# Patient Record
Sex: Male | Born: 1935 | Race: White | Hispanic: No | Marital: Married | Smoking: Former smoker
Health system: Southern US, Community
[De-identification: ages and names within clinical notes are randomized; demographics above are authoritative.]

## PROBLEM LIST (undated history)

## (undated) DIAGNOSIS — K5730 Diverticulosis of large intestine without perforation or abscess without bleeding: Secondary | ICD-10-CM

## (undated) DIAGNOSIS — G309 Alzheimer's disease, unspecified: Secondary | ICD-10-CM

## (undated) DIAGNOSIS — I4891 Unspecified atrial fibrillation: Secondary | ICD-10-CM

## (undated) DIAGNOSIS — I639 Cerebral infarction, unspecified: Secondary | ICD-10-CM

## (undated) DIAGNOSIS — N400 Enlarged prostate without lower urinary tract symptoms: Secondary | ICD-10-CM

## (undated) DIAGNOSIS — I4892 Unspecified atrial flutter: Secondary | ICD-10-CM

## (undated) DIAGNOSIS — F0280 Dementia in other diseases classified elsewhere without behavioral disturbance: Secondary | ICD-10-CM

## (undated) HISTORY — DX: Diverticulosis of large intestine without perforation or abscess without bleeding: K57.30

## (undated) HISTORY — DX: Unspecified atrial flutter: I48.92

## (undated) HISTORY — DX: Benign prostatic hyperplasia without lower urinary tract symptoms: N40.0

## (undated) HISTORY — PX: OTHER SURGICAL HISTORY: SHX169

---
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
## 1976-04-19 HISTORY — PX: KIDNEY STONE SURGERY: SHX686

## 2002-06-07 HISTORY — PX: OTHER SURGICAL HISTORY: SHX169

## 2002-06-18 ENCOUNTER — Encounter: Payer: Self-pay | Admitting: Family Medicine

## 2003-06-15 ENCOUNTER — Emergency Department (HOSPITAL_COMMUNITY): Admission: AD | Admit: 2003-06-15 | Discharge: 2003-06-15 | Payer: Self-pay | Admitting: Family Medicine

## 2005-06-07 ENCOUNTER — Ambulatory Visit: Payer: Self-pay | Admitting: Family Medicine

## 2005-06-10 ENCOUNTER — Ambulatory Visit: Payer: Self-pay | Admitting: Family Medicine

## 2005-06-24 ENCOUNTER — Ambulatory Visit: Payer: Self-pay | Admitting: Family Medicine

## 2005-06-24 LAB — FECAL OCCULT BLOOD, GUAIAC: Fecal Occult Blood: NEGATIVE

## 2006-02-02 ENCOUNTER — Ambulatory Visit: Payer: Self-pay | Admitting: Family Medicine

## 2006-02-24 ENCOUNTER — Ambulatory Visit: Payer: Self-pay | Admitting: General Surgery

## 2006-02-24 ENCOUNTER — Other Ambulatory Visit: Payer: Self-pay

## 2006-03-02 ENCOUNTER — Ambulatory Visit: Payer: Self-pay | Admitting: Family Medicine

## 2006-03-04 ENCOUNTER — Ambulatory Visit: Payer: Self-pay | Admitting: Cardiology

## 2006-03-08 ENCOUNTER — Ambulatory Visit: Payer: Self-pay | Admitting: General Surgery

## 2006-03-08 HISTORY — PX: COLONOSCOPY: SHX174

## 2006-03-08 HISTORY — PX: HEMORRHOID SURGERY: SHX153

## 2006-04-19 HISTORY — PX: BACK SURGERY: SHX140

## 2006-12-15 ENCOUNTER — Ambulatory Visit: Payer: Self-pay | Admitting: Family Medicine

## 2006-12-15 DIAGNOSIS — K5730 Diverticulosis of large intestine without perforation or abscess without bleeding: Secondary | ICD-10-CM | POA: Insufficient documentation

## 2006-12-15 DIAGNOSIS — M159 Polyosteoarthritis, unspecified: Secondary | ICD-10-CM

## 2006-12-15 DIAGNOSIS — N400 Enlarged prostate without lower urinary tract symptoms: Secondary | ICD-10-CM

## 2006-12-15 LAB — CONVERTED CEMR LAB
Basophils Absolute: 0 10*3/uL (ref 0.0–0.1)
Basophils Relative: 0.5 % (ref 0.0–1.0)
Eosinophils Absolute: 0.3 10*3/uL (ref 0.0–0.6)
Eosinophils Relative: 3.2 % (ref 0.0–5.0)
HCT: 42.5 % (ref 39.0–52.0)
Hemoglobin: 14.6 g/dL (ref 13.0–17.0)
Lymphocytes Relative: 20.6 % (ref 12.0–46.0)
MCV: 92 fL (ref 78.0–100.0)
Monocytes Absolute: 0.9 10*3/uL — ABNORMAL HIGH (ref 0.2–0.7)
Platelets: 303 10*3/uL (ref 150–400)
RBC: 4.62 M/uL (ref 4.22–5.81)
RDW: 12.3 % (ref 11.5–14.6)
Rhuematoid fact SerPl-aCnc: <20 intl units/mL — ABNORMAL LOW (ref 0.0–20.0)
Sed Rate: 16 mm/hr (ref 0–20)

## 2006-12-20 ENCOUNTER — Telehealth (INDEPENDENT_AMBULATORY_CARE_PROVIDER_SITE_OTHER): Payer: Self-pay | Admitting: *Deleted

## 2006-12-20 LAB — CONVERTED CEMR LAB
ANA Titer 1: 1:160 {titer} — ABNORMAL HIGH
Anti Nuclear Antibody(ANA): POSITIVE — AB

## 2007-01-05 ENCOUNTER — Encounter: Payer: Self-pay | Admitting: Family Medicine

## 2007-06-20 ENCOUNTER — Encounter: Payer: Self-pay | Admitting: Family Medicine

## 2007-06-28 ENCOUNTER — Encounter: Payer: Self-pay | Admitting: Rheumatology

## 2007-06-29 ENCOUNTER — Encounter: Payer: Self-pay | Admitting: Family Medicine

## 2007-07-05 ENCOUNTER — Ambulatory Visit: Payer: Self-pay | Admitting: Rheumatology

## 2007-07-07 ENCOUNTER — Other Ambulatory Visit: Payer: Self-pay

## 2007-07-07 ENCOUNTER — Ambulatory Visit: Payer: Self-pay | Admitting: Unknown Physician Specialty

## 2009-02-03 ENCOUNTER — Ambulatory Visit: Payer: Self-pay | Admitting: Family Medicine

## 2009-02-03 LAB — CONVERTED CEMR LAB
ALT: 26 units/L (ref 0–53)
BUN: 19 mg/dL (ref 6–23)
Basophils Absolute: 0 10*3/uL (ref 0.0–0.1)
CO2: 30 meq/L (ref 19–32)
Chloride: 103 meq/L (ref 96–112)
Cholesterol: 163 mg/dL (ref 0–200)
Creatinine, Ser: 1.6 mg/dL — ABNORMAL HIGH (ref 0.4–1.5)
Creatinine,U: 111.4 mg/dL
Eosinophils Relative: 8.2 % — ABNORMAL HIGH (ref 0.0–5.0)
GFR calc non Af Amer: 44.49 mL/min (ref >60)
Glucose, Bld: 101 mg/dL — ABNORMAL HIGH (ref 70–99)
LDL Cholesterol: 101 mg/dL — ABNORMAL HIGH (ref 0–99)
Lymphocytes Relative: 29.1 % (ref 12.0–46.0)
MCHC: 33.4 g/dL (ref 30.0–36.0)
Microalb Creat Ratio: 2.7 mg/g (ref 0.0–30.0)
Microalb, Ur: 0.3 mg/dL (ref 0.0–1.9)
Neutro Abs: 4 10*3/uL (ref 1.4–7.7)
Neutrophils Relative %: 53.4 % (ref 43.0–77.0)
Platelets: 200 10*3/uL (ref 150.0–400.0)
Potassium: 5 meq/L (ref 3.5–5.1)
RBC: 5.06 M/uL (ref 4.22–5.81)
Total Protein: 7.4 g/dL (ref 6.0–8.3)
VLDL: 13.8 mg/dL (ref 0.0–40.0)

## 2009-02-04 ENCOUNTER — Telehealth: Payer: Self-pay | Admitting: Family Medicine

## 2009-02-04 LAB — CONVERTED CEMR LAB: Vit D, 25-Hydroxy: 34 ng/mL (ref 30–89)

## 2009-02-05 ENCOUNTER — Ambulatory Visit: Payer: Self-pay | Admitting: Internal Medicine

## 2009-02-05 ENCOUNTER — Inpatient Hospital Stay (HOSPITAL_COMMUNITY): Admission: EM | Admit: 2009-02-05 | Discharge: 2009-02-07 | Payer: Self-pay | Admitting: Emergency Medicine

## 2009-02-05 ENCOUNTER — Ambulatory Visit: Payer: Self-pay | Admitting: Family Medicine

## 2009-02-05 DIAGNOSIS — R413 Other amnesia: Secondary | ICD-10-CM

## 2009-02-06 ENCOUNTER — Encounter: Payer: Self-pay | Admitting: Internal Medicine

## 2009-02-06 ENCOUNTER — Encounter: Payer: Self-pay | Admitting: Cardiology

## 2009-02-07 HISTORY — PX: ATRIAL FLUTTER ABLATION: SHX5733

## 2009-02-10 ENCOUNTER — Ambulatory Visit: Payer: Self-pay | Admitting: Internal Medicine

## 2009-02-10 ENCOUNTER — Telehealth: Payer: Self-pay | Admitting: Family Medicine

## 2009-02-10 LAB — CONVERTED CEMR LAB: POC INR: 2.8

## 2009-02-14 ENCOUNTER — Ambulatory Visit: Payer: Self-pay | Admitting: Cardiology

## 2009-02-14 LAB — CONVERTED CEMR LAB: POC INR: 3.1

## 2009-02-17 ENCOUNTER — Ambulatory Visit: Payer: Self-pay | Admitting: Internal Medicine

## 2009-02-20 ENCOUNTER — Ambulatory Visit: Payer: Self-pay | Admitting: Cardiology

## 2009-02-20 LAB — CONVERTED CEMR LAB: POC INR: 3

## 2009-03-06 ENCOUNTER — Encounter: Payer: Self-pay | Admitting: Internal Medicine

## 2009-03-17 ENCOUNTER — Ambulatory Visit: Payer: Self-pay | Admitting: Internal Medicine

## 2009-04-07 ENCOUNTER — Telehealth: Payer: Self-pay | Admitting: Family Medicine

## 2009-04-19 HISTORY — PX: TRANSURETHRAL RESECTION OF PROSTATE: SHX73

## 2009-05-06 ENCOUNTER — Encounter: Payer: Self-pay | Admitting: Family Medicine

## 2009-05-20 ENCOUNTER — Encounter: Payer: Self-pay | Admitting: Family Medicine

## 2009-11-20 ENCOUNTER — Encounter (INDEPENDENT_AMBULATORY_CARE_PROVIDER_SITE_OTHER): Payer: Self-pay | Admitting: *Deleted

## 2010-01-28 ENCOUNTER — Telehealth: Payer: Self-pay | Admitting: Family Medicine

## 2010-02-10 ENCOUNTER — Ambulatory Visit: Payer: Self-pay | Admitting: Family Medicine

## 2010-02-10 DIAGNOSIS — E7800 Pure hypercholesterolemia, unspecified: Secondary | ICD-10-CM

## 2010-02-10 LAB — CONVERTED CEMR LAB
ALT: 22 units/L (ref 0–53)
Albumin: 4.2 g/dL (ref 3.5–5.2)
Alkaline Phosphatase: 93 units/L (ref 39–117)
BUN: 15 mg/dL (ref 6–23)
Basophils Absolute: 0.1 10*3/uL (ref 0.0–0.1)
Basophils Relative: 0.6 % (ref 0.0–3.0)
Bilirubin, Direct: 0.1 mg/dL (ref 0.0–0.3)
Eosinophils Absolute: 0.5 10*3/uL (ref 0.0–0.7)
Eosinophils Relative: 5.7 % — ABNORMAL HIGH (ref 0.0–5.0)
GFR calc non Af Amer: 49.72 mL/min (ref >60)
Hemoglobin: 15.2 g/dL (ref 13.0–17.0)
Lymphs Abs: 2.4 10*3/uL (ref 0.7–4.0)
MCHC: 34.1 g/dL (ref 30.0–36.0)
Microalb, Ur: 0.4 mg/dL (ref 0.0–1.9)
Monocytes Absolute: 0.7 10*3/uL (ref 0.1–1.0)
Monocytes Relative: 8.5 % (ref 3.0–12.0)
PSA: 1.63 ng/mL (ref 0.10–4.00)
Platelets: 222 10*3/uL (ref 150.0–400.0)
RBC: 4.64 M/uL (ref 4.22–5.81)
RDW: 12.9 % (ref 11.5–14.6)
Sodium: 138 meq/L (ref 135–145)
TSH: 1.47 microintl units/mL (ref 0.35–5.50)
Total Protein: 6.9 g/dL (ref 6.0–8.3)
Triglycerides: 100 mg/dL (ref 0.0–149.0)
WBC: 8.4 10*3/uL (ref 4.5–10.5)

## 2010-02-12 ENCOUNTER — Ambulatory Visit: Payer: Self-pay | Admitting: Family Medicine

## 2010-02-12 DIAGNOSIS — D409 Neoplasm of uncertain behavior of male genital organ, unspecified: Secondary | ICD-10-CM

## 2010-02-12 DIAGNOSIS — N529 Male erectile dysfunction, unspecified: Secondary | ICD-10-CM

## 2010-02-16 ENCOUNTER — Telehealth: Payer: Self-pay | Admitting: Family Medicine

## 2010-03-30 ENCOUNTER — Encounter: Payer: Self-pay | Admitting: Family Medicine

## 2010-03-30 LAB — CONVERTED CEMR LAB: Creatinine, Ser: 1.42 mg/dL

## 2010-04-06 ENCOUNTER — Ambulatory Visit: Payer: Self-pay | Admitting: Anesthesiology

## 2010-04-07 ENCOUNTER — Ambulatory Visit: Payer: Self-pay | Admitting: General Surgery

## 2010-04-07 ENCOUNTER — Encounter: Payer: Self-pay | Admitting: Family Medicine

## 2010-04-07 HISTORY — PX: HERNIA REPAIR: SHX51

## 2010-04-10 ENCOUNTER — Encounter: Payer: Self-pay | Admitting: Family Medicine

## 2010-05-19 NOTE — Letter (Signed)
Summary: Dr.Chester Haworth,[facility],Note  Dr.Chester Haworth,[facility],Note   Imported By: Beau Fanny 05/21/2009 13:46:37  _____________________________________________________________________  External Attachment:    Type:   Image     Comment:   External Document

## 2010-05-19 NOTE — Letter (Signed)
Summary: Nadara Eaton letter  [place] [facility]  [address] [place], [place] [address]   Phone: [phone]  Fax: [phone]       11/20/2009 MRN: [id number]  [facility] [address] [place], [place]  [address]  Dear Mr. Kennieth Rad,  [facility] [place], and [facility] announce the retirement of Arta Silence, M.D., from full-time practice at the [facility] office effective October 16, 2009 and his plans of returning part-time.  It is important to Dr. Hetty Ely and to our practice that you understand that [facility] has seven physicians in our office for your health care needs.  We will continue to offer the same exceptional care that you have today.    Dr. Hetty Ely has spoken to many of you about his plans for retirement and returning part-time in the fall.   We will continue to work with you through the transition to schedule appointments for you in the office and meet the high standards that [place] [facility] is committed to.   Again, it is with great pleasure that we share the news that Dr. Hetty Ely will return to [facility] in October of 2011 with a reduced schedule.    If you have any questions, or would like to request an appointment with one of our physicians, please call us at [phone] and press the option for Scheduling an appointment.  We take pleasure in providing you with excellent patient care and look forward to seeing you at your next office visit.  Our [facility] Physicians are:  Tillman Abide, M.D. Laurita Quint, M.D. Roxy Manns, M.D. Kerby Nora, M.D. Hannah Beat, M.D. Ruthe Mannan, M.D. We proudly welcomed Raechel Ache, M.D. and Eustaquio Boyden, M.D. to the practice in July/August 2011.  Sincerely,  [facility]

## 2010-05-19 NOTE — Consult Note (Signed)
Summary: Dr.Chester Haworth,[facility],Note  Dr.Chester Haworth,[facility],Note   Imported By: Beau Fanny 05/07/2009 09:31:46  _____________________________________________________________________  External Attachment:    Type:   Image     Comment:   External Document

## 2010-05-19 NOTE — Progress Notes (Signed)
Summary: wants referral to specialist  Phone Note Call from Patient Call back at Home Phone [phone] Call back at [phone]   Caller: Patient Call For: Shaune Leeks MD Summary of Call: Pt says he has a problem with his rectum that you are aware of and he would like to be referred to a specialist about this.  He prefers to see someone in [place]. Initial call taken by: Lowella Petties CMA, AAMA,  February 16, 2010 10:22 AM  Follow-up for Phone Call        Pls ask pt to return to see Dr Lemar Livings again. If he prefers not to go, pls refer to Dr Mechele Collin, GI in [place]. Follow-up by: Shaune Leeks MD,  February 18, 2010 7:15 AM  Additional Follow-up for Phone Call Additional follow up Details #1::        [facility] for pt to call back.             Lowella Petties CMA, AAMA  February 18, 2010 10:53 AM  Advised pt, he agreed to that plan.   Lowella Petties CMA, AAMA  February 18, 2010 12:28 PM

## 2010-05-19 NOTE — Progress Notes (Signed)
Summary: wants PSA checked  Phone Note Call from Patient Call back at Home Phone [phone]   Caller: Patient Call For: Shaune Leeks MD Summary of Call: Pt requests to have his PSA checked with his cpx labs on 10/25. Initial call taken by: Lowella Petties CMA,  January 28, 2010 4:40 PM  Follow-up for Phone Call        Noted. Follow-up by: Shaune Leeks MD,  January 28, 2010 4:49 PM

## 2010-05-19 NOTE — Assessment & Plan Note (Signed)
Summary: CPX   Vital Signs:  Patient profile:   77 year old male Weight:      183 pounds BMI:     24.91 Temp:     97.3 degrees F oral Pulse rate:   76 / minute Pulse rhythm:   regular BP sitting:   110 / 70  (left arm) Cuff size:   large  Vitals Entered By: Sydell Axon LPN (2010-02-20 2:01 PM) CC: 30 Minute medicare physical  Vision Screening:Left eye w/o correction: 20 / 25 Right Eye w/o correction: 20 / 40 Both eyes w/o correction:  20/ 25       25db HL: Left  500 hz: No Response 1000 hz: No Response 2000 hz: No Response 4000 hz: No Response Right  500 hz: 25db 1000 hz: 25db 2000 hz: No Response    History of Present Illness: Pt here for Medicare PE. He had a colonoscopy supposedly by Dr Lemar Livings  last year. I have not gotten that note. He has not had a regular BM since that time. He had significant reaction to the anesthesia. His stomach has lots of loose stools and his stool diameter is very small. He has also been seen by Neurology a few times for dementia and was found to have significant atrophy by head MRI. Was not felt to have Alzheimer's. Has lesion on penis he would like checked. It causes him to have a split stream on urination. He has sold his [employer] recently.  Preventive Screening-Counseling & Management  Alcohol-Tobacco     Alcohol drinks/day: <1     Alcohol type: wine, beer      Smoking Status: quit     Packs/Day: 2/3      Year Started: 1951     Year Quit: 1975     Pack years: 20  Caffeine-Diet-Exercise     Caffeine use/day: 2     Does Patient Exercise: yes     Type of exercise: gym     Exercise (avg: min/session): >60     Times/week: 3-4  Problems Prior to Update: 1)  Hypercholesterolemia  (ICD-272.0) 2)  Memory Loss  (ICD-780.93) 3)  Trochanteric Bursitis, Left  (ICD-726.5) 4)  Hyperglycemia, 104  (ICD-790.29) 5)  Benign Prostatic Hypertrophy  (ICD-600.00) 6)  Diverticulosis, Colon  (ICD-562.10) 7)  Osteoarthritis,  Generalized, Multiple Joints  (ICD-715.09)  Medications Prior to Update: 1)  None  Allergies: No Known Drug Allergies  Past History:  Past Medical History: Last updated: 02/17/2009 Diverticulosis, colon Benign prostatic hypertrophy Aflutter s/p rfca  Past Surgical History: Last updated: 03/27/2009 Anal Fissure [id number] Kidney Stone 1978 MRI INT auit canals wnl except Mastoiditis , left 05/21/02 Audiol eval  06/07/02 Colonoscopy, 2 polyps, divertics, int. ext hemorrhoids    03/08/2006           10 years Hemorrhoidectomy (Byrnett) 03/08/06 BAck Surgery HOSP [facility]   Aflutter Ablation  10/20-10/22/2010 EP Study w/ Ablation 02/06/2009 TEE EF 55% 02/06/2009  Family History: Last updated: 02-20-10 Father: Died at age 95 with pneumonia and arthritis, with a knee replacement Mother: Died at age 72 in a fire Siblings: Two brothers, two sisters, one sister brain tumor/removed CV - none HBP - none DM - none Breast/Ovarian/Uterine cancer - none Depression - none ETOH/Drug Abuse - none Stroke - none  Family History Thyroid disease  Social History: Last updated: 12/15/2006 Marital Status: Married Children: 3 Occupation: Landscape architect in [place]  Risk Factors: Alcohol Use: <1 (February 20, 2010) Caffeine Use: 2 (Feb 20, 2010) Exercise:  yes (02/12/2010)  Risk Factors: Smoking Status: quit (02/12/2010) Packs/Day: 2/3  (02/12/2010)  Family History: Father: Died at age 110 with pneumonia and arthritis, with a knee replacement Mother: Died at age 74 in a fire Siblings: Two brothers, two sisters, one sister brain tumor/removed CV - none HBP - none DM - none Breast/Ovarian/Uterine cancer - none Depression - none ETOH/Drug Abuse - none Stroke - none  Family History Thyroid disease  Review of Systems General:  Denies chills, fatigue, fever, sweats, weakness, and weight loss. Eyes:  Denies blurring, discharge, and eye pain. ENT:  Complains of decreased hearing;  denies earache and ringing in ears. CV:  Denies chest pain or discomfort, difficulty breathing while lying down, fainting, fatigue, palpitations, shortness of breath with exertion, swelling of feet, and swelling of hands. Resp:  Complains of cough; denies shortness of breath and wheezing; with PND. GI:  Denies abdominal pain, bloody stools, change in bowel habits, constipation, dark tarry stools, diarrhea, indigestion, loss of appetite, nausea, vomiting, vomiting blood, and yellowish skin color; see HPI, not hsard stools, not soft but smaller loose BMs.. GU:  Denies discharge, dysuria, nocturia, and urinary frequency. MS:  Denies joint pain, joint swelling, muscle, and cramps; some muscle discomfort with arthritis...evaled by Dr Gavin Potters.. Derm:  Denies dryness, itching, and rash. Neuro:  Denies numbness, poor balance, tingling, and tremors.  Physical Exam  General:  Well-developed,well-nourished,in no acute distress; alert,appropriate and cooperative throughout examination, speaks in what often sounds like jibberish but gets his point across. Head:  Normocephalic and atraumatic without obvious abnormalities. No apparent alopecia or balding. Eyes:  Conjunctiva clear bilaterally.  Ears:  External ear exam shows no significant lesions or deformities.  Otoscopic examination reveals clear canals, tympanic membranes are intact bilaterally without bulging, retraction, inflammation or discharge. Hearing is grossly normal bilaterally. Nose:  External nasal examination shows no deformity or inflammation. Nasal mucosa are pink and moist without lesions or exudates. Mouth:  Oral mucosa and oropharynx without lesions or exudates.  Teeth in good repair. Neck:  No deformities, masses, or tenderness noted. Chest Wall:  No deformities, masses, tenderness or gynecomastia noted. Breasts:  No masses or gynecomastia noted Lungs:  Normal respiratory effort, chest expands symmetrically. Lungs are clear to  auscultation, no crackles or wheezes. Heart:  Increased, very fast rate when ausculta5ted but nml rate whern checked in. S1 and S2, no detectable murmur to my ear. Abdomen:  Bowel sounds positive,abdomen soft and non-tender without masses, organomegaly or hernias noted. Rectal:  No external abnormalities noted. Normal sphincter tone. No rectal masses or tenderness. G neg. Genitalia:  Testes bilaterally descended without nodularity, tenderness or masses. No scrotal masses or lesions. No penis lesions or urethral discharge. Urethra with "string" or adhesion across the meatus. No retraction seen. Msk:  No deformity or scoliosis noted of thoracic or lumbar spine.  Mild decreased ROM shoulders. Pulses:  R and L carotid,radial,femoral,dorsalis pedis and posterior tibial pulses are full and equal bilaterally Extremities:  No clubbing, cyanosis, edema, or deformity noted with normal full range of motion of all joints.   Neurologic:  No cranial nerve deficits noted. Station and gait are normal. Sensory, motor and coordinative functions appear intact. Memory appears intact but suspect at times. He seems confused with word finding or thoughtful flow at times but generally is understandable. Skin:  Intact without suspicious lesions or rashes Cervical Nodes:  No lymphadenopathy noted Inguinal Nodes:  No significant adenopathy Psych:  Cognition and judgment appear intact. Alert and cooperative with normal  attention span and concentration. No apparent delusions, illusions, hallucinations   Impression & Recommendations:  Problem # 1:  HEALTH SCREENING (ICD-V70.0)  Reviewed preventive care protocols, scheduled due services, and updated immunizations. I have personally reviewed the Medicare Annual Wellness questionnaire and have noted 1.   The patient's medical and social history 2.   Their use of alcohol, tobacco or illicit drugs 3.   Their current medications and supplements 4.   The patient's functional  ability including ADL's, fall risks, home safety risks and hearing or visual             impairment. 5.   Diet and physical activities 6.   Evidence for depression or mood disorders  Orders: Medicare -1st Annual Wellness Visit [id number])  Problem # 2:  PENILE LESION (ICD-236.6) Assessment: New New complaint but old problem  in that he had this upon admission physical to the service but had nothing done about it. Would like to have something done now. 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 Will try Viagra. His updated medication list for this problem includes:    Viagra 100 Mg Tabs (Sildenafil citrate) .[facility]... 1/2-1 tab by mouth 1 hour before desired intercourse  Orders: Prescription Created Electronically [phone])  Problem # 4:  HYPERCHOLESTEROLEMIA (ICD-272.0) Assessment: Unchanged Adequate. Labs Reviewed: SGOT: 32 (02/10/2010)   SGPT: 22 (02/10/2010)   HDL:50.10 (02/10/2010), 48.10 (02/03/2009)  LDL:94 (02/10/2010), 101 (84/13/2440)  Chol:164 (02/10/2010), 163 (02/03/2009)  Trig:100.0 (02/10/2010), 69.0 (02/03/2009)  Problem # 5:  MEMORY LOSS (ICD-780.93) Assessment: Unchanged Seems stable.  Problem # 6:  HYPERGLYCEMIA, 104 (ICD-790.29) Assessment: Improved  Normalized today. Will remove from problem list as glucose very nml today.  Labs Reviewed: Creat: 1.5 (02/10/2010)     Problem # 7:  BENIGN PROSTATIC HYPERTROPHY (ICD-600.00) Assessment: Unchanged Acceptable sxs.   Complete Medication List: 1)  Namenda 10 Mg Tabs (Memantine hcl) .... Take one by mouth two times a day 2)  Donepezil Hcl 10 Mg Tabs (Donepezil hcl) .... Take one by mouth daily 3)  Viagra 100 Mg Tabs (Sildenafil citrate) .... 1/2-1 tab by mouth 1 hour before desired intercourse  Other Orders: Audiometry [phone]) Vision Screening (53664)  Patient Instructions: 1)  Try Citrucel  1 tsp every AM in 8 oz of water.  2)  Refer to Urology. 3)  Suggest getting Audology eval. Prescriptions: VIAGRA 100 MG TABS (SILDENAFIL CITRATE) 1/2-1 tab by mouth 1 hour before desired intercourse  #10 x 12   Entered and Authorized by:   Shaune Leeks MD   Signed by:   Shaune Leeks MD on 02/12/2010   Method used:   Electronically to        [facility]       [address]       [place], [place]  [address]       Ph: [phone]       Fax: [phone]   RxID:   [id number]    Orders Added: 1)  Audiometry [92552] 2)  Vision Screening [35573] 3)  Urology Referral [Urology] 4)  Medicare -1st Annual Wellness Visit [G0438] 5)  Prescription Created Electronically [G8553] 6)  Medicare -1st Annual Wellness Visit [G0438]    Current Allergies (reviewed today): No known allergies   Appended Document: CPX Pt declines hearing problem altho machine documented no hearing on the left, decreased  significantly on the right. He could hear adequately while in the exam room with ears covered at different times.

## 2010-05-21 NOTE — Op Note (Signed)
Summary: Excision of hemorrhoidal complex of the 2:00 and 7:00 positions,  Excision of hemorrhoidal complex of the 2:00 and 7:00 positions,[facility]   Imported By: Beau Fanny 04/08/2010 15:10:43  _____________________________________________________________________  External Attachment:    Type:   Image     Comment:   External Document  Appended Document: Excision of hemorrhoidal complex of the 2:00 and 7:00 positions,    Clinical Lists Changes  Observations: Added new observation of PAST SURG HX: Anal Fissure [id number] Kidney Stone 1978 MRI INT auit canals wnl except Mastoiditis , left 05/21/02 Audiol eval  06/07/02 Colonoscopy, 2 polyps, divertics, int. ext hemorrhoids    03/08/2006           10 years Hemorrhoidectomy (Byrnett) 03/08/06 BAck Surgery HOSP [facility]   Aflutter Ablation  10/20-10/22/2010 EP Study w/ Ablation 02/06/2009 TEE EF 55% 02/06/2009 HEMM EXCISION (DR BYRNETT) 04/07/2010 (04/08/2010 22:03)       Past Surgical History:    Anal Fissure [id number]    Kidney Stone 1978    MRI INT auit canals wnl except Mastoiditis , left 05/21/02    Audiol eval  06/07/02    Colonoscopy, 2 polyps, divertics, int. ext hemorrhoids    03/08/2006           10 years    Hemorrhoidectomy (Byrnett) 03/08/06    BAck Surgery    HOSP [facility]   Aflutter Ablation  10/20-10/22/2010    EP Study w/ Ablation 02/06/2009    TEE EF 55% 02/06/2009    HEMM EXCISION (DR BYRNETT) 04/07/2010

## 2010-07-23 LAB — APTT: aPTT: 30 seconds (ref 24–37)

## 2010-07-23 LAB — COMPREHENSIVE METABOLIC PANEL
AST: 34 U/L (ref 0–37)
BUN: 18 mg/dL (ref 6–23)
Calcium: 9.2 mg/dL (ref 8.4–10.5)
Chloride: 107 mEq/L (ref 96–112)
GFR calc Af Amer: >60 mL/min (ref >60)
GFR calc non Af Amer: 54 mL/min — ABNORMAL LOW (ref >60)
Total Bilirubin: 0.6 mg/dL (ref 0.3–1.2)

## 2010-07-23 LAB — DIFFERENTIAL
Basophils Absolute: 0.1 10*3/uL (ref 0.0–0.1)
Basophils Relative: 1 % (ref 0–1)
Eosinophils Absolute: 0.4 10*3/uL (ref 0.0–0.7)
Eosinophils Relative: 4 % (ref 0–5)
Monocytes Absolute: 0.6 10*3/uL (ref 0.1–1.0)
Neutro Abs: 5.5 10*3/uL (ref 1.7–7.7)

## 2010-07-23 LAB — CBC
HCT: 45.2 % (ref 39.0–52.0)
Hemoglobin: 15.4 g/dL (ref 13.0–17.0)
MCHC: 34.4 g/dL (ref 30.0–36.0)
MCV: 95 fL (ref 78.0–100.0)
RBC: 4.76 MIL/uL (ref 4.22–5.81)
RDW: 13.7 % (ref 11.5–15.5)
WBC: 8.9 10*3/uL (ref 4.0–10.5)
WBC: 9.3 10*3/uL (ref 4.0–10.5)

## 2010-07-23 LAB — CARDIAC PANEL(CRET KIN+CKTOT+MB+TROPI)
CK, MB: 8.9 ng/mL — ABNORMAL HIGH (ref 0.3–4.0)
Relative Index: 1.7 (ref 0.0–2.5)
Total CK: 366 U/L — ABNORMAL HIGH (ref 7–232)
Troponin I: 0.02 ng/mL (ref 0.00–0.06)

## 2010-07-23 LAB — PROTIME-INR
INR: 1.09 (ref 0.00–1.49)
INR: 1.15 (ref 0.00–1.49)
Prothrombin Time: 14 seconds (ref 11.6–15.2)
Prothrombin Time: 14.3 seconds (ref 11.6–15.2)
Prothrombin Time: 14.6 seconds (ref 11.6–15.2)

## 2010-07-23 LAB — MAGNESIUM: Magnesium: 2.3 mg/dL (ref 1.5–2.5)

## 2010-09-04 NOTE — Letter (Signed)
February 02, 2006     Kelly Lloyd  [address], [address]  [place], [place] [address]   RE:  Kelly, Lloyd  MRN:  [id number]  /  DOB:  1928-01-19   Dear [name]:   This is to introduce Kelly Lloyd, a 77 year old white male who is  excruciatingly healthy but has problems with external hemorrhoids which  causes routine discharge and he would like it evaluated and possibly  treated.  It has been bothering him for quite some time.  He is a very avid  motorcycle rider, has been to [place] recently in [place].   PAST MEDICAL HISTORY:  1. BPH.  2. Elevated cholesterol.  3. Mildly elevated glucose.   MEDICATIONS:  He is on no medications regularly.  He is very healthy and  stays active.   ALLERGIES:  NO KNOWN DRUG ALLERGIES.   PHYSICAL EXAMINATION:  He has what appears to be deflated external  hemorrhoidal tissue at the 12 o'clock position.  Otherwise, normal.  He has  had an acute bout of gastroenteritis recently which is now resolved.  He has  no other medical problems of which I am aware.           I appreciate you seeing him and look forward to your evaluation.   Sincerely,     ______________________________  Arta Silence, MD   RNS/MedQ  /  Job #:  [id number]  DD:  02/02/2006 / DT:  02/03/2006

## 2010-09-04 NOTE — Assessment & Plan Note (Signed)
[facility] OFFICE NOTE   NAME:Rabold, CASSEY HURRELL                       MRN:          [id number]  DATE:03/04/2006                            DOB:          12-13-1927    REFERRING PHYSICIAN:  Adela Glimpse   PRIMARY CARE PHYSICIAN:  Arta Silence, M.D.   REASON FOR VISIT:  Preoperative evaluation with abnormal electrocardiogram.   HISTORY OF PRESENT ILLNESS:  Mr. Buckman is a pleasant 77 year old male with  no reported major medical conditions who is being considered for a  colonoscopy with possible hemorrhoidectomy, perhaps requiring some surgical  intervention.  He tells me that general anesthesia is not planned, however.  As part of his preoperative assessment he had an electrocardiogram which  shows sinus rhythm with a right bundle branch block pattern, nonspecific ST  changes, and occasional premature ventricular complexes.  I do not have any  old tracings but Mr. Doyon states that in the past these have been  completely normal.  He denies any history of cardiovascular disease or  myocardial infarction and is asymptomatic at this time.  He is very active,  exercising 3-5 days a week at [employer].  He does 45-minute workouts on the  treadmill, walking at least 3 miles at a time at a brisk pace and also  working out with weights.  He denies any exertional chest pain,  palpitations, or limiting dyspnea.   ALLERGIES:  NO KNOWN DRUG ALLERGIES.   MEDICATIONS:  None.   PAST MEDICAL HISTORY:  Is as outlined in the history of present illness.  He  had remote hemorrhoid surgery back in [facility] at the [facility].   SOCIAL HISTORY:  The patient is married.  He has 3 children.  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  He has a remote tobacco use history,  having quit tobacco use back in 1975.   FAMILY HISTORY:  Noncontributory for premature cardiovascular disease.  The  patient's father died at age 66.   REVIEW OF SYSTEMS:  As described in the history of present illness.  He has  had no problems with claudication, palpitations, syncope, orthopnea, PND,  lower extremity edema.   PHYSICAL EXAMINATION:  VITAL SIGNS:  Blood pressure today 132/70, heart rate  62, weight 191 pounds.  GENERAL:  The patient is comfortable and in no acute distress.  Appearing  younger than stated age.  HEENT:  Conjunctivae and lids normal.  Oropharynx is clear.  NECK:  Supple without elevated jugular venous pressure, loud bruits.  No  thyromegaly is noted.  LUNGS:  Clear without labored breathing at rest.  CARDIAC:  Reveals a regular rate and rhythm without S3, gallop, or loud  systolic murmur.  There is no pericardial rub.  ABDOMEN:  Soft, normoactive bowel sounds.  No hepatomegaly.  No tenderness.  EXTREMITIES:  Exhibit no significant clubbing, cyanosis, or edema.  Distal  pulses are 2+.  SKIN:  No ulcerative changes are noted.  MUSCULOSKELETAL:  No kyphosis is noted.  NEUROPSYCHIATRIC:  The patient is alert and oriented x3.  Affect is normal.   IMPRESSION/RECOMMENDATIONS:  1. Preoperative assessment in a healthy-appearing 77 year old male with no      antecedent history of hypertension, hyperlipidemia, type 2 diabetes      mellitus, or cardiovascular disease.  His resting electrocardiogram is      abnormal with a right bundle branch block pattern, nonspecific ST-T      wave changes, and occasional prematures ventricular complexes.  He is      asymptomatic, however, and achieves regular activity exceeding 4 METS.      He is scheduled for a colonoscopy with possible hemorrhoidectomy      requiring some type of surgical intervention, although not under      general anesthesia for this coming Tuesday.  We talked about basis risk      stratification and at this point he is very eager to proceed with  the      planned surgery due to scheduling constraints.  He should have an      acceptably low perioperative cardiac risk based on available      information and our plan at this point will be to add a low dose      perioperative beta-blocker at least for the next few weeks and more      than likely anticipate a followup exercise echocardiogram down the      road.  He was most comfortable with this and I will plan a followup      visit in mid-December to discuss things further.  I would not      anticipate that he absolutely has to have further cardiac testing now      prior to the planned procedure.  2. Blood pressure elevated today, although no antecedent history of      hypertension.  This can be followed as well.     Jonelle Sidle, MD  Electronically Signed    SGM/MedQ  DD: 03/04/2006  DT: 03/04/2006  Job #: [id number]

## 2010-10-02 ENCOUNTER — Encounter: Payer: Self-pay | Admitting: Cardiovascular Disease

## 2011-01-08 ENCOUNTER — Other Ambulatory Visit: Payer: Self-pay

## 2011-01-08 ENCOUNTER — Other Ambulatory Visit (INDEPENDENT_AMBULATORY_CARE_PROVIDER_SITE_OTHER): Payer: MEDICARE

## 2011-01-08 DIAGNOSIS — N400 Enlarged prostate without lower urinary tract symptoms: Secondary | ICD-10-CM

## 2011-01-08 DIAGNOSIS — K5730 Diverticulosis of large intestine without perforation or abscess without bleeding: Secondary | ICD-10-CM

## 2011-01-08 DIAGNOSIS — E7800 Pure hypercholesterolemia, unspecified: Secondary | ICD-10-CM

## 2011-01-08 LAB — BASIC METABOLIC PANEL
CO2: 29 mEq/L (ref 19–32)
Calcium: 9.4 mg/dL (ref 8.4–10.5)
Chloride: 107 mEq/L (ref 96–112)
Sodium: 143 mEq/L (ref 135–145)

## 2011-01-08 LAB — LIPID PANEL
HDL: 54 mg/dL (ref >39.00)
LDL Cholesterol: 94 mg/dL (ref 0–99)
Total CHOL/HDL Ratio: 3
VLDL: 9.6 mg/dL (ref 0.0–40.0)

## 2011-01-08 LAB — HEPATIC FUNCTION PANEL
Alkaline Phosphatase: 94 U/L (ref 39–117)
Bilirubin, Direct: 0.1 mg/dL (ref 0.0–0.3)
Total Bilirubin: 0.6 mg/dL (ref 0.3–1.2)
Total Protein: 6.6 g/dL (ref 6.0–8.3)

## 2011-01-08 LAB — CBC WITH DIFFERENTIAL/PLATELET
Basophils Absolute: 0 10*3/uL (ref 0.0–0.1)
Eosinophils Absolute: 0.4 10*3/uL (ref 0.0–0.7)
Lymphocytes Relative: 28.9 % (ref 12.0–46.0)
Lymphs Abs: 1.7 10*3/uL (ref 0.7–4.0)
Monocytes Relative: 7.8 % (ref 3.0–12.0)
Platelets: 199 10*3/uL (ref 150.0–400.0)
RDW: 13.3 % (ref 11.5–14.6)

## 2011-01-08 LAB — TSH: TSH: 1.55 u[IU]/mL (ref 0.35–5.50)

## 2011-01-12 ENCOUNTER — Encounter: Payer: Self-pay | Admitting: Family Medicine

## 2011-01-13 ENCOUNTER — Encounter: Payer: Self-pay | Admitting: Family Medicine

## 2011-01-13 ENCOUNTER — Ambulatory Visit (INDEPENDENT_AMBULATORY_CARE_PROVIDER_SITE_OTHER): Payer: MEDICARE | Admitting: Family Medicine

## 2011-01-13 DIAGNOSIS — N400 Enlarged prostate without lower urinary tract symptoms: Secondary | ICD-10-CM

## 2011-01-13 DIAGNOSIS — K5730 Diverticulosis of large intestine without perforation or abscess without bleeding: Secondary | ICD-10-CM

## 2011-01-13 DIAGNOSIS — K6289 Other specified diseases of anus and rectum: Secondary | ICD-10-CM | POA: Insufficient documentation

## 2011-01-13 DIAGNOSIS — E7800 Pure hypercholesterolemia, unspecified: Secondary | ICD-10-CM

## 2011-01-13 DIAGNOSIS — Z0000 Encounter for general adult medical examination without abnormal findings: Secondary | ICD-10-CM

## 2011-01-13 DIAGNOSIS — R413 Other amnesia: Secondary | ICD-10-CM

## 2011-01-13 NOTE — Patient Instructions (Signed)
See Dr Lemar Livings as discussed. RTC one year.  Flu shot mid Oct.

## 2011-01-13 NOTE — Assessment & Plan Note (Signed)
Stable. Discussed coming in for prolonged LLQ discomfort.

## 2011-01-13 NOTE — Progress Notes (Signed)
Subjective:    Patient ID: Kelly Lloyd, male    DOB: [birth year], 77 y.o.   MRN: [id number]  HPI Pt here for Comp Exam. He feels well and has complaints of his back hurting and has difficulty getting BMs out. He has had surgery for hemmorhoids and has had difficulty with stools since. He has not seen Dr Lemar Livings since Jan, when he was having pain then. He has not been seen since.  \ He had a colonoscopy about three years ago. He was told to repeat in 10 years.    Review of Systems  Constitutional: Negative for fever, chills, diaphoresis, appetite change, fatigue and unexpected weight change.  HENT: Negative for hearing loss, ear pain, tinnitus and ear discharge.   Eyes: Negative for pain, discharge and visual disturbance.       Sees Dr Dingledein regularly.   Respiratory: Negative for cough, shortness of breath and wheezing.   Cardiovascular: Negative for palpitations.       No SOB w/ exertion  Gastrointestinal: Negative for nausea, vomiting, abdominal pain, diarrhea, constipation and blood in stool.       No heartburn or swallowing problems.  Genitourinary: Negative for dysuria, frequency and difficulty urinating.       No nocturia  Musculoskeletal: Negative for myalgias, back pain and arthralgias.  Skin: Negative for rash.       No itching or dryness.  Neurological: Negative for tremors and numbness.       No tingling or balance problems.  Hematological: Negative for adenopathy. Does not bruise/bleed easily.  Psychiatric/Behavioral: Negative for dysphoric mood and agitation.       Objective:   Physical Exam  Constitutional: He is oriented to person, place, and time. He appears well-developed and well-nourished. No distress.  HENT:  Head: Normocephalic and atraumatic.  Right Ear: External ear normal.  Left Ear: External ear normal.  Nose: Nose normal.  Mouth/Throat: Oropharynx is clear and moist.  Eyes: Conjunctivae and EOM are normal. Pupils are equal, round, and reactive  to light. Right eye exhibits no discharge. Left eye exhibits no discharge. No scleral icterus.  Neck: Normal range of motion. Neck supple. No thyromegaly present.  Cardiovascular: Normal rate, regular rhythm, normal heart sounds and intact distal pulses.   No murmur heard. Pulmonary/Chest: Effort normal and breath sounds normal. No respiratory distress. He has no wheezes.  Abdominal: Soft. Bowel sounds are normal. He exhibits no distension and no mass. There is no tenderness. There is no rebound and no guarding.  Genitourinary: Penis normal.       Declines rectal as he has rectal stenosis and has had a colonoscopy and sees Dr Artis Flock (Urology) regularly.  Musculoskeletal: Normal range of motion. He exhibits no edema.  Lymphadenopathy:    He has no cervical adenopathy.  Neurological: He is alert and oriented to person, place, and time. Coordination normal.  Skin: Skin is warm and dry. No rash noted. He is not diaphoretic.  Psychiatric: He has a normal mood and affect. His behavior is normal. Judgment and thought content normal.          Assessment & Plan:  HMPE  I have personally reviewed the Medicare Annual Wellness questionnaire and have noted 1. The patient's medical and social history 2. Their use of alcohol, tobacco or illicit drugs 3. Their current medications and supplements 4. The patient's functional ability including ADL's, fall risks, home safety risks and hearing or visual  Impairment. Has mild dementia and is on two medications. Sees Neuro regularly. 5. Diet and physical activities 6. Evidence for depression or mood disorders

## 2011-01-13 NOTE — Assessment & Plan Note (Signed)
Has had prior surgery and now has some rectal stenosis. He gets uncomfortable with this. Discussed seeing Dr Lemar Livings again in follow up eval.

## 2011-01-13 NOTE — Assessment & Plan Note (Signed)
Lipids acceptable. Cont current diet and exercise.

## 2011-01-13 NOTE — Assessment & Plan Note (Signed)
He thinks slowly progressive. He sees Dr Windle Guard, Neuro every 6 months and is on both dementia medications.

## 2011-01-13 NOTE — Assessment & Plan Note (Signed)
Urination seems to be acceptable. He sees Dr Artis Flock regularly.

## 2011-06-24 ENCOUNTER — Encounter: Payer: Self-pay | Admitting: Family Medicine

## 2011-06-24 DIAGNOSIS — K649 Unspecified hemorrhoids: Secondary | ICD-10-CM | POA: Insufficient documentation

## 2011-07-08 ENCOUNTER — Ambulatory Visit: Payer: Self-pay | Admitting: General Surgery

## 2011-07-08 LAB — CBC WITH DIFFERENTIAL/PLATELET
Basophil %: 0.4 %
Eosinophil #: 0.3 10*3/uL (ref 0.0–0.7)
HCT: 41.9 % (ref 40.0–52.0)
HGB: 14 g/dL (ref 13.0–18.0)
MCH: 31.9 pg (ref 26.0–34.0)
MCHC: 33.6 g/dL (ref 32.0–36.0)
MCV: 95 fL (ref 80–100)
Monocyte #: 0.9 10*3/uL — ABNORMAL HIGH (ref 0.0–0.7)
Neutrophil #: 7.1 10*3/uL — ABNORMAL HIGH (ref 1.4–6.5)

## 2011-07-08 LAB — BASIC METABOLIC PANEL
Calcium, Total: 9.6 mg/dL (ref 8.5–10.1)
Co2: 31 mmol/L (ref 21–32)
EGFR (Non-African Amer.): >60
Osmolality: 278 (ref 275–301)
Potassium: 4.9 mmol/L (ref 3.5–5.1)

## 2011-07-13 ENCOUNTER — Ambulatory Visit: Payer: Self-pay | Admitting: General Surgery

## 2011-07-19 LAB — PATHOLOGY REPORT

## 2012-01-09 ENCOUNTER — Other Ambulatory Visit: Payer: Self-pay | Admitting: Family Medicine

## 2012-01-09 DIAGNOSIS — E7800 Pure hypercholesterolemia, unspecified: Secondary | ICD-10-CM

## 2012-01-13 ENCOUNTER — Other Ambulatory Visit (INDEPENDENT_AMBULATORY_CARE_PROVIDER_SITE_OTHER): Payer: MEDICARE

## 2012-01-13 DIAGNOSIS — E7800 Pure hypercholesterolemia, unspecified: Secondary | ICD-10-CM

## 2012-01-13 LAB — COMPREHENSIVE METABOLIC PANEL
ALT: 17 U/L (ref 0–53)
AST: 26 U/L (ref 0–37)
Albumin: 4.3 g/dL (ref 3.5–5.2)
Alkaline Phosphatase: 93 U/L (ref 39–117)
Potassium: 4.7 mEq/L (ref 3.5–5.1)
Sodium: 140 mEq/L (ref 135–145)
Total Bilirubin: 0.8 mg/dL (ref 0.3–1.2)
Total Protein: 7.2 g/dL (ref 6.0–8.3)

## 2012-01-13 LAB — LIPID PANEL
LDL Cholesterol: 86 mg/dL (ref 0–99)
Total CHOL/HDL Ratio: 3
VLDL: 10.2 mg/dL (ref 0.0–40.0)

## 2012-01-18 ENCOUNTER — Encounter: Payer: Self-pay | Admitting: Family Medicine

## 2012-01-18 ENCOUNTER — Ambulatory Visit (INDEPENDENT_AMBULATORY_CARE_PROVIDER_SITE_OTHER): Payer: MEDICARE | Admitting: Family Medicine

## 2012-01-18 VITALS — BP 174/70 | HR 68 | Temp 98.0°F | Ht 70.5 in | Wt 181.0 lb

## 2012-01-18 DIAGNOSIS — R0982 Postnasal drip: Secondary | ICD-10-CM

## 2012-01-18 DIAGNOSIS — Z9119 Patient's noncompliance with other medical treatment and regimen: Secondary | ICD-10-CM

## 2012-01-18 DIAGNOSIS — Z0000 Encounter for general adult medical examination without abnormal findings: Secondary | ICD-10-CM

## 2012-01-18 DIAGNOSIS — R413 Other amnesia: Secondary | ICD-10-CM

## 2012-01-18 DIAGNOSIS — N400 Enlarged prostate without lower urinary tract symptoms: Secondary | ICD-10-CM

## 2012-01-18 NOTE — Patient Instructions (Addendum)
I think the coughing will gradually get better.  If it doesn't, then take claritin 10mg  a day.  If still not better, then call me.  Take care.  Eat a healthy diet and we'll recheck your labs in one year.

## 2012-01-19 ENCOUNTER — Encounter: Payer: Self-pay | Admitting: Family Medicine

## 2012-01-19 DIAGNOSIS — Z91199 Patient's noncompliance with other medical treatment and regimen due to unspecified reason: Secondary | ICD-10-CM | POA: Insufficient documentation

## 2012-01-19 DIAGNOSIS — Z0000 Encounter for general adult medical examination without abnormal findings: Secondary | ICD-10-CM | POA: Insufficient documentation

## 2012-01-19 DIAGNOSIS — Z9119 Patient's noncompliance with other medical treatment and regimen: Secondary | ICD-10-CM | POA: Insufficient documentation

## 2012-01-19 DIAGNOSIS — R0982 Postnasal drip: Secondary | ICD-10-CM | POA: Insufficient documentation

## 2012-01-19 NOTE — Assessment & Plan Note (Signed)
Per u rology 

## 2012-01-19 NOTE — Progress Notes (Signed)
I have personally attempted to reviewed the Medicare Annual Wellness questionnaire and have noted 1. The patient's medical and social history 2. Their use of alcohol, tobacco or illicit drugs 3. Their current medications and supplements 4. The patient's functional ability including ADL's, fall risks, home safety risks and hearing or visual             impairment. 5. Diet and physical activities 6. Evidence for depression or mood disorders  The patients weight, height, BMI have been recorded in the chart and visual acuity is per eye clinic.  I have attempted to make referrals, counseling and provided education to the patient based review of the above and I have provided the pt with a written personalized care plan for preventive services.  See scanned forms.  Routine anticipatory guidance given to patient.  See health maintenance. Tetanus 2007 Flu declined.  Shingles declined PNA declined Colonoscopy 2013 Prostate cancer screening per uro, PSA wnl Advance directive- pt has a living will.    Dementia. On meds for dementia. Would not allow interview to characterize his memory loss.  See plan.   He complains of post nasal drip for the last few days w/o fever or cough.  No ear pain.  BPH.  Followed by urology. See scanned forms.    PMH and SH reviewed  Meds, vitals, and allergies reviewed.   ROS: See HPI.  Otherwise negative.    GEN: nad, alert and oriented to year, month HEENT: mucous membranes moist, tm w/o erythema, nasal exam with mild clear rhinorrhea, op w/o erythema NECK: supple w/o LA CV: rrr. PULM: ctab, no inc wob ABD: soft, +bs EXT: no edema SKIN: no acute rash

## 2012-01-19 NOTE — Assessment & Plan Note (Signed)
Per neuro.  See noncompliance discussion.

## 2012-01-19 NOTE — Assessment & Plan Note (Signed)
Pt was not profane or physically abusive during the interview, but he was persistently disagreeable throughout the interview.  He reportedly objected to staff to being weighed or measured for height.  During the interview, he wouldn't allow full assessment of short term memory. "I have a specialist for that."  I explained that it was reasonable to assess short term memory loss, esp since I was meeting him for the first time.  He would recall the first of three objects, but then would object to naming the second or third.  I can't assess if he has a deficit or not at this point.  I re-explained my concern.  He again objected and told me "it was none of my business, I'm just here for regular care."  That would include a flu shot, to which he objected (along with PNA and shingles vaccination).  "I've never been told I need a flu shot."  I cited his last OV with Dr. Hetty Ely with the written instructions for a flu shot.  He denied this.  I again tried to assess his memory, and he was again defensive.    He does understand the point of preventive care (I used the example of a seat belt) and told him about risk reduction through vaccination.  He understood the morbidity/mortality risk but "I don't want a shot because I just don't want it."  He gave no other explanation for his position.   I am unsure if his disposition during the exam is typical for him or if this is due to another cause.  He does appear to be able to make his own decisions, as described above.  I don't have indication of imminent potential for harm or objective findings of dementia that would allow me to directly contact family about his decisions.   At this point, he appears to be a willfully noncompliant patient who is interested in addressing routine preventive care.  >25 min spent with face to face with patient, >50% counseling and/or coordinating care.

## 2012-01-19 NOTE — Assessment & Plan Note (Addendum)
Routine anticipatory guidance given to patient. See health maintenance.  Tetanus 2007  Flu declined.  Shingles declined  PNA declined  Colonoscopy 2013  Prostate cancer screening per uro, PSA wnl  Advance directive- pt has a living will.  Recheck BP was 160/80.  Prev wnl on outside check- see urology notes.  I would not intervene due to risk of resulting hypotension.

## 2012-01-19 NOTE — Assessment & Plan Note (Signed)
Would use otc claritin and f/u prn.

## 2012-08-29 ENCOUNTER — Encounter: Payer: Self-pay | Admitting: General Surgery

## 2012-08-29 ENCOUNTER — Ambulatory Visit (INDEPENDENT_AMBULATORY_CARE_PROVIDER_SITE_OTHER): Payer: Medicare Other | Admitting: General Surgery

## 2012-08-29 VITALS — BP 120/70 | HR 62 | Resp 12 | Ht 72.0 in | Wt 179.0 lb

## 2012-08-29 DIAGNOSIS — K5900 Constipation, unspecified: Secondary | ICD-10-CM

## 2012-08-29 DIAGNOSIS — K649 Unspecified hemorrhoids: Secondary | ICD-10-CM

## 2012-08-29 NOTE — Progress Notes (Signed)
Patient ID: Kelly Lloyd, male   DOB: [birth year], 79 y.o.   MRN: [id number]  Chief Complaint  Patient presents with  . Other    hemorrhoids    HPI Kelly Lloyd is a 79 y.o. male who presents for problems with starting bowel movements. The patient states he didn't have a problem with bowel movements until after his hemorrhoidectomy in 2013. He states he uses citracel in the morning but nothing else to help soften his stools. He states the stools are soft but are to large to pass. He complains for rectal and back pain due to this issue. No other complaints at this time.  HPI  Past Medical History  Diagnosis Date  . Diverticulosis of colon   . Benign prostatic hypertrophy   . Atrial flutter     aflutter s/p RFCA  . Dementia   . BPH (benign prostatic hyperplasia)     Past Surgical History  Procedure Laterality Date  . Anal fissure repair  1956  . Kidney stone surgery  1978  . Mri int  2/204    auit canals wnl except Mastoiditis  . Audiol eval  06/07/02  . Colonoscopy  03/08/06    2 polyps, divertics, int ext hemorrhoids  . Hemorrhoid surgery  03/08/06    Kelly Lloyd  . Back surgery    . Hosp mch  10/2--22/10    aflutter ablation. TEE EF 55% 02/06/09  . Hemm excision  04/07/10    Dr. Lemar Livings  . Transurethral resection of prostate  2011    Family History  Problem Relation Age of Onset  . Stroke Neg Hx     neg hx of CV  . Diabetes Neg Hx     DM; also neg hx of HBP  . Cancer Neg Hx     breast, ovarian, uterine  . Depression Neg Hx   . Alcohol abuse Neg Hx     no drug abuse   . Thyroid disease      family Hx    Social History History  Substance Use Topics  . Smoking status: Former Smoker -- 1.00 packs/day for 24 years    Quit date: 01/12/1974  . Smokeless tobacco: Never Used     Comment: quit in 1975  . Alcohol Use: No     Comment: Has a rare beer once a month at most.    No Known Allergies  Current Outpatient Prescriptions  Medication Sig Dispense Refill  .  donepezil (ARICEPT) 10 MG tablet Take 10 mg by mouth daily.        . Dutasteride-Tamsulosin HCl (JALYN PO) Take by mouth daily. Dr. Artis Flock       . Memantine HCl ER (NAMENDA XR) 28 MG CP24 Take 1 capsule by mouth daily.       No current facility-administered medications for this visit.    Review of Systems Review of Systems  Constitutional: Negative.   Respiratory: Negative.   Cardiovascular: Negative.   Gastrointestinal: Positive for rectal pain.    Blood pressure 120/70, pulse 62, resp. rate 12, height 6' (1.829 m), weight 179 lb (81.194 kg).  Physical Exam Physical Exam  Constitutional: He appears well-developed and well-nourished.   Abdominal examination shows no distention or tenderness.  No inguinal adenopathy. Digital rectal exam shows mild stenosis which easily except the index finger. No bleeding. No palpable masses. Normal prostate.   Data Reviewed Endoscopy was completed. No abnormality of the visualized lower rectum was noted. A small amount of soft stool  was evident. No gross blood appreciated.  Assessment    Mild anal stenosis, no evidence of obstruction to defecation. Straining at stool.    Plan    The patient and his wife were encouraged for him to use a Dulcolax suppository twice a week. He'll do this for one month and then report by phone his progress.       Earline Mayotte 08/30/2012, 7:58 PM

## 2012-08-29 NOTE — Patient Instructions (Addendum)
Patient advised to use over the counter Dulcolax suppositories twice a week to help move the bowels. Patient to call in 1 month to give [place] an update. This patient has nurse's card to call.

## 2012-08-30 ENCOUNTER — Encounter: Payer: Self-pay | Admitting: General Surgery

## 2012-08-30 DIAGNOSIS — K5900 Constipation, unspecified: Secondary | ICD-10-CM | POA: Insufficient documentation

## 2012-10-02 ENCOUNTER — Telehealth: Payer: Self-pay | Admitting: General Surgery

## 2012-10-02 ENCOUNTER — Telehealth: Payer: Self-pay | Admitting: *Deleted

## 2012-10-02 NOTE — Telephone Encounter (Signed)
Patient called in today to give a progress report as instructed. He states he needs to talk directly to Dr. Lemar Livings and is not sure on how he is doing.   Message to Dr. Lemar Livings to call patient as requested.

## 2012-10-02 NOTE — Telephone Encounter (Signed)
Patient call to report 2 x / week dulcolax had not significantly improved his stool function. Difficult to assess by phone if he is having pain, or only small stools (associated w/ mild anal stenosis noted last visit.  Will arrange for a repeat office assessment.   Cc: PCP

## 2012-10-24 ENCOUNTER — Ambulatory Visit (INDEPENDENT_AMBULATORY_CARE_PROVIDER_SITE_OTHER): Payer: Medicare Other | Admitting: General Surgery

## 2012-10-24 ENCOUNTER — Encounter: Payer: Self-pay | Admitting: General Surgery

## 2012-10-24 VITALS — BP 132/80 | HR 54 | Resp 12 | Ht 72.0 in | Wt 176.0 lb

## 2012-10-24 DIAGNOSIS — K5900 Constipation, unspecified: Secondary | ICD-10-CM

## 2012-10-24 NOTE — Patient Instructions (Addendum)
Continue fiber intake with meals

## 2012-10-24 NOTE — Progress Notes (Signed)
Patient ID: Kelly Lloyd, male   DOB: [birth year], 79 y.o.   MRN: [id number]  Chief Complaint  Patient presents with  . Rectal Pain    reassessment of rectal pain    HPI Kelly Lloyd is a 79 y.o. male who presents for a reassessment of rectal pain. Today has been a good day "no pain this morning". He states he had a BM this morning after about 2 days. If he has not had a BM in several days he does complain of lower back pain. In May he tried dulcolax suppositories which may have helped some at first. Uses Citrucel daily.  The patient's wife accompanies him today. She reports she spends a fair amount of time in the toilet, And initially there was some suggestion that his back pain related to straining. The best I can determine from a wall, careful discussion with the patient that the lower back pain is present at all times, but is worsened by straining. He did not obtain significant relief by the regular use of duplex suppositories. He denies any bleeding. He is making use of stool softeners/fiber supplements daily.    HPI  Past Medical History  Diagnosis Date  . Diverticulosis of colon   . Benign prostatic hypertrophy   . Atrial flutter     aflutter s/p RFCA  . Dementia   . BPH (benign prostatic hyperplasia)     Past Surgical History  Procedure Laterality Date  . Anal fissure repair  1956  . Kidney stone surgery  1978  . Mri int  2/204    auit canals wnl except Mastoiditis  . Audiol eval  06/07/02  . Colonoscopy  03/08/06    2 polyps, divertics, int ext hemorrhoids  . Hemorrhoid surgery  03/08/06    [facility]  . Back surgery  2008  . Hosp [facility]ch  10/2--22/10    aflutter ablation. TEE EF 55% 02/06/09  . Hemm excision  04/07/10    Dr. Lemar Livings  . Transurethral resection of prostate  2011    Family History  Problem Relation Age of Onset  . Stroke Neg Hx     neg hx of CV  . Diabetes Neg Hx     DM; also neg hx of HBP  . Cancer Neg Hx     breast, ovarian, uterine  . Depression  Neg Hx   . Alcohol abuse Neg Hx     no drug abuse   . Thyroid disease      family Hx    Social History History  Substance Use Topics  . Smoking status: Former Smoker -- 1.00 packs/day for 24 years    Quit date: 01/12/1974  . Smokeless tobacco: Never Used     Comment: quit in 1975  . Alcohol Use: No     Comment: Has a rare beer once a month at most.    No Known Allergies  Current Outpatient Prescriptions  Medication Sig Dispense Refill  . donepezil (ARICEPT) 10 MG tablet Take 10 mg by mouth daily.        . Dutasteride-Tamsulosin HCl (JALYN PO) Take by mouth daily. Dr. Artis Flock       . Memantine HCl ER (NAMENDA XR) 28 MG CP24 Take 1 capsule by mouth daily.       No current facility-administered medications for this visit.    Review of Systems Review of Systems  Constitutional: Negative.   Respiratory: Negative.   Cardiovascular: Negative.   Gastrointestinal: Negative.  Blood pressure 132/80, pulse 54, resp. rate 12, height 6' (1.829 m), weight 176 lb (79.833 kg).  Physical Exam Physical Exam  Constitutional: He is oriented to person, place, and time. He appears well-developed and well-nourished.  Abdominal: Soft.  Genitourinary: Rectal exam shows internal hemorrhoid (anterior hemorrhoid and scaring posterior).  Neurological: He is alert and oriented to person, place, and time.  Skin: Skin is warm and dry.    Data Reviewed His last colonoscopy in 2013 was notable for a tumor at no of the transverse colon and hyperplastic polyps in the rectum. Hemorrhoidal tissue excised July 13, 2011 showed no evidence of malignancy.  Assessment    Chronic obstipation, questionable recent worsening. Pelvic/low back pain.     Plan    It is difficult considering his mild dementia whether there is a true pathologic process or simply a stress response and local discomfort. Considering no stricture was identified just over a year ago, I do not think that a repeat colonoscopy is  indicated. A barium enema would confirm this, but with his persistent complaint of low back pain related to the constipation, it would not elucidate the presacral space lesion. The patient has had back surgery in the past, but I cannot elucidate any radicular component to his present pain. A CT scan of the abdomen and pelvis will be obtained, and further decisions made based on review of the study.       Kelly Lloyd 10/25/2012, 8:09 PM

## 2012-10-25 ENCOUNTER — Encounter: Payer: Self-pay | Admitting: General Surgery

## 2012-10-25 ENCOUNTER — Telehealth: Payer: Self-pay | Admitting: *Deleted

## 2012-10-25 DIAGNOSIS — R109 Unspecified abdominal pain: Secondary | ICD-10-CM

## 2012-10-25 NOTE — Telephone Encounter (Signed)
Patient and his wife called back and they are aware of all instructions.

## 2012-10-25 NOTE — Telephone Encounter (Signed)
Message for patient to call the office.  Patient has been scheduled for a CT abdomen/pelvis with contrast at [facility] for 10-30-12 at 9:30 am (arrive 9:15 am). Prep: no solid foods for 4 hours prior but may have clear liquids up until exam time, pick up prep kit, and take medication list.

## 2012-10-30 ENCOUNTER — Ambulatory Visit: Payer: Self-pay | Admitting: General Surgery

## 2012-10-31 ENCOUNTER — Telehealth: Payer: Self-pay | Admitting: General Surgery

## 2012-10-31 NOTE — Telephone Encounter (Signed)
I reviewed the CT results with the Mrs. Ravan. She reports that he gets up once a night to urinate and otherwise has had no complaints regarding urinary function. The thickened wall of the bladder may be just secondary to lack of full distention, no lesions were identified.  His large prostate, not surprising based on his age.  Radiologist reported anterior rectal lesion. I suspect this is stool, based on his most recent colonoscopy of his 2 years ago.  To confirm that there is no intraluminal lesion, we'll make arrangements for a rigid sigmoidoscopy in the near future.

## 2012-11-01 ENCOUNTER — Encounter: Payer: Self-pay | Admitting: General Surgery

## 2012-11-29 ENCOUNTER — Ambulatory Visit (INDEPENDENT_AMBULATORY_CARE_PROVIDER_SITE_OTHER): Payer: Medicare Other | Admitting: General Surgery

## 2012-11-29 ENCOUNTER — Encounter: Payer: Self-pay | Admitting: General Surgery

## 2012-11-29 VITALS — BP 100/66 | HR 66 | Resp 12 | Ht 72.0 in | Wt 174.0 lb

## 2012-11-29 DIAGNOSIS — R109 Unspecified abdominal pain: Secondary | ICD-10-CM

## 2012-11-29 NOTE — Progress Notes (Signed)
Patient ID: Kelly Lloyd, male   DOB: [birth year], 79 y.o.   MRN: [id number]  Chief Complaint  Patient presents with  . Procedure    rigid sigmoidoscopy     HPI Kelly Lloyd is a 79 y.o. male.  Patient here today for rigid sigmoidoscopy. The patient had undergone a pelvic CT to assess the source for his pain, and there was a suggestion of an anterior rectal wall mass. His colonoscopy 2 years ago had been unremarkable, and rigid sigmoidoscopy was recommended to confirm no mass lesion was present. States he did do the enema at home. HPI  Past Medical History  Diagnosis Date  . Diverticulosis of colon   . Benign prostatic hypertrophy   . Atrial flutter     aflutter s/p RFCA  . Dementia   . BPH (benign prostatic hyperplasia)     Past Surgical History  Procedure Laterality Date  . Anal fissure repair  1956  . Kidney stone surgery  1978  . Mri int  2/204    auit canals wnl except Mastoiditis  . Audiol eval  06/07/02  . Colonoscopy  03/08/06    2 polyps, divertics, int ext hemorrhoids  . Hemorrhoid surgery  03/08/06    Kelly Lloyd  . Back surgery  2008  . Hosp mch  10/2--22/10    aflutter ablation. TEE EF 55% 02/06/09  . Hemm excision  04/07/10    Dr. Lemar Livings  . Transurethral resection of prostate  2011    Family History  Problem Relation Age of Onset  . Stroke Neg Hx     neg hx of CV  . Diabetes Neg Hx     DM; also neg hx of HBP  . Cancer Neg Hx     breast, ovarian, uterine  . Depression Neg Hx   . Alcohol abuse Neg Hx     no drug abuse   . Thyroid disease      family Hx    Social History History  Substance Use Topics  . Smoking status: Former Smoker -- 1.00 packs/day for 24 years    Quit date: 01/12/1974  . Smokeless tobacco: Never Used     Comment: quit in 1975  . Alcohol Use: No     Comment: Has a rare beer once a month at most.    No Known Allergies  Current Outpatient Prescriptions  Medication Sig Dispense Refill  . donepezil (ARICEPT) 10 MG tablet  Take 10 mg by mouth daily.        . Dutasteride-Tamsulosin HCl (JALYN PO) Take by mouth daily. Dr. Artis Flock       . Memantine HCl ER (NAMENDA XR) 28 MG CP24 Take 1 capsule by mouth daily.       No current facility-administered medications for this visit.    Review of Systems Review of Systems  Constitutional: Negative.   Respiratory: Negative.   Cardiovascular: Negative.     Blood pressure 100/66, pulse 66, resp. rate 12, height 6' (1.829 m), weight 174 lb (78.926 kg).  Physical Exam Physical Exam  Constitutional: He is oriented to person, place, and time. He appears well-developed and well-nourished.  Cardiovascular: Normal rate and regular rhythm.   Pulmonary/Chest: Effort normal and breath sounds normal.  Abdominal: Soft.  Neurological: He is alert and oriented to person, place, and time.  Skin: Skin is warm.    Data Reviewed The rigid sigmoidoscope was advanced to 25 cm without difficulty. No mucosal lesions were identified. No evidence of submucosal lesions.  There is some scarring at the anorectal junction from his previous hemorrhoidectomy. Somewhat fibrotic tissue is noted posteriorly. The anus does dilate to 2 cm.  Assessment    Pelvic pain, unclear etiology.  Mild anal stricture.      Plan    I don't have any good recommendations for the management of the patient reported discomfort. I suggested that if he would like to proceed with [facility] assessment this will be arranged. He'll discuss this with his wife notify the office of how it would like to proceed. He was reassured that the smaller stools he is experiencing are related to the anorectal process and nothing else.       Earline Mayotte 11/29/2012, 12:48 PM

## 2012-11-29 NOTE — Patient Instructions (Addendum)
The patient is aware to call back for any questions or concerns.  Consider [facility] referral and let us know  his plans.

## 2014-03-20 ENCOUNTER — Encounter: Payer: Self-pay | Admitting: *Deleted

## 2014-03-20 ENCOUNTER — Telehealth: Payer: Self-pay | Admitting: *Deleted

## 2014-03-20 NOTE — Telephone Encounter (Signed)
appt made at pt request. I talked with him about the last office visit about referral to [facility] and he wants to see Dr Bary Castilla first.The patient wants to discuss his options.

## 2014-04-16 ENCOUNTER — Encounter: Payer: Self-pay | Admitting: General Surgery

## 2014-04-16 ENCOUNTER — Ambulatory Visit (INDEPENDENT_AMBULATORY_CARE_PROVIDER_SITE_OTHER): Payer: Medicare Other | Admitting: General Surgery

## 2014-04-16 VITALS — BP 122/78 | HR 74 | Resp 12 | Ht 72.0 in | Wt 166.0 lb

## 2014-04-16 DIAGNOSIS — K624 Stenosis of anus and rectum: Secondary | ICD-10-CM

## 2014-04-16 DIAGNOSIS — K6289 Other specified diseases of anus and rectum: Secondary | ICD-10-CM

## 2014-04-16 LAB — POC HEMOCCULT BLD/STL (OFFICE/1-CARD/DIAGNOSTIC): Card #1 Date: NEGATIVE

## 2014-04-16 NOTE — Patient Instructions (Addendum)
The patient is aware to call back for any questions or concerns. He will call back with where he would like to be referred.

## 2014-04-16 NOTE — Progress Notes (Addendum)
Patient ID: Kelly Lloyd, male   DOB: [birth year], 80 y.o.   MRN: [id number]  Chief Complaint  Patient presents with  . Follow-up    anal stricture    HPI Kelly Lloyd is a 80 y.o. male.  Here today for follow up anal stricture and discuss options for referral. Bowels move every 2 days and some times 3 days., He states normal and no blood.  He does admit to occasionally watery stools. He does complain to his wife about abdominal pain and lower back about every other week. Wife concerned about his weight loss, appetite is still good. She states his weight at home was 178 lb at home and now 158 lb over the past 3 months. This weight loss is not documented in our office records.  He does report the need to strain with bowel movements. At times, he reports the need to defecate, but then experiences no rectal output. No episodes of incontinence. Physical activity does not seem to affect his pain, they go to the gym three days a week. Weight down 8 lb since August. The patient's wife reports that Kelly Lloyd discharged his primary physician for some perceived slight. At this time he does not have a primary care doctor.   HPI  Past Medical History  Diagnosis Date  . Diverticulosis of colon   . Benign prostatic hypertrophy   . Atrial flutter     aflutter s/p RFCA  . Dementia   . BPH (benign prostatic hyperplasia)     Past Surgical History  Procedure Laterality Date  . Anal fissure repair  1956  . Kidney stone surgery  1978  . Mri int  2/204    auit canals wnl except Mastoiditis  . Audiol eval  06/07/02  . Colonoscopy  03/08/06    2 polyps, divertics, int ext hemorrhoids  . Hemorrhoid surgery  03/08/06    [facility]  . Back surgery  2008  . Hosp [facility]  10/2--22/10    aflutter ablation. TEE EF 55% 02/06/09  . Hemm excision  04/07/10    Dr. Bary Castilla  . Transurethral resection of prostate  2011    Family History  Problem Relation Age of Onset  . Stroke Neg Hx     neg hx of CV  .  Diabetes Neg Hx     DM; also neg hx of HBP  . Cancer Neg Hx     breast, ovarian, uterine  . Depression Neg Hx   . Alcohol abuse Neg Hx     no drug abuse   . Thyroid disease      family Hx    Social History History  Substance Use Topics  . Smoking status: Former Smoker -- 1.00 packs/day for 24 years    Quit date: 01/12/1974  . Smokeless tobacco: Never Used     Comment: quit in 1975  . Alcohol Use: No     Comment: Has a rare beer once a month at most.    No Known Allergies  Current Outpatient Prescriptions  Medication Sig Dispense Refill  . donepezil (ARICEPT) 10 MG tablet Take 10 mg by mouth daily.      . Dutasteride-Tamsulosin HCl (JALYN PO) Take by mouth daily. Dr. Rogers Blocker     . Memantine HCl ER (NAMENDA XR) 28 MG CP24 Take 1 capsule by mouth daily.     No current facility-administered medications for this visit.    Review of Systems Review of Systems  Constitutional: Negative.   Respiratory:  Negative.   Cardiovascular: Negative.   Gastrointestinal: Negative for diarrhea and constipation.    Blood pressure 122/78, pulse 74, resp. rate 12, height 6' (1.829 m), weight 166 lb (75.297 kg).  Physical Exam Physical Exam  Constitutional: He appears well-developed and well-nourished.  Neck: Neck supple.  Cardiovascular: Normal rate, regular rhythm and normal heart sounds.   Pulmonary/Chest: Effort normal and breath sounds normal.  Abdominal: Bowel sounds are normal. There is no hepatosplenomegaly. There is no tenderness. There is no CVA tenderness. No hernia.  Genitourinary: Guaiac negative stool.     Anal stricture  Musculoskeletal:       Back:  Tender right back area  Lymphadenopathy:    He has no cervical adenopathy.       Right: No inguinal adenopathy present.       Left: No inguinal adenopathy present.  Neurological: He is alert.  The patient shows a marked deterioration in his speech pattern. He is able to follow commands and follow along her discussion,  but his ability to express himself is markedly declined since his evaluation 4 months ago.  Skin: Skin is warm and dry.    Data Reviewed CT of the abdomen and pelvis dated 11/02/2013 had suggested anterior rectal mass. Rigid sigmoidoscopy completed after that scan was negative.  Assessment    Sensation of difficult elimination, low back pain.    Plan    Opportunity for university or [place] assessment was again discussed. The patient is unclear how he would like to proceed. He R his wife will contact the office if they would desire outside second opinion. At this time I do not see any additional diagnostic testing that would be of benefit.       He will call back with where he would like to be referred.  PCP: none  Robert Bellow 04/23/2014, 8:48 AM

## 2014-04-17 DIAGNOSIS — K624 Stenosis of anus and rectum: Secondary | ICD-10-CM | POA: Insufficient documentation

## 2014-04-17 DIAGNOSIS — K6289 Other specified diseases of anus and rectum: Secondary | ICD-10-CM | POA: Insufficient documentation

## 2014-04-18 ENCOUNTER — Telehealth: Payer: Self-pay | Admitting: *Deleted

## 2014-04-18 NOTE — Telephone Encounter (Signed)
Pts wife called and wanted to let you know they made their decision and doesn't wont to go to [facility] or [facility]. Pts wife wants you to call her she has a couple of questions, one was about a primary doctor and the other question was "what would you do if this was your spouse having these problems".

## 2014-04-22 NOTE — Telephone Encounter (Signed)
I had a long discussion with the patient's wife. I'm happy to make a university referral, but I think it's unlikely additional therapeutic measures will be of benefit. The patient's mental Over the last 4 months is profound, and if an additional answer was not provided, he would be no more satisfied that he is at present.  She was notified that the [place] clinic at [facility] is taking new patients, and this may be of use to her. She should plan to keep his regular appointments with the neurologist who is providing his medications for Alzheimer's presently scheduled for February.

## 2014-08-11 NOTE — Op Note (Signed)
PATIENT NAME:  Kelly Lloyd, Kelly Lloyd MR#:  [id number] DATE OF BIRTH:  1927-11-08  DATE OF PROCEDURE:  07/13/2011  PREOPERATIVE DIAGNOSIS: Internal/external hemorrhoids.   POSTOPERATIVE DIAGNOSIS: Anorectal polyp.   PROCEDURE: Exam under anesthesia, excision of anorectal polyp.   SURGEON: Hervey Ard, MD   ANESTHESIA: Attended local, 20 mL Exparel.   ESTIMATED BLOOD LOSS: Less than 5 mL.   CLINICAL NOTE: This 81 year old male has developed recurrent prolapse of what appeared to be hemorrhoidal tissue. He underwent a colonoscopy earlier today with several polyps identified in the rectum at 15 and 30 cm as well as the proximal transverse colon. No other sources of pathology were identified, except on retroflexed view of the scope the lesion which appeared on clinical exam to be a hemorrhoid appeared to be more likely an anorectal polyp. With the patient prone on the operating table and appropriately padded, the area was prepped with Betadine solution and draped. Exparel was infiltrated for postoperative analgesia. There was mild stenosis of the anus, and this was gently dilated to allow the large dilator to be placed. The pathologic process appeared to be at the 6:00 position; in the office, this had appeared to be the 8:00 position. The area appeared to be a large fleshy polyp. This was grasped just at the dentate line, and cautery was used to dissect it free from the underlying muscle fibers. A single vessel was controlled with a 3-0 Vicryl figure-of-eight suture. The mucosal defect was then closed with a running locking 3-0 Vicryl suture. A Vaseline gauze pack was placed for removal 30 minutes later in the recovery room. The patient tolerated the procedure well and was taken to the recovery room in stable condition.  ____________________________ Robert Bellow, MD jwb:cbb D: 07/13/2011 20:36:56 ET T: 07/14/2011 09:18:15 ET JOB#: [id number] cc: Robert Bellow, MD, <Dictator> JEFFREY Amedeo Kinsman  MD ELECTRONICALLY SIGNED 07/19/2011 12:01

## 2015-08-27 ENCOUNTER — Other Ambulatory Visit: Payer: Self-pay | Admitting: Nurse Practitioner

## 2015-08-27 DIAGNOSIS — R1310 Dysphagia, unspecified: Secondary | ICD-10-CM

## 2015-09-08 ENCOUNTER — Ambulatory Visit
Admission: RE | Admit: 2015-09-08 | Discharge: 2015-09-08 | Disposition: A | Discharge status: Home / Self Care | Payer: Medicare Other | Source: Ambulatory Visit | Attending: Nurse Practitioner | Admitting: Nurse Practitioner

## 2015-09-08 DIAGNOSIS — R1310 Dysphagia, unspecified: Secondary | ICD-10-CM | POA: Diagnosis not present

## 2015-09-08 DIAGNOSIS — R1312 Dysphagia, oropharyngeal phase: Secondary | ICD-10-CM

## 2015-09-08 NOTE — Therapy (Signed)
[place], [place], [address] Phone: [phone]   Fax:     Modified Barium Swallow  Patient Details  Name: Kelly Lloyd MRN: [id number] Date of Birth: [birth year] No Data Recorded  Encounter Date: 09/08/2015      End of Session - 09/08/15 1321    Visit Number 1   Number of Visits 1   Date for SLP Re-Evaluation 09/08/15   SLP Start Time 71   SLP Stop Time  1321   SLP Time Calculation (min) 51 min   Activity Tolerance Patient tolerated treatment well      Past Medical History  Diagnosis Date  . Diverticulosis of colon   . Benign prostatic hypertrophy   . Atrial flutter     aflutter s/p RFCA  . Dementia   . BPH (benign prostatic hyperplasia)     Past Surgical History  Procedure Laterality Date  . Anal fissure repair  1956  . Kidney stone surgery  1978  . Mri int  2/204    auit canals wnl except Mastoiditis  . Audiol eval  06/07/02  . Colonoscopy  03/08/06    2 polyps, divertics, int ext hemorrhoids  . Hemorrhoid surgery  03/08/06    [facility]  . Back surgery  2008  . Hosp [facility]  10/2--22/10    aflutter ablation. TEE EF 55% 02/06/09  . Hemm excision  04/07/10    Dr. Bary Castilla  . Transurethral resection of prostate  2011    There were no vitals filed for this visit.   Subjective: Patient behavior: (alertness, ability to follow instructions, etc.): The patient is able to follow simple directions with cues, he has difficulty expressing himself.  Chief complaint: per wife, patient "looks like he has to throw up" while eating   Objective:  Radiological Procedure: A videoflouroscopic evaluation of oral-preparatory, reflex initiation, and pharyngeal phases of the swallow was performed; as well as a screening of the upper esophageal phase.  I. POSTURE: Upright in MBS chair  II. VIEW: Lateral  III. COMPENSATORY STRATEGIES: N/A  IV. BOLUSES ADMINISTERED:   Thin Liquid: 2 small, 5  rapid consecutive swallows   Nectar-thick Liquid: 1 moderate-large    Puree: 2 teaspoon presentations   Mechanical Soft: 1/4 graham cracker in apple sauce  V. RESULTS OF EVALUATION: A. ORAL PREPARATORY PHASE: (The lips, tongue, and velum are observed for strength and coordination)       **Overall Severity Rating: Within normal limits  B. SWALLOW INITIATION/REFLEX: (The reflex is normal if "triggered" by the time the bolus reached the base of the tongue)  **Overall Severity Rating: Mild; triggers at the valleculae  C. PHARYNGEAL PHASE: (Pharyngeal function is normal if the bolus shows rapid, smooth, and continuous transit through the pharynx and there is no pharyngeal residue after the swallow)  **Overall Severity Rating: Minimal; decreased tongue base retraction with min vallecular residue  D. LARYNGEAL PENETRATION: (Material entering into the laryngeal inlet/vestibule but not aspirated) None  E. ASPIRATION  None F. : G. ESOPHAGEAL PHASE: (Screening of the upper esophagus)  ASSESSMENT: 82 year old man, with difficulty swallowing ("he looks like he needs to throw up"), is presenting with minimal oropharyngeal dysphagia.  Oral control of the bolus including oral hold, rotary mastication, and anterior to posterior transfer are within normal limits. Timing of the pharyngeal swallow is delayed, triggering at the valleculae.  With the exception of tongue base retraction, aspects of the pharyngeal stage of swallowing including  hyolaryngeal excursion, epiglottic inversion, duration/amplitude of UES opening, and laryngeal vestibule closure at the height of the swallow are within normal limits.  There is mild vallecular residue.  There was no observed laryngeal penetration or aspiration.  The patient is at not at significant risk for prandial aspiration.  Screening scan of the esophagus shows esophageal retention/stasis to the level of the clavicle.  The patient and his wife were given written  suggestions for managing esophageal dysmotility, but would benefit from GI consult.  PLAN/RECOMMENDATIONS:  A. Diet: Regular as tolerated   B. Swallowing Precautions:  The patient and his wife were given written suggestions for managing esophageal dysmotility   C. Recommended consultation to: GI   D. Therapy recommendations: N/A   E. Results and recommendations were discussed with the patient and his wife immediately following the study and the final report routed to the referring MD.      Dysphagia, oropharyngeal phase  Dysphagia - Plan: DG OP Swallowing Func-Medicare/Speech Path, DG OP Swallowing Func-Medicare/Speech Path      G-Codes - 19-Sep-2015 1321    Functional Assessment Tool Used MBS, clinical judgment   Functional Limitations Swallowing   Swallow Current Status [id number]) At least 1 percent but less than 20 percent impaired, limited or restricted   Swallow Goal Status [id number]) At least 1 percent but less than 20 percent impaired, limited or restricted   Swallow Discharge Status [phone]) At least 1 percent but less than 20 percent impaired, limited or restricted          Problem List Patient Active Problem List   Diagnosis Date Noted  . Anal stricture 04/17/2014  . Rectal pain 04/17/2014  . Unspecified constipation 08/30/2012  . Medicare annual wellness visit, initial 01/19/2012  . Medically noncompliant 01/19/2012  . Post-nasal drip 01/19/2012  . Hemorrhoid 06/24/2011  . PENILE LESION 02/12/2010  . 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 HYPERCHOLESTEROLEMIA 02/10/2010  . MEMORY LOSS 02/05/2009  . DIVERTICULOSIS, COLON 12/15/2006  . BENIGN PROSTATIC HYPERTROPHY 12/15/2006  . OSTEOARTHRITIS, GENERALIZED, MULTIPLE JOINTS 12/15/2006   Leroy Sea, MS/CCC- SLP  Lou Miner Sep 19, 2015, Edson Snowball PM  [place], [place], [address] Phone: [phone]   Fax:     Name:  Kelly Lloyd MRN: [id number] Date of Birth: [birth year]

## 2016-10-17 ENCOUNTER — Emergency Department: Payer: Medicare Other

## 2016-10-17 ENCOUNTER — Emergency Department
Admission: EM | Admit: 2016-10-17 | Discharge: 2016-10-17 | Disposition: A | Discharge status: Home / Self Care | Payer: Medicare Other | Attending: Emergency Medicine | Admitting: Emergency Medicine

## 2016-10-17 ENCOUNTER — Other Ambulatory Visit (HOSPITAL_COMMUNITY): Payer: Medicare Other

## 2016-10-17 ENCOUNTER — Inpatient Hospital Stay (HOSPITAL_COMMUNITY)
Admission: AD | Admit: 2016-10-17 | Discharge: 2016-10-21 | DRG: 064 | Disposition: A | Discharge status: Rehabilitation Facility | Payer: Medicare Other | Source: Other Acute Inpatient Hospital | Attending: Neurology | Admitting: Neurology

## 2016-10-17 DIAGNOSIS — F0281 Dementia in other diseases classified elsewhere with behavioral disturbance: Secondary | ICD-10-CM | POA: Diagnosis not present

## 2016-10-17 DIAGNOSIS — I10 Essential (primary) hypertension: Secondary | ICD-10-CM | POA: Diagnosis not present

## 2016-10-17 DIAGNOSIS — I69354 Hemiplegia and hemiparesis following cerebral infarction affecting left non-dominant side: Secondary | ICD-10-CM

## 2016-10-17 DIAGNOSIS — I4892 Unspecified atrial flutter: Secondary | ICD-10-CM | POA: Diagnosis present

## 2016-10-17 DIAGNOSIS — F05 Delirium due to known physiological condition: Secondary | ICD-10-CM | POA: Diagnosis present

## 2016-10-17 DIAGNOSIS — I63411 Cerebral infarction due to embolism of right middle cerebral artery: Secondary | ICD-10-CM | POA: Diagnosis not present

## 2016-10-17 DIAGNOSIS — W19XXXA Unspecified fall, initial encounter: Secondary | ICD-10-CM | POA: Diagnosis present

## 2016-10-17 DIAGNOSIS — Z79899 Other long term (current) drug therapy: Secondary | ICD-10-CM

## 2016-10-17 DIAGNOSIS — Z87891 Personal history of nicotine dependence: Secondary | ICD-10-CM

## 2016-10-17 DIAGNOSIS — N400 Enlarged prostate without lower urinary tract symptoms: Secondary | ICD-10-CM | POA: Diagnosis present

## 2016-10-17 DIAGNOSIS — Z7401 Bed confinement status: Secondary | ICD-10-CM | POA: Diagnosis not present

## 2016-10-17 DIAGNOSIS — Z9282 Status post administration of tPA (rtPA) in a different facility within the last 24 hours prior to admission to current facility: Secondary | ICD-10-CM | POA: Diagnosis not present

## 2016-10-17 DIAGNOSIS — G8324 Monoplegia of upper limb affecting left nondominant side: Secondary | ICD-10-CM | POA: Diagnosis not present

## 2016-10-17 DIAGNOSIS — I63511 Cerebral infarction due to unspecified occlusion or stenosis of right middle cerebral artery: Secondary | ICD-10-CM | POA: Diagnosis present

## 2016-10-17 DIAGNOSIS — G8194 Hemiplegia, unspecified affecting left nondominant side: Secondary | ICD-10-CM | POA: Diagnosis not present

## 2016-10-17 DIAGNOSIS — F0280 Dementia in other diseases classified elsewhere without behavioral disturbance: Secondary | ICD-10-CM | POA: Diagnosis present

## 2016-10-17 DIAGNOSIS — I6521 Occlusion and stenosis of right carotid artery: Secondary | ICD-10-CM | POA: Diagnosis not present

## 2016-10-17 DIAGNOSIS — E43 Unspecified severe protein-calorie malnutrition: Secondary | ICD-10-CM | POA: Diagnosis present

## 2016-10-17 DIAGNOSIS — Y92009 Unspecified place in unspecified non-institutional (private) residence as the place of occurrence of the external cause: Secondary | ICD-10-CM

## 2016-10-17 DIAGNOSIS — I480 Paroxysmal atrial fibrillation: Secondary | ICD-10-CM | POA: Diagnosis not present

## 2016-10-17 DIAGNOSIS — R531 Weakness: Secondary | ICD-10-CM | POA: Diagnosis present

## 2016-10-17 DIAGNOSIS — G301 Alzheimer's disease with late onset: Secondary | ICD-10-CM | POA: Diagnosis not present

## 2016-10-17 DIAGNOSIS — I639 Cerebral infarction, unspecified: Secondary | ICD-10-CM | POA: Diagnosis present

## 2016-10-17 DIAGNOSIS — R634 Abnormal weight loss: Secondary | ICD-10-CM

## 2016-10-17 DIAGNOSIS — Z9889 Other specified postprocedural states: Secondary | ICD-10-CM | POA: Diagnosis not present

## 2016-10-17 DIAGNOSIS — R29712 NIHSS score 12: Secondary | ICD-10-CM | POA: Diagnosis present

## 2016-10-17 DIAGNOSIS — F0151 Vascular dementia with behavioral disturbance: Secondary | ICD-10-CM | POA: Diagnosis not present

## 2016-10-17 DIAGNOSIS — G309 Alzheimer's disease, unspecified: Secondary | ICD-10-CM | POA: Diagnosis present

## 2016-10-17 DIAGNOSIS — Z87442 Personal history of urinary calculi: Secondary | ICD-10-CM

## 2016-10-17 DIAGNOSIS — G308 Other Alzheimer's disease: Secondary | ICD-10-CM | POA: Diagnosis not present

## 2016-10-17 DIAGNOSIS — Z6824 Body mass index (BMI) 24.0-24.9, adult: Secondary | ICD-10-CM

## 2016-10-17 HISTORY — DX: Cerebral infarction, unspecified: I63.9

## 2016-10-17 HISTORY — DX: Dementia in other diseases classified elsewhere, unspecified severity, without behavioral disturbance, psychotic disturbance, mood disturbance, and anxiety: F02.80

## 2016-10-17 HISTORY — DX: Alzheimer's disease, unspecified: G30.9

## 2016-10-17 LAB — TROPONIN I: Troponin I: <0.03 ng/mL (ref <0.03)

## 2016-10-17 LAB — COMPREHENSIVE METABOLIC PANEL
ALBUMIN: 3.9 g/dL (ref 3.5–5.0)
ALT: 17 U/L (ref 17–63)
AST: 30 U/L (ref 15–41)
Alkaline Phosphatase: 105 U/L (ref 38–126)
Anion gap: 6 (ref 5–15)
BUN: 17 mg/dL (ref 6–20)
CHLORIDE: 104 mmol/L (ref 101–111)
CO2: 30 mmol/L (ref 22–32)
Calcium: 9.6 mg/dL (ref 8.9–10.3)
Creatinine, Ser: 1.27 mg/dL — ABNORMAL HIGH (ref 0.61–1.24)
GFR calc Af Amer: 57 mL/min — ABNORMAL LOW (ref >60)
GFR calc non Af Amer: 49 mL/min — ABNORMAL LOW (ref >60)
GLUCOSE: 95 mg/dL (ref 65–99)
POTASSIUM: 4.3 mmol/L (ref 3.5–5.1)
SODIUM: 140 mmol/L (ref 135–145)
Total Bilirubin: 0.6 mg/dL (ref 0.3–1.2)
Total Protein: 7 g/dL (ref 6.5–8.1)

## 2016-10-17 LAB — APTT: APTT: 29 s (ref 24–36)

## 2016-10-17 LAB — DIFFERENTIAL
BASOS ABS: 0.1 10*3/uL (ref 0–0.1)
BASOS PCT: 1 %
EOS ABS: 0.4 10*3/uL (ref 0–0.7)
Eosinophils Relative: 4 %
LYMPHS ABS: 1.4 10*3/uL (ref 1.0–3.6)
Lymphocytes Relative: 17 %
Monocytes Absolute: 0.7 10*3/uL (ref 0.2–1.0)
Monocytes Relative: 8 %
NEUTROS PCT: 70 %
Neutro Abs: 5.7 10*3/uL (ref 1.4–6.5)

## 2016-10-17 LAB — PROTIME-INR
INR: 1.03
Prothrombin Time: 13.5 seconds (ref 11.4–15.2)

## 2016-10-17 LAB — CBC
HCT: 40.4 % (ref 40.0–52.0)
HEMOGLOBIN: 13.9 g/dL (ref 13.0–18.0)
MCH: 32.3 pg (ref 26.0–34.0)
MCHC: 34.3 g/dL (ref 32.0–36.0)
MCV: 94.3 fL (ref 80.0–100.0)
PLATELETS: 237 10*3/uL (ref 150–440)
RBC: 4.29 MIL/uL — ABNORMAL LOW (ref 4.40–5.90)
RDW: 13.2 % (ref 11.5–14.5)
WBC: 8.2 10*3/uL (ref 3.8–10.6)

## 2016-10-17 LAB — MRSA PCR SCREENING: MRSA BY PCR: NEGATIVE

## 2016-10-17 LAB — GLUCOSE, CAPILLARY: Glucose-Capillary: 89 mg/dL (ref 65–99)

## 2016-10-17 MED ORDER — SODIUM CHLORIDE 0.9 % IV SOLN
50.0000 mL | Freq: Once | INTRAVENOUS | Status: AC
  Administered 2016-10-17: 50 mL via INTRAVENOUS

## 2016-10-17 MED ORDER — PANTOPRAZOLE SODIUM 40 MG IV SOLR
40.0000 mg | Freq: Every day | INTRAVENOUS | Status: DC
  Administered 2016-10-17 – 2016-10-18 (×2): 40 mg via INTRAVENOUS
  Filled 2016-10-17 (×2): qty 40

## 2016-10-17 MED ORDER — SODIUM CHLORIDE 0.9 % IV SOLN
INTRAVENOUS | Status: DC
  Administered 2016-10-17 – 2016-10-21 (×5): via INTRAVENOUS

## 2016-10-17 MED ORDER — DONEPEZIL HCL 10 MG PO TABS
10.0000 mg | ORAL_TABLET | Freq: Every day | ORAL | Status: DC
Start: 2016-10-18 — End: 2016-10-21
  Administered 2016-10-18 – 2016-10-21 (×4): 10 mg via ORAL
  Filled 2016-10-17 (×4): qty 1

## 2016-10-17 MED ORDER — ACETAMINOPHEN 650 MG RE SUPP: 650.0000 mg | RECTAL | Status: DC | PRN

## 2016-10-17 MED ORDER — NICARDIPINE HCL IN NACL 20-0.86 MG/200ML-% IV SOLN: 0.0000 mg/h | INTRAVENOUS | Status: DC

## 2016-10-17 MED ORDER — LORAZEPAM 2 MG/ML IJ SOLN
INTRAMUSCULAR | Status: AC
  Filled 2016-10-17: qty 1

## 2016-10-17 MED ORDER — SENNOSIDES-DOCUSATE SODIUM 8.6-50 MG PO TABS: 1.0000 | ORAL_TABLET | Freq: Every evening | ORAL | Status: DC | PRN

## 2016-10-17 MED ORDER — ACETAMINOPHEN 325 MG PO TABS
650.0000 mg | ORAL_TABLET | ORAL | Status: DC | PRN
  Administered 2016-10-19 – 2016-10-21 (×2): 650 mg via ORAL
  Filled 2016-10-17 (×2): qty 2

## 2016-10-17 MED ORDER — ALTEPLASE (STROKE) FULL DOSE INFUSION
0.9000 mg/kg | Freq: Once | INTRAVENOUS | Status: AC
  Administered 2016-10-17: 57 mg via INTRAVENOUS

## 2016-10-17 MED ORDER — MEMANTINE HCL ER 28 MG PO CP24
28.0000 mg | ORAL_CAPSULE | Freq: Every day | ORAL | Status: DC
  Filled 2016-10-17: qty 1

## 2016-10-17 MED ORDER — IOPAMIDOL (ISOVUE-370) INJECTION 76%
75.0000 mL | Freq: Once | INTRAVENOUS | Status: AC | PRN
  Administered 2016-10-17: 75 mL via INTRAVENOUS

## 2016-10-17 MED ORDER — MEMANTINE HCL 10 MG PO TABS
10.0000 mg | ORAL_TABLET | Freq: Two times a day (BID) | ORAL | Status: DC
  Administered 2016-10-18 – 2016-10-21 (×7): 10 mg via ORAL
  Filled 2016-10-17 (×10): qty 1

## 2016-10-17 MED ORDER — ALTEPLASE 100 MG IV SOLR
INTRAVENOUS | Status: AC
  Administered 2016-10-17: 57 mg via INTRAVENOUS
  Filled 2016-10-17: qty 100

## 2016-10-17 MED ORDER — DUTASTERIDE 0.5 MG PO CAPS
0.5000 mg | ORAL_CAPSULE | Freq: Every day | ORAL | Status: DC
  Administered 2016-10-18 – 2016-10-20 (×3): 0.5 mg via ORAL
  Filled 2016-10-17 (×5): qty 1

## 2016-10-17 MED ORDER — LORAZEPAM 2 MG/ML IJ SOLN
0.5000 mg | Freq: Once | INTRAMUSCULAR | Status: AC
  Administered 2016-10-17: 0.5 mg via INTRAVENOUS

## 2016-10-17 MED ORDER — ACETAMINOPHEN 160 MG/5ML PO SOLN: 650.0000 mg | ORAL | Status: DC | PRN

## 2016-10-17 MED ORDER — STROKE: EARLY STAGES OF RECOVERY BOOK
Freq: Once | Status: AC
  Administered 2016-10-17: 14:00:00
  Filled 2016-10-17: qty 1

## 2016-10-17 NOTE — ED Notes (Signed)
ED staff present during code stroke  Primary RN [place] RNs [place] and Manuela Schwartz

## 2016-10-17 NOTE — ED Notes (Signed)
Pt wife reports pt last known well was around 830am when the pt was up getting dressed for church this morning.

## 2016-10-17 NOTE — ED Notes (Signed)
[place] set up in pt room.

## 2016-10-17 NOTE — ED Notes (Signed)
CH met with family at bedside; [facility] offered support, but none needed at present; [facility] available as needed.

## 2016-10-17 NOTE — H&P (Signed)
Admission H&P  Kelly Lloyd is an 83 y.o. male.   Chief Complaint: Acute stroke HPI: Patient is an 83 yo male with history of Alzheimer's dementia who was found down at home at 8:30 am.  He was taken to [facility] where he was found to have isolated left arm weakness.   Consultation with tele-neurology resulted in IV tPA administration at 11:30 am or 3 hours after onset.  CT Brain showed no hemorrhage, but an old right parietal infarct.  CTA Brain was essentially negative except for 50% stenosis of distal basilar artery.    Past Medical History:  Diagnosis Date  . Atrial flutter ([place])    aflutter s/p RFCA  . Benign prostatic hypertrophy   . BPH (benign prostatic hyperplasia)   . Dementia   . Diverticulosis of colon     Past Surgical History:  Procedure Laterality Date  . [place]  . audiol eval  06/07/02  . BACK SURGERY  2008  . COLONOSCOPY  03/08/06   2 polyps, divertics, int ext hemorrhoids  . Hemm excision  04/07/10   Dr. Bary Castilla  . HEMORRHOID SURGERY  03/08/06   [facility]  . HOSP [facility]  10/2--22/10   aflutter ablation. TEE EF 55% 02/06/09  . [employer]  . MRI INT  2/204   auit canals wnl except Mastoiditis  . TRANSURETHRAL RESECTION OF PROSTATE  2011    Family History  Problem Relation Age of Onset  . Thyroid disease Unknown        family Hx  . Stroke Neg Hx        neg hx of CV  . Diabetes Neg Hx        DM; also neg hx of HBP  . Cancer Neg Hx        breast, ovarian, uterine  . Depression Neg Hx   . Alcohol abuse Neg Hx        no drug abuse    Social History:  reports that he quit smoking about 42 years ago. He has a 24.00 pack-year smoking history. He has never used smokeless tobacco. He reports that he does not drink alcohol or use drugs.  Allergies: No Known Allergies  Medications Prior to Admission  Medication Sig Dispense Refill  . donepezil (ARICEPT) 10 MG tablet Take 10 mg by mouth daily.      Marland Kitchen dutasteride (AVODART) 0.5 MG  capsule Take 0.5 mg by mouth daily.    . Memantine HCl ER (NAMENDA XR) 28 MG CP24 Take 1 capsule by mouth daily.      Results for orders placed or performed during the hospital encounter of 10/17/16 (from the past 48 hour(s))  Protime-INR     Status: None   Collection Time: 10/17/16 10:11 AM  Result Value Ref Range   Prothrombin Time 13.5 11.4 - 15.2 seconds   INR 1.03   APTT     Status: None   Collection Time: 10/17/16 10:11 AM  Result Value Ref Range   aPTT 29 24 - 36 seconds  CBC     Status: Abnormal   Collection Time: 10/17/16 10:11 AM  Result Value Ref Range   WBC 8.2 3.8 - 10.6 K/uL   RBC 4.29 (L) 4.40 - 5.90 MIL/uL   Hemoglobin 13.9 13.0 - 18.0 g/dL   HCT 40.4 40.0 - 52.0 %   MCV 94.3 80.0 - 100.0 fL   MCH 32.3 26.0 - 34.0 pg   MCHC 34.3  32.0 - 36.0 g/dL   RDW 13.2 11.5 - 14.5 %   Platelets 237 150 - 440 K/uL  Differential     Status: None   Collection Time: 10/17/16 10:11 AM  Result Value Ref Range   Neutrophils Relative % 70 %   Neutro Abs 5.7 1.4 - 6.5 K/uL   Lymphocytes Relative 17 %   Lymphs Abs 1.4 1.0 - 3.6 K/uL   Monocytes Relative 8 %   Monocytes Absolute 0.7 0.2 - 1.0 K/uL   Eosinophils Relative 4 %   Eosinophils Absolute 0.4 0 - 0.7 K/uL   Basophils Relative 1 %   Basophils Absolute 0.1 0 - 0.1 K/uL  Comprehensive metabolic panel     Status: Abnormal   Collection Time: 10/17/16 10:11 AM  Result Value Ref Range   Sodium 140 135 - 145 mmol/L   Potassium 4.3 3.5 - 5.1 mmol/L   Chloride 104 101 - 111 mmol/L   CO2 30 22 - 32 mmol/L   Glucose, Bld 95 65 - 99 mg/dL   BUN 17 6 - 20 mg/dL   Creatinine, Ser 1.27 (H) 0.61 - 1.24 mg/dL   Calcium 9.6 8.9 - 10.3 mg/dL   Total Protein 7.0 6.5 - 8.1 g/dL   Albumin 3.9 3.5 - 5.0 g/dL   AST 30 15 - 41 U/L   ALT 17 17 - 63 U/L   Alkaline Phosphatase 105 38 - 126 U/L   Total Bilirubin 0.6 0.3 - 1.2 mg/dL   GFR calc non Af Amer 49 (L) >60 mL/min   GFR calc Af Amer 57 (L) >60 mL/min    Comment: (NOTE) The eGFR  has been calculated using the CKD EPI equation. This calculation has not been validated in all clinical situations. eGFR's persistently <60 mL/min signify possible Chronic Kidney Disease.    Anion gap 6 5 - 15  Troponin I     Status: None   Collection Time: 10/17/16 10:11 AM  Result Value Ref Range   Troponin I <0.03 <0.03 ng/mL  Glucose, capillary     Status: None   Collection Time: 10/17/16 10:44 AM  Result Value Ref Range   Glucose-Capillary 89 65 - 99 mg/dL   Ct Angio Head W Or Wo Contrast  Result Date: 10/17/2016 CLINICAL DATA:  Acute presentation with left arm weakness. Negative acute CT earlier today. EXAM: CT ANGIOGRAPHY HEAD TECHNIQUE: Multidetector CT imaging of the head was performed using the standard protocol during bolus administration of intravenous contrast. Multiplanar CT image reconstructions and MIPs were obtained to evaluate the vascular anatomy. CONTRAST:  75 cc Isovue 370 COMPARISON:  Head CT earlier same day FINDINGS: CTA HEAD Anterior circulation: Both internal carotid arteries are patent through the siphon regions. There is peripheral siphon calcification with stenosis estimated at 30-50% bilaterally. Anterior and middle cerebral vessels are patent without proximal stenosis or detectable missing vessel. Posterior circulation: Both vertebral arteries are patent at the foramen magnum. There is extensive calcification of the right vertebral artery at the foramen magnum with stenosis estimated at 50-70% but patency beyond that. Small left vertebral terminates in PICA up. There is 50% stenosis of the distal basilar artery. Superior cerebellar and posterior cerebral vessels do show flow. Venous sinuses: Patent and normal Anatomic variants: None significant Delayed phase: No abnormal enhancement IMPRESSION: No large vessel occlusion. Peripheral calcification throughout both carotid siphons with stenosis estimated at 30-50% bilaterally. Atherosclerotic calcification of the right  vertebral artery at the foramen magnum with stenosis estimated at 50-70%  in that segment. 50% stenosis of the distal basilar artery. No branch vessel occlusion. These results were called by telephone at the time of interpretation on 10/17/2016 at 12:24 pm to Dr. Lavonia Drafts , who verbally acknowledged these results. Electronically Signed   By: Nelson Chimes M.D.   On: 10/17/2016 12:26   Ct Head Code Stroke W/o Cm  Result Date: 10/17/2016 CLINICAL DATA:  Code stroke. Fell last night. Left arm weakness, worsening today. EXAM: CT HEAD WITHOUT CONTRAST TECHNIQUE: Contiguous axial images were obtained from the base of the skull through the vertex without intravenous contrast. COMPARISON:  Brain MRI 05/09/2009. FINDINGS: Brain: Chronic small-vessel ischemic changes affect the pons. No focal cerebellar finding. Cerebral hemispheres show generalized atrophy with chronic small-vessel ischemic changes of the thalami and white matter. Old right parietal cortical and subcortical infarction. No sign of acute infarction, mass lesion, hemorrhage, hydrocephalus or extra-axial collection. Vascular: There is atherosclerotic calcification of the major vessels at the base of the brain. Skull: Negative Sinuses/Orbits: Opacification of the right maxillary, ethmoid and frontal sinuses consistent with chronic sinus inflammation. Mucocele not excluded. Orbits negative. Other: None significant ASPECTS ([place] Stroke Program Early CT Score) - Ganglionic level infarction (caudate, lentiform nuclei, internal capsule, insula, M1-M3 cortex): 7 - Supraganglionic infarction (M4-M6 cortex): 3 Total score (0-10 with 10 being normal): 10 IMPRESSION: 1. No acute finding by CT. Atrophy and chronic small vessel ischemic changes. Old right parietal cortical and subcortical infarction. Extensive sinus inflammatory disease on the right. Possible mucoceles. 2. ASPECTS is 10. These results were called by telephone at the time of interpretation on 10/17/2016  at 10:35 am to Dr. Lavonia Drafts , who verbally acknowledged these results. Electronically Signed   By: Nelson Chimes M.D.   On: 10/17/2016 10:37    ROS  Physical Examination: Blood pressure (!) 153/107, pulse (!) 120, temperature 97.8 F (36.6 C), resp. rate (!) 25, SpO2 97 %.   Neurologic Examination:  Awake, alert, agitated and in restraints.   Not oriented. PERL.  EOMI. Face symmetric. Tongue midline.   Strength RUE and BLE 5/5 and O/5 LUE No babinski. No hoffman's.   A/P:  Possible right subcortical coronal radiata infarct resulting in left arm paralysis s/p IV-tPA at 3 hours after onset of symptoms.  He has been admitted to the neuro ICU.  He is quite agitated, likely due to environmental changes in a dementia patient.  I will give him a very low dose of Ativan 0.5 mg IV to calm him down.   Will get MRI with gradient echo in 24 hours to assess for stroke completion and any hemorrhage.  No antiplatelets in first 24 hours.  SCDs only.  BP is relatively well controlled but will use Cardene gtt if needed.    [place], [place] 10/17/2016 1:43 PM

## 2016-10-17 NOTE — ED Provider Notes (Signed)
[facility] - Inpatient Emergency Department Provider Note   ____________________________________________    I have reviewed the triage vital signs and the nursing notes.   HISTORY  Chief Complaint Fall and Weakness   History limited by Alzheimer's dementia   HPI Kelly Lloyd is a 83 y.o. male who presents after a fall. Patient is unable to provide significant history but does report that there is something wrong with his left arm. Wife describes that the patient appeared normal this morning but at approximately 8:30 she found the patient had fallen in the closet and he had difficulty moving his left arm. She reports his mental status has been slightly worse over the last day.   Past Medical History:  Diagnosis Date  . Atrial flutter ([place])    aflutter s/p RFCA  . Benign prostatic hypertrophy   . BPH (benign prostatic hyperplasia)   . Dementia   . Diverticulosis of colon     Patient Active Problem List   Diagnosis Date Noted  . Anal stricture 04/17/2014  . Rectal pain 04/17/2014  . Unspecified constipation 08/30/2012  . Medicare annual wellness visit, initial 01/19/2012  . Medically noncompliant 01/19/2012  . Post-nasal drip 01/19/2012  . Hemorrhoid 06/24/2011  . PENILE LESION 02/12/2010  . 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 HYPERCHOLESTEROLEMIA 02/10/2010  . MEMORY LOSS 02/05/2009  . DIVERTICULOSIS, COLON 12/15/2006  . BENIGN PROSTATIC HYPERTROPHY 12/15/2006  . OSTEOARTHRITIS, GENERALIZED, MULTIPLE JOINTS 12/15/2006    Past Surgical History:  Procedure Laterality Date  . [place]  . audiol eval  06/07/02  . BACK SURGERY  2008  . COLONOSCOPY  03/08/06   2 polyps, divertics, int ext hemorrhoids  . Hemm excision  04/07/10   Dr. Bary Castilla  . HEMORRHOID SURGERY  03/08/06   [place]  . HOSP [facility]  10/2--22/10   aflutter ablation. TEE EF 55% 02/06/09  . [place]  . MRI INT  2/204   auit canals wnl except  Mastoiditis  . TRANSURETHRAL RESECTION OF PROSTATE  2011    Prior to Admission medications   Medication Sig Start Date End Date Taking? Authorizing Provider  donepezil (ARICEPT) 10 MG tablet Take 10 mg by mouth daily.     Yes [provider]  dutasteride (AVODART) 0.5 MG capsule Take 0.5 mg by mouth daily.   Yes [provider]  Memantine HCl ER (NAMENDA XR) 28 MG CP24 Take 1 capsule by mouth daily.   Yes [provider]     Allergies Patient has no known allergies.  Family History  Problem Relation Age of Onset  . Thyroid disease Unknown        family Hx  . Stroke Neg Hx        neg hx of CV  . Diabetes Neg Hx        DM; also neg hx of HBP  . Cancer Neg Hx        breast, ovarian, uterine  . Depression Neg Hx   . Alcohol abuse Neg Hx        no drug abuse     Social History Social History  Substance Use Topics  . Smoking status: Former Smoker    Packs/day: 1.00    Years: 24.00    Quit date: 01/12/1974  . Smokeless tobacco: Never Used     Comment: quit in 1975  . Alcohol use No     Comment: Has a rare beer once a month  at most.    Review of SystemsPer family and patient  Constitutional: No fevers Eyes: Denies visual changes.  ENT: No neck pain Cardiovascular: Denies chest pain. Respiratory: Denies shortness of breath. Gastrointestinal: No abdominal pain.  No nausea, no vomiting.   Genitourinary: Negative for dysuria. Musculoskeletal: Negative for back pain. Skin: Negative for rash. Neurological: Negative for headaches   ____________________________________________   PHYSICAL EXAM:  VITAL SIGNS: ED Triage Vitals  Enc Vitals Group     BP 10/17/16 0954 (!) 123/40     Pulse Rate 10/17/16 0954 (!) 54     Resp 10/17/16 0954 16     Temp 10/17/16 0954 98 F (36.7 C)     Temp Source 10/17/16 0954 Oral     SpO2 10/17/16 0954 98 %     Weight 10/17/16 0955 64.7 kg (142 lb 10.2 oz)     Height 10/17/16 0955 1.829 m (6')     Head  Circumference --      Peak Flow --      Pain Score --      Pain Loc --      Pain Edu? --      Excl. in [place]? --     Constitutional: Alert and oriented. No acute distress. Pleasant and interactive Eyes: Conjunctivae are normal.  Head: Atraumatic. Nose: No congestion/rhinnorhea. Mouth/Throat: Mucous membranes are moist.   Neck:  Painless ROM Cardiovascular: Normal rate, regular rhythm. Grossly normal heart sounds.  Good peripheral circulation. Respiratory: Normal respiratory effort.  No retractions. Lungs CTAB. Gastrointestinal: Soft and nontender. No distention.  No CVA tenderness. Genitourinary: deferred Musculoskeletal: No lower extremity tenderness nor edema.  All extremities Warm and well perfused. Patient has essentially paresis of the left arm, unable to move the arm at all. No tenderness to palpation, no swelling no pain at all with passive range of motion. No bruising or injury Neurologic:  Normal speech and language. left arm weakness as above, otherwise unremarkable neuro exam Skin:  Skin is warm, dry and intact. No rash noted. Psychiatric: Mood and affect are normal. Speech and behavior are normal.  ____________________________________________   LABS (all labs ordered are listed, but only abnormal results are displayed)  Labs Reviewed  CBC - Abnormal; Notable for the following:       Result Value   RBC 4.29 (*)    All other components within normal limits  COMPREHENSIVE METABOLIC PANEL - Abnormal; Notable for the following:    Creatinine, Ser 1.27 (*)    GFR calc non Af Amer 49 (*)    GFR calc Af Amer 57 (*)    All other components within normal limits  PROTIME-INR  APTT  DIFFERENTIAL  TROPONIN I  GLUCOSE, CAPILLARY  CBG MONITORING, ED   ____________________________________________  EKG  None ____________________________________________  RADIOLOGY  CT head unremarkable CT angiogram of the head no large vessel  stroke ____________________________________________   PROCEDURES  Procedure(s) performed: No    Critical Care performed: yes  CRITICAL CARE Performed by: Lavonia Drafts   Total critical care time: 40 minutes  Critical care time was exclusive of separately billable procedures and treating other patients.  Critical care was necessary to treat or prevent imminent or life-threatening deterioration.  Critical care was time spent personally by me on the following activities: development of treatment plan with patient and/or surrogate as well as nursing, discussions with consultants, evaluation of patient's response to treatment, examination of patient, obtaining history from patient or surrogate, ordering and performing treatments and interventions,  ordering and review of laboratory studies, ordering and review of radiographic studies, pulse oximetry and re-evaluation of patient's condition.  ____________________________________________   INITIAL IMPRESSION / ASSESSMENT AND PLAN / ED COURSE  Pertinent labs & imaging results that were available during my care of the patient were reviewed by me and considered in my medical decision making (see chart for details).  Patient presents with onset of left arm weakness. Apparently this occurred at approximately 8:30 AM putting him within the window for code stroke. Code stroke called immediately    ----------------------------------------- 11:20 AM on 10/17/2016 -----------------------------------------  Dr. Alfonse Spruce, Neurologist recommends tPA. Discussed with family and they agree with giving TPA and understand the need for transfer. TPA ordered. Neurology also requested CTA to look for large vessel stroke. This was performed and no evidence of large vessel stroke  Transfer arranged to [facility]. Accepted by Dr. Patrcia Dolly.     ____________________________________________   FINAL CLINICAL IMPRESSION(S) / ED DIAGNOSES  Final diagnoses:   Cerebrovascular accident (CVA), unspecified mechanism ( [place])      NEW MEDICATIONS STARTED DURING THIS VISIT:  New Prescriptions   No medications on file     Note:  This document was prepared using Dragon voice recognition software and may include unintentional dictation errors.    Lavonia Drafts, MD 10/17/16 [phone]

## 2016-10-17 NOTE — ED Triage Notes (Signed)
Pt comes into the ED via EMS from home , lives with wife. Who reports found pt on the floor in the bedroom this morning and unsure of how he fell. Pt has dementia and is unable to give hx. Wife reports pt having increased confusion over the past 24hrs , pt presents with paralysis of the left arm, all other neuro intact with no other deficit noted at present.the patient denies any pain on arrival..

## 2016-10-17 NOTE — Progress Notes (Signed)
Patient very agitated and restless upon arrival. No family at bedside at the time. MD paged. Garo Heidelberg, Rande Brunt, RN

## 2016-10-17 NOTE — ED Notes (Signed)
TPA started with 5.7 bolus and 51.3 over 1 hour. TPA verified with Yong Channel, RN.

## 2016-10-18 ENCOUNTER — Inpatient Hospital Stay (HOSPITAL_COMMUNITY): Payer: Medicare Other

## 2016-10-18 ENCOUNTER — Encounter (HOSPITAL_COMMUNITY): Payer: Self-pay | Admitting: *Deleted

## 2016-10-18 DIAGNOSIS — I480 Paroxysmal atrial fibrillation: Secondary | ICD-10-CM

## 2016-10-18 DIAGNOSIS — I639 Cerebral infarction, unspecified: Secondary | ICD-10-CM

## 2016-10-18 DIAGNOSIS — I63411 Cerebral infarction due to embolism of right middle cerebral artery: Secondary | ICD-10-CM

## 2016-10-18 DIAGNOSIS — F0280 Dementia in other diseases classified elsewhere without behavioral disturbance: Secondary | ICD-10-CM

## 2016-10-18 DIAGNOSIS — Z9889 Other specified postprocedural states: Secondary | ICD-10-CM

## 2016-10-18 DIAGNOSIS — G309 Alzheimer's disease, unspecified: Secondary | ICD-10-CM

## 2016-10-18 DIAGNOSIS — R634 Abnormal weight loss: Secondary | ICD-10-CM

## 2016-10-18 LAB — ECHOCARDIOGRAM COMPLETE
Height: 72 in
WEIGHTICAEL: 2846.58 [oz_av]

## 2016-10-18 LAB — LIPID PANEL
CHOL/HDL RATIO: 2.4 ratio
CHOLESTEROL: 136 mg/dL (ref 0–200)
HDL: 57 mg/dL (ref >40)
LDL Cholesterol: 66 mg/dL (ref 0–99)
Triglycerides: 63 mg/dL (ref <150)
VLDL: 13 mg/dL (ref 0–40)

## 2016-10-18 MED ORDER — ASPIRIN EC 325 MG PO TBEC
325.0000 mg | DELAYED_RELEASE_TABLET | Freq: Every day | ORAL | Status: DC
  Filled 2016-10-18: qty 1

## 2016-10-18 MED ORDER — LORAZEPAM 2 MG/ML IJ SOLN
INTRAMUSCULAR | Status: AC
  Filled 2016-10-18: qty 1

## 2016-10-18 MED ORDER — ASPIRIN 300 MG RE SUPP: 300.0000 mg | Freq: Every day | RECTAL | Status: DC

## 2016-10-18 MED ORDER — ENSURE ENLIVE PO LIQD
237.0000 mL | Freq: Three times a day (TID) | ORAL | Status: DC
  Administered 2016-10-18 – 2016-10-21 (×8): 237 mL via ORAL

## 2016-10-18 MED ORDER — ENOXAPARIN SODIUM 40 MG/0.4ML ~~LOC~~ SOLN
40.0000 mg | SUBCUTANEOUS | Status: DC
  Administered 2016-10-18 – 2016-10-20 (×3): 40 mg via SUBCUTANEOUS
  Filled 2016-10-18 (×3): qty 0.4

## 2016-10-18 MED ORDER — ASPIRIN 325 MG PO TABS
325.0000 mg | ORAL_TABLET | Freq: Every day | ORAL | Status: DC
  Administered 2016-10-18 – 2016-10-21 (×4): 325 mg via ORAL
  Filled 2016-10-18 (×5): qty 1

## 2016-10-18 MED ORDER — LORAZEPAM 2 MG/ML IJ SOLN
0.5000 mg | Freq: Once | INTRAMUSCULAR | Status: AC
  Administered 2016-10-18: 0.5 mg via INTRAVENOUS

## 2016-10-18 NOTE — Evaluation (Signed)
Occupational Therapy Evaluation Patient Details Name: Kelly Lloyd MRN: [id number] DOB: [birth year] Today's Date: 10/18/2016    History of Present Illness Pt is an 83 y.o. male who was found down at home with L sided weakness and was taken to [facility] and was subsequently transferred to [facility]. He was administered tPA at 11:30 on 10/17/16. He has a PMH significant for atrial flutter (s/p ablation in 2013) and Alzheimer's dementia. MRI revealed R MCA territory scattered infarcts.    Clinical Impression   PTA, pt was independent with basic ADL and functional mobility and was very active (regulaly going to the gym with his wife). Pt currently requires max-total assist for dressing, bathing, and toileting tasks. He is able to complete grooming tasks with mod assist and multimodal cues. Pt demonstrates the ability to follow one-step commands with increased time and verbal cues. He presents with decreased L UE strength and AROM, poor attention to L side of his body and visual field, and decreased attention impacting his ability to participate in ADL at [facility]. Pt would benefit from continued OT services while admitted to maximize independence with ADL and functional mobility. Feel pt is an excellent candidate for CIR level therapies post-acute D/C in order to maximize independence with ADL and return to PLOF. OT will continue to follow while admitted.    Follow Up Recommendations  CIR;Supervision/Assistance - 24 hour    Equipment Recommendations  Other (comment) (TBD at next venue of care)    Recommendations for Other Services       Precautions / Restrictions Precautions Precautions: Fall Restrictions Weight Bearing Restrictions: No      Mobility Bed Mobility Overal bed mobility: Needs Assistance Bed Mobility: Supine to Sit;Sit to Supine     Supine to sit: Max assist Sit to supine: Max assist;+2 for physical assistance   General bed mobility comments: Patient required max assist to elevate  trunk to upright and rotate to EOB. ABle to show some initiation of LE movement (right) to EOB but requires multimodal cues and increased time, increased physical assist to bring LLE to EOB and elevate/rotate.  Upon return to bed patient showing initiation of bilateral LEs but unable to clear LLE up into bed, required assist to return to supine and reposition.  Transfers Overall transfer level: Needs assistance Equipment used:  (posey belt and wrap around support) Transfers: Sit to/from Stand Sit to Stand: +2 physical assistance;Max assist         General transfer comment: +2 max assist to power up to standing, patient able to take on weight through bilateral LEs and push to power up. Increased effort to perform. Performed x3 during session. Patient able to maintain upright with one person physical assist and LE blocking.    Balance Overall balance assessment: Needs assistance Sitting-balance support: No upper extremity supported;Feet supported Sitting balance-Leahy Scale: Poor     Standing balance support: Bilateral upper extremity supported;During functional activity Standing balance-Leahy Scale: Poor                             ADL either performed or assessed with clinical judgement   ADL Overall ADL's : Needs assistance/impaired Eating/Feeding: Moderate assistance;Bed level Eating/Feeding Details (indicate cue type and reason): Family reports decreased accuracy to target when feeding himself.  Grooming: Maximal assistance;Sitting   Upper Body Bathing: Maximal assistance;Sitting   Lower Body Bathing: Total assistance;Sit to/from stand   Upper Body Dressing : Maximal assistance;Sitting  Lower Body Dressing: Total assistance;Sit to/from Health and safety inspector Details (indicate cue type and reason): Able to complete sit<>stand in preparation for toilet transfers with max assist +2. Toileting- Clothing Manipulation and Hygiene: Total assistance;Sit to/from  stand       Functional mobility during ADLs: Moderate assistance;+2 for safety/equipment General ADL Comments: Pt able to follow commands during ADL participation with increased time.      Vision   Vision Assessment?: Vision impaired- to be further tested in functional context Additional Comments: Pt with decreased attention to L visual field noted. Able to maintain midline gaze and requires max cues to cross midline in supine. In standing, noted increased difficulty due to increased task demands.      Perception     Praxis Praxis Praxis-Other Comments: Need to further assess    Pertinent Vitals/Pain Pain Assessment: Faces Faces Pain Scale: No hurt     Hand Dominance Right   Extremity/Trunk Assessment Upper Extremity Assessment Upper Extremity Assessment: LUE deficits/detail;Difficult to assess due to impaired cognition LUE Deficits / Details: Trace muscle activation in L grasp and at elbow. PROM WFL.    Lower Extremity Assessment Lower Extremity Assessment: LLE deficits/detail;Difficult to assess due to impaired cognition LLE Deficits / Details: noted active movement LLE 2+/5 but unable to isolate in testing. Patient is able to take on weight in functional standing with some knee flexion noted LLE Coordination: decreased fine motor;decreased gross motor       Communication Communication Communication: Expressive difficulties   Cognition Arousal/Alertness: Awake/alert (lethargic post-activity) Behavior During Therapy: Restless Overall Cognitive Status: Impaired/Different from baseline Area of Impairment: Orientation;Attention;Memory;Following commands;Safety/judgement;Awareness;Problem solving                 Orientation Level: Disoriented to;Place;Time;Situation Current Attention Level: Focused   Following Commands: Follows one step commands inconsistently;Follows one step commands with increased time Safety/Judgement: Decreased awareness of safety;Decreased  awareness of deficits Awareness: Intellectual Problem Solving: Slow processing;Decreased initiation;Difficulty sequencing;Requires verbal cues;Requires tactile cues General Comments: Inattention to left side noted. Able to respond well to verbal and tactile cues to look and attend left, poor awareness of left extremities.    General Comments       Exercises     Shoulder Instructions      Home Living Family/patient expects to be discharged to:: Private residence Living Arrangements: Spouse/significant other Available Help at Discharge: Family Type of Home: House Home Access: Stairs to enter Technical brewer of Steps: 2   Home Layout: Two level;Bed/bath upstairs Alternate Level Stairs-Number of Steps: 12 Alternate Level Stairs-Rails: Can reach both Bathroom Shower/Tub: Walk-in shower (built-in shower seat)   Biochemist, clinical: Standard     Home Equipment: Shower seat - built in          Prior Functioning/Environment Level of Independence: Independent        Comments: Has dementia, at baseline, but he is able to feed himself, wash himself, can go to gym with wife and walk on treadmill. Per wife has been pretty independent at home despite dementia        OT Problem List: Decreased strength;Decreased range of motion;Decreased activity tolerance;Impaired balance (sitting and/or standing);Decreased safety awareness;Decreased knowledge of use of DME or AE;Decreased knowledge of precautions;Decreased coordination;Impaired vision/perception;Decreased cognition;Impaired UE functional use      OT Treatment/Interventions: Self-care/ADL training;Therapeutic exercise;Energy conservation;DME and/or AE instruction;Neuromuscular education;Therapeutic activities;Cognitive remediation/compensation;Splinting;Visual/perceptual remediation/compensation;Patient/family education;Balance training    OT Goals(Current goals can be found in the care plan section) Acute Rehab OT Goals  Patient  Stated Goal: per family to get better OT Goal Formulation: With patient/family Time For Goal Achievement: 11/01/16 Potential to Achieve Goals: Good ADL Goals Pt Will Perform Eating: with min guard assist;sitting Pt Will Perform Grooming: with min assist;sitting Pt/caregiver will Perform Home Exercise Program: Left upper extremity;With written HEP provided;With Supervision Additional ADL Goal #1: Pt will complete bed mobility in preparation for ADL seated at EOB with overall min assist.  Additional ADL Goal #2: Pt will demonstrate sustained attention to oral care task seated at EOB in a non-distracting environment with no more than 1 VC.  OT Frequency: Min 3X/week   Barriers to D/C:            Co-evaluation   Reason for Co-Treatment: For patient/therapist safety;Complexity of the patient's impairments (multi-system involvement) PT goals addressed during session: Mobility/safety with mobility        AM-PAC PT "6 Clicks" Daily Activity     Outcome Measure Help from another person eating meals?: A Lot Help from another person taking care of personal grooming?: A Lot Help from another person toileting, which includes using toliet, bedpan, or urinal?: Total Help from another person bathing (including washing, rinsing, drying)?: A Lot Help from another person to put on and taking off regular upper body clothing?: A Lot Help from another person to put on and taking off regular lower body clothing?: Total 6 Click Score: 10   End of Session Equipment Utilized During Treatment: Gait belt;Rolling walker Nurse Communication: Mobility status  Activity Tolerance: Patient tolerated treatment well Patient left: in bed;with call bell/phone within reach;with family/visitor present  OT Visit Diagnosis: Unsteadiness on feet (R26.81);Hemiplegia and hemiparesis;Cognitive communication deficit (R41.841) Symptoms and signs involving cognitive functions: Cerebral infarction Hemiplegia - Right/Left:  Left Hemiplegia - caused by: Cerebral infarction                Time: 8101-7510 OT Time Calculation (min): 38 min Charges:  OT General Charges $OT Visit: 1 Procedure OT Evaluation $OT Eval Moderate Complexity: 1 Procedure G-Codes:     Norman Herrlich, MS OTR/L  Pager: [place] [facility] Lior Cartelli 10/18/2016, 6:13 PM

## 2016-10-18 NOTE — Progress Notes (Signed)
  Echocardiogram 2D Echocardiogram has been performed.  Kelly Lloyd 10/18/2016, 2:01 PM

## 2016-10-18 NOTE — Discharge Instructions (Signed)
Nutrition Post Hospital Stay °Proper nutrition can help your body recover from illness and injury.   °Foods and beverages high in protein, vitamins, and minerals help rebuild muscle loss, promote healing, & reduce fall risk.  ° °•In addition to eating healthy foods, a nutrition shake is an easy, delicious way to get the nutrition you need during and after your hospital stay ° °It is recommended that you continue to drink 2 bottles per day of:       Ensure Plus or Ensure Enlive for at least 1 month (30 days) after your hospital stay  ° °Tips for adding a nutrition shake into your routine: °As allowed, drink one with vitamins or medications instead of water or juice °Enjoy one as a tasty mid-morning or afternoon snack °Drink cold or make a milkshake out of it °Drink one instead of milk with cereal or snacks °Use as a coffee creamer °  °Available at the following grocery stores and pharmacies:           °* [employer] * [employer] * [employer]  °* [employer]          * [employer] * [employer]  °* [facility]      * Target  * [employer]   °* [facility]  * [employer]   °* [facility] [phone]  °          °For COUPONS visit: www.ensure.com/join or www.[email or web address]/members/sign-[email or web address]  ° °Suggested Substitutions °Ensure Plus = Boost Plus = Carnation Breakfast Essentials = Boost Compact °Ensure Active Clear = Boost Breeze °Glucerna Shake = Boost Glucose Control = Carnation Breakfast Essentials SUGAR FREE ° °  ° °

## 2016-10-18 NOTE — Progress Notes (Signed)
STROKE TEAM PROGRESS NOTE   HISTORY OF PRESENT ILLNESS (per record) Kelly Lloyd. Face is an 83 year-old male with a history of atrial flutter (s/p ablation in 2013), and dementia presenting to [facility] ED 10/17/2016 with left arm weakness after a fall at home.  The patient lives with his wife, who found him on the floor on the morning of 10/17/2016.  His wife noted worsening mental status in the day preceding the fall.  Per his son, his speech and word-finding are very limited at baseline, and appear unchanged on this admission.  CT head with no acute stroke and re-demonstration of old right parietal cortical and subcortical infarction.  CTA head with no large vessel occlusion, 30-50% lateral carotid stenosis, 50-70% atherosclerosis of the right vertebral artery at the foramen magnum, and 50% stenosis of the distal basilar artery.  Patient was administered tPA at 1130 on 10/17/2016.  He was admitted to the neuro ICU for further evaluation and treatment.   SUBJECTIVE (INTERVAL HISTORY) His son is at the bedside during round, but I also met pt wife in the afternoon. The patient opens eyes to voice, but is drowsy and confused.  MRI showed right MCA scattered infarct, embolic.   As per wife, pt had afib s/o ablation more than 5 years ago, followed with cardiologist in [place] for a couple of times post procedure and then discharged from cardiology. He was on very short period time with coumadin and then discontinued as per Doctor's instructions. Patient also has dementia, at baseline, he is able to feed himself, wash himself, can go to gym with wife and walk on treadmill, however, he is sleeping most time of the day at home, difficulty with words and communications, and with sundowning at night. He has lost about 40 pounds for the last 6 months, followed with PCP, concerning for not eating well. However, wife stated that patient eats well at home.   OBJECTIVE Temp:  [97.7 F (36.5 C)-98.5 F (36.9 C)] 98.5 F  (36.9 C) (07/02 0800) Pulse Rate:  [47-120] 57 (07/02 0900) Cardiac Rhythm: Normal sinus rhythm (07/02 0800) Resp:  [11-45] 18 (07/02 0900) BP: (105-154)/(40-107) 112/51 (07/02 0900) SpO2:  [95 %-100 %] 98 % (07/02 0900) Weight:  [63.2 kg (139 lb 5.3 oz)-80.7 kg (177 lb 14.6 oz)] 80.7 kg (177 lb 14.6 oz) (07/01 1330)  CBC:  Recent Labs Lab 10/17/16 1011  WBC 8.2  NEUTROABS 5.7  HGB 13.9  HCT 40.4  MCV 94.3  PLT 353    Basic Metabolic Panel:  Recent Labs Lab 10/17/16 1011  NA 140  K 4.3  CL 104  CO2 30  GLUCOSE 95  BUN 17  CREATININE 1.27*  CALCIUM 9.6    Lipid Panel:    Component Value Date/Time   CHOL 136 10/18/2016 0230   TRIG 63 10/18/2016 0230   HDL 57 10/18/2016 0230   CHOLHDL 2.4 10/18/2016 0230   VLDL 13 10/18/2016 0230   LDLCALC 66 10/18/2016 0230   HgbA1c: No results found for: HGBA1C Urine Drug Screen: No results found for: LABOPIA, COCAINSCRNUR, LABBENZ, AMPHETMU, THCU, LABBARB  Alcohol Level No results found for: [place] I have personally reviewed the radiological images below and agree with the radiology interpretations.  Ct Angio Head W Or Wo Contrast 10/17/2016 IMPRESSION: No large vessel occlusion. Peripheral calcification throughout both carotid siphons with stenosis estimated at 30-50% bilaterally. Atherosclerotic calcification of the right vertebral artery at the foramen magnum with stenosis estimated at 50-70% in that  segment. 50% stenosis of the distal basilar artery. No branch vessel occlusion.   Ct Head Code Stroke W/o Cm 10/17/2016 IMPRESSION: 1. No acute finding by CT. Atrophy and chronic small vessel ischemic changes. Old right parietal cortical and subcortical infarction. Extensive sinus inflammatory disease on the right. Possible mucoceles. 2. ASPECTS is 10.   Mr Brain Wo Contrast 10/18/2016 IMPRESSION: 1. Scattered acute infarctions in the right MCA territory, most confluent about the upper central sulcus where there is  petechial hemorrhage. 2. Background of moderate to advanced chronic small vessel ischemia. Cerebral atrophy that has progressed from 2011. 3. Chronic sinusitis from right middle meatus obstruction   CUS pending  TTE pending   PHYSICAL EXAM  Temp:  [97.7 F (36.5 C)-98.5 F (36.9 C)] 98.5 F (36.9 C) (07/02 0800) Pulse Rate:  [47-92] 58 (07/02 1300) Resp:  [12-45] 12 (07/02 1300) BP: (105-154)/(46-89) 119/63 (07/02 1200) SpO2:  [95 %-100 %] 98 % (07/02 1300)  General - Well nourished, well developed, sleepy and lethargic.  Ophthalmologic - Fundi not visualized due to noncooperation.  Cardiovascular - irregular heartbeat with frequent premature beats, on telemetry showed frequent PACs and PVCs.  Neuro - sleepy and lethargic, dysarthria, paucity of speech, but able to say "I don't know", not following commands. Not cooperative on naming or repeating. PERRL, EOMI, seems to have left neglect, no blink to visual threat on the left. Left facial droop, tongue midline in mouth. Left upper extremity 0/5, right upper extremity spontaneous movement, and localized pain. Right lower extremity at least 4/5, left lower extremity 3-/5. Left Babinski possibly, DTR 1+. Sensation, coordination and gait not tested.   ASSESSMENT/PLAN Mr. Kelly Lloyd is a 83 y.o. male with history of atrial flutter (s/p ablation in 2013) and dementia presenting to [facility] ED 10/17/2016 with left arm weakness after a fall at home. He received tPA at 1030 on 10/17/2016.   Stroke - right MCA scattered infarcts, etiology unclear. DDX include; 1. Right carotid stenosis - CUS pending 2. afib not on [facility] - although patient had ablation in the past, however, due to high CHADS2-VASc score, he may need to be on anticoagulation 3. Hypercoagulable state - patient had prominent weight loss, advanced malignancy is a possibility  4. Paradoxical emboli - patient most of time bedridden, concerning for DVT    Resultant  Left hemiparesis,  cognitive decline  CT head: No acute finding by CT. Old right parietal cortical and subcortical infarction  MRI head: right MCA scattered infarcts  CTA head b/l siphon atherosclerosis, 50% BA stenosis, right VA athero with stenosis, left VA ends up at PICA  Carotid Doppler  pending  2D Echo  pending  Will consider LE venous doppler and pan CT depending on above test and family wishes  LDL 62  HgbA1c pending  SCDs for VTE prophylaxis  Diet regular Room service appropriate? Yes; Fluid consistency: Thin  No antithrombotic prior to admission, now on ASA 24 hours after tPA.  Patient and family counseled to be compliant with his antithrombotic medications  Ongoing aggressive stroke risk factor management  Therapy recommendations:  pending  Disposition:  Pending  Afib s/p RFCA not on AC  CHA2DS2-VASc score = 4      Suppose to be on the [facility] given the CHA2DS2-VASc score  Pt not following with cardiology now  Need a further discussion with wife regarding anticoagulation   Dementia   Follows with [facility]  Had recent further cognitive decline  Baseline poor at home  On Aricept and Namenda  Sundowning at home, will monitor here, consider seroquel if needed.  Weight loss  40 lbs in 6 months as per wife  Wife said pt eats well at home  Consider pan CT to rule out malignancy if family desires  Hypertension  Stable  Permissive hypertension (OK if < 180/105) but gradually normalize in 5-7 days  Long-term BP goal normotensive  Other Stroke Risk Factors  Advanced age  Hx stroke/TIA by imaging  Other Active Problems  Elevated Cre  Hospital day # 1  This patient is critically ill due to right MCA infarct s/p tPA, dementia, afib s/p ablation and at significant risk of neurological worsening, death form recurrent stroke, PE, fall, heart failure, seizure, hemorrhage. This patient's care requires constant monitoring of vital signs, hemodynamics,  respiratory and cardiac monitoring, review of multiple databases, neurological assessment, discussion with family, other specialists and medical decision making of high complexity. I spent 45 minutes of neurocritical care time in the care of this patient. I had long discussion with wife and son at bedside, updated pt current condition, treatment plan and potential prognosis.   [name], MD PhD Stroke Neurology 10/18/2016 2:12 PM    To contact Stroke Continuity provider, please refer to [email or web address] After hours, contact General Neurology

## 2016-10-18 NOTE — Progress Notes (Signed)
SLP Cancellation Note  Patient Details Name: Kelly Lloyd MRN: [id number] DOB: 1927/10/15   Cancelled treatment:       Reason Eval/Treat Not Completed: Fatigue/lethargy limiting ability to participate. RN reports that pt is not fully alert since receiving ativan, but that family reports he is not far from his cognitive-linguistic baseline. MRI pending. SLP will plan to f/u after MRI has been completed, when pt is more alert, and when family can provide more information about baseline level of function.   Germain Osgood 10/18/2016, 10:11 AM  Germain Osgood, M.A. CCC-SLP [phone]

## 2016-10-18 NOTE — Progress Notes (Signed)
OT Cancellation Note  Patient Details Name: Roran Wegner Medley MRN: [id number] DOB: 05/13/1927   Cancelled Treatment:    Reason Eval/Treat Not Completed: Patient not medically ready. Pt with active bedrest orders. Will await increase in activity orders prior to initiating OT evaluation. Thank you for this referral!   Norman Herrlich, MS OTR/L  Pager: [place] [facility] [place] 10/18/2016, 7:16 AM

## 2016-10-18 NOTE — Progress Notes (Signed)
Preliminary results by tech - Carotid Duplex Completed. Right ICA demonstrated a moderate amount of smooth, homogeneous plaque suggestive of 60-79% stenosis based on 2D/gray scale imaging. Left ICA demonstrated 1-39% stenosis. Oda Cogan, BS, RDMS, RVT

## 2016-10-18 NOTE — Evaluation (Signed)
Physical Therapy Evaluation Patient Details Name: Kelly Lloyd MRN: [id number] DOB: Dec 22, 1927 Today's Date: 10/18/2016   History of Present Illness  Pt is an 83 y.o. male who was found down at home with L sided weakness and was taken to [facility] and was subsequently transferred to [facility]. He was administered tPA at 11:30 on 10/17/16. He has a PMH significant for atrial flutter (s/p ablation in 2013) and Alzheimer's dementia. MRI revealed R MCA territory scattered infarcts.   Clinical Impression  Orders received for PT evaluation. Patient demonstrates deficits in functional mobility as indicated below. Will benefit from continued skilled PT to address deficits and maximize function. Will see as indicated and progress as tolerated.  At this time, recommending comprehensive therapies to maximize functional recovery as patient as immense family support and was highly active prior to admission despite hx of dementia. Patient was going to the gym regularly walking on treadmill and very independent around the home. Feel intensive therapies will provide the best chance for meaning recovery and function.    Follow Up Recommendations CIR    Equipment Recommendations   (TBD)    Recommendations for Other Services Rehab consult     Precautions / Restrictions Precautions Precautions: Fall Restrictions Weight Bearing Restrictions: No      Mobility  Bed Mobility Overal bed mobility: Needs Assistance Bed Mobility: Supine to Sit;Sit to Supine     Supine to sit: Max assist Sit to supine: Max assist;+2 for physical assistance   General bed mobility comments: Patient required max assist to elevate trunk to upright and rotate to EOB. ABle to show some initiation of LE movement (right) to EOB but requires multimodal cues and increased time, increased physical assist to bring LLE to EOB and elevate/rotate.  Upon return to bed patient showing initiation of bilateral LEs but unable to clear LLE up into  bed, required assist to return to supine and reposition.  Transfers Overall transfer level: Needs assistance Equipment used:  (posey belt and wrap around support) Transfers: Sit to/from Stand Sit to Stand: +2 physical assistance;Max assist         General transfer comment: +2 max assist to power up to standing, patient able to take on weight through bilateral LEs and push to power up. Increased effort to perform. Performed x3 during session. Patient able to maintain upright with one person physical assist and LE blocking.  Ambulation/Gait             General Gait Details: deferred at this time  Stairs            Wheelchair Mobility    Modified Rankin (Stroke Patients Only) Modified Rankin (Stroke Patients Only) Pre-Morbid Rankin Score: No symptoms Modified Rankin: Severe disability     Balance Overall balance assessment: Needs assistance Sitting-balance support: No upper extremity supported;Feet supported Sitting balance-Leahy Scale: Poor     Standing balance support: Bilateral upper extremity supported;During functional activity Standing balance-Leahy Scale: Poor                               Pertinent Vitals/Pain Pain Assessment: Faces Faces Pain Scale: No hurt    Home Living Family/patient expects to be discharged to:: Private residence Living Arrangements: Spouse/significant other Available Help at Discharge: Family Type of Home: House Home Access: Stairs to enter   Technical brewer of Steps: 2 Home Layout: Two level;Bed/bath upstairs Home Equipment: Shower seat - built in  Prior Function Level of Independence: Independent         Comments: Has dementia, at baseline, but he is able to feed himself, wash himself, can go to gym with wife and walk on treadmill. Per wife has been pretty independent at home despite dementia     Hand Dominance   Dominant Hand: Right    Extremity/Trunk Assessment   Upper Extremity  Assessment Upper Extremity Assessment: LUE deficits/detail;Difficult to assess due to impaired cognition LUE Deficits / Details: Trace muscle activation in L grasp and at elbow. PROM WFL.     Lower Extremity Assessment Lower Extremity Assessment: LLE deficits/detail;Difficult to assess due to impaired cognition LLE Deficits / Details: noted active movement LLE 2+/5 but unable to isolate in testing. Patient is able to take on weight in functional standing with some knee flexion noted LLE Coordination: decreased fine motor;decreased gross motor       Communication   Communication: Expressive difficulties  Cognition Arousal/Alertness: Awake/alert (lethargic post-activity) Behavior During Therapy: Restless Overall Cognitive Status: Impaired/Different from baseline Area of Impairment: Orientation;Attention;Memory;Following commands;Safety/judgement;Awareness;Problem solving                 Orientation Level: Disoriented to;Place;Time;Situation Current Attention Level: Focused   Following Commands: Follows one step commands inconsistently;Follows one step commands with increased time Safety/Judgement: Decreased awareness of safety;Decreased awareness of deficits Awareness: Intellectual Problem Solving: Slow processing;Decreased initiation;Difficulty sequencing;Requires verbal cues;Requires tactile cues General Comments: Inattention to left side noted. Able to respond well to verbal and tactile cues to look and attend left, poor awareness of left extremities.       General Comments      Exercises     Assessment/Plan    PT Assessment Patient needs continued PT services  PT Problem List Decreased strength;Decreased activity tolerance;Decreased balance;Decreased mobility;Decreased coordination;Decreased cognition;Decreased safety awareness;Impaired sensation       PT Treatment Interventions DME instruction;Gait training;Stair training;Functional mobility training;Therapeutic  activities;Therapeutic exercise;Balance training;Neuromuscular re-education;Cognitive remediation;Patient/family education;Modalities    PT Goals (Current goals can be found in the Care Plan section)  Acute Rehab PT Goals Patient Stated Goal: per family to get better PT Goal Formulation: With family Time For Goal Achievement: 11/01/16 Potential to Achieve Goals: Good    Frequency Min 4X/week   Barriers to discharge        Co-evaluation PT/OT/SLP Co-Evaluation/Treatment: Yes Reason for Co-Treatment: For patient/therapist safety;Complexity of the patient's impairments (multi-system involvement) PT goals addressed during session: Mobility/safety with mobility         AM-PAC PT "6 Clicks" Daily Activity  Outcome Measure Difficulty turning over in bed (including adjusting bedclothes, sheets and blankets)?: Total Difficulty moving from lying on back to sitting on the side of the bed? : Total Difficulty sitting down on and standing up from a chair with arms (e.g., wheelchair, bedside commode, etc,.)?: Total Help needed moving to and from a bed to chair (including a wheelchair)?: Total Help needed walking in hospital room?: Total Help needed climbing 3-5 steps with a railing? : Total 6 Click Score: 6    End of Session Equipment Utilized During Treatment: Gait belt (posey belt use as gait belt) Activity Tolerance: Patient tolerated treatment well Patient left: in bed;with call bell/phone within reach;with bed alarm set;with family/visitor present;with restraints reapplied Nurse Communication: Mobility status PT Visit Diagnosis: Difficulty in walking, not elsewhere classified (R26.2);Apraxia (R48.2);Other symptoms and signs involving the nervous system (R29.898);Hemiplegia and hemiparesis Hemiplegia - Right/Left: Left Hemiplegia - dominant/non-dominant: Non-dominant Hemiplegia - caused by: Cerebral infarction  Time: 9324-1991 PT Time Calculation (min) (ACUTE ONLY): 34  min   Charges:   PT Evaluation $PT Eval Moderate Complexity: 1 Procedure     PT G Codes:        Alben Deeds, PT DPT NCS [phone]   Duncan Dull 10/18/2016, 6:13 PM Alben Deeds, [place] DPT NCS [phone]

## 2016-10-18 NOTE — Progress Notes (Signed)
Initial Nutrition Assessment  DOCUMENTATION CODES:   Severe malnutrition in context of chronic illness  INTERVENTION:   Ensure Enlive po TID, each supplement provides 350 kcal and 20 grams of protein   NUTRITION DIAGNOSIS:   Malnutrition (Severe) related to chronic illness (dementia) as evidenced by severe depletion of body fat, severe depletion of muscle mass.  GOAL:   Patient will meet greater than or equal to 90% of their needs  MONITOR:   PO intake, Supplement acceptance, Weight trends  REASON FOR ASSESSMENT:   Malnutrition Screening Tool    ASSESSMENT:   Pt with PMH of Alzheimer's dementia admitted with possible infarct resulting in L ar m paralysis s/p tPA, MRI pending.    Per RN pt ate 100% of his dinner 7/1. He did not eat breakfast as he was too lethargic due to ativan given earlier.  Spoke with wife, daughter and son. They report that his usual weight is 180 lb and he has steadily declined to 132 lb at his last physical which was last week. At the time the physician did not recommend any oral nutrition supplements as weight loss is part of his disease process.  Per wife pt eats really well. Always 3 meals per day. Breakfast is cereal, Lunch is always out and he gets and eats a full meal. Dinner is left-overs or out to eat and he again eats a full plate. He is unable to order and make decisions so wife always orders his food when they are out to eat.  They feel his weight loss has been more pronounced since March/April of this year.  Family feels that he would like ensure as he likes milkshakes.   Nutrition-Focused physical exam completed. Findings are severe fat depletion, severe muscle depletion, and no edema.  Medications reviewed and include: NS @ 100 ml/hr  CBG: 89  Diet Order:  Diet regular Room service appropriate? Yes; Fluid consistency: Thin  Skin:  Reviewed, no issues  Last BM:  unknown  Height:   Ht Readings from Last 1 Encounters:  10/17/16 6'  (1.829 m)    Weight:   Wt Readings from Last 1 Encounters:  10/17/16 177 lb 14.6 oz (80.7 kg)  Bedscale not accurate Last known weight 132 lb (60 kg)  Ideal Body Weight:  80.9 kg  BMI:  Body mass index is 24.13 kg/m.  Estimated Nutritional Needs:   Kcal:  1600-1800  Protein:  90-100 grams  Fluid:  > 1.6 L/day  EDUCATION NEEDS:   No education needs identified at this time  [place], [place], [place] Pager [phone] After Hours Pager

## 2016-10-19 ENCOUNTER — Encounter (HOSPITAL_COMMUNITY): Payer: Self-pay

## 2016-10-19 ENCOUNTER — Inpatient Hospital Stay (HOSPITAL_COMMUNITY): Payer: Medicare Other

## 2016-10-19 DIAGNOSIS — Z9889 Other specified postprocedural states: Secondary | ICD-10-CM

## 2016-10-19 DIAGNOSIS — G309 Alzheimer's disease, unspecified: Secondary | ICD-10-CM

## 2016-10-19 DIAGNOSIS — I4892 Unspecified atrial flutter: Secondary | ICD-10-CM

## 2016-10-19 DIAGNOSIS — I69354 Hemiplegia and hemiparesis following cerebral infarction affecting left non-dominant side: Secondary | ICD-10-CM

## 2016-10-19 DIAGNOSIS — F0280 Dementia in other diseases classified elsewhere without behavioral disturbance: Secondary | ICD-10-CM

## 2016-10-19 LAB — CBC
HEMATOCRIT: 42.6 % (ref 39.0–52.0)
Hemoglobin: 14 g/dL (ref 13.0–17.0)
MCH: 31.7 pg (ref 26.0–34.0)
MCHC: 32.9 g/dL (ref 30.0–36.0)
MCV: 96.6 fL (ref 78.0–100.0)
PLATELETS: 218 10*3/uL (ref 150–400)
RBC: 4.41 MIL/uL (ref 4.22–5.81)
RDW: 12.8 % (ref 11.5–15.5)
WBC: 10.4 10*3/uL (ref 4.0–10.5)

## 2016-10-19 LAB — BASIC METABOLIC PANEL
ANION GAP: 8 (ref 5–15)
BUN: 11 mg/dL (ref 6–20)
CALCIUM: 9.3 mg/dL (ref 8.9–10.3)
CO2: 24 mmol/L (ref 22–32)
CREATININE: 1.03 mg/dL (ref 0.61–1.24)
Chloride: 106 mmol/L (ref 101–111)
GFR calc Af Amer: >60 mL/min (ref >60)
GLUCOSE: 83 mg/dL (ref 65–99)
Potassium: 4.3 mmol/L (ref 3.5–5.1)
Sodium: 138 mmol/L (ref 135–145)

## 2016-10-19 LAB — VAS US CAROTID
LEFT VERTEBRAL DIAS: -10 cm/s
LICADDIAS: -13 cm/s
LICADSYS: -73 cm/s
LICAPDIAS: -11 cm/s
LICAPSYS: -61 cm/s
Left CCA dist dias: 12 cm/s
Left CCA dist sys: 89 cm/s
Left CCA prox dias: 15 cm/s
Left CCA prox sys: 127 cm/s
RCCAPDIAS: 11 cm/s
RIGHT ECA DIAS: -9 cm/s
RIGHT VERTEBRAL DIAS: 8 cm/s
Right CCA prox sys: 88 cm/s
Right cca dist sys: -80 cm/s

## 2016-10-19 LAB — HEMOGLOBIN A1C
HEMOGLOBIN A1C: 5.7 % — AB (ref 4.8–5.6)
MEAN PLASMA GLUCOSE: 117 mg/dL

## 2016-10-19 MED ORDER — IOPAMIDOL (ISOVUE-370) INJECTION 76%
INTRAVENOUS | Status: AC
  Administered 2016-10-19: 50 mL
  Filled 2016-10-19: qty 50

## 2016-10-19 MED ORDER — QUETIAPINE FUMARATE 25 MG PO TABS
25.0000 mg | ORAL_TABLET | Freq: Every day | ORAL | Status: DC
  Administered 2016-10-19: 25 mg via ORAL
  Filled 2016-10-19: qty 1

## 2016-10-19 MED ORDER — PANTOPRAZOLE SODIUM 40 MG PO TBEC
40.0000 mg | DELAYED_RELEASE_TABLET | Freq: Every day | ORAL | Status: DC
  Administered 2016-10-19 – 2016-10-20 (×2): 40 mg via ORAL
  Filled 2016-10-19 (×2): qty 1

## 2016-10-19 NOTE — Progress Notes (Signed)
STROKE TEAM PROGRESS NOTE   SUBJECTIVE (INTERVAL HISTORY)  His son, wife and granddaughter are at bedside.  The patient's mental status and left leg strength are improved today.  He is awake and alert, and feeding himself breakfast.  The need for CTA neck and anticoagulation with Eliquis was discussed, and the family understood and are agreeable to these treatments. However, wife and son are hesitant with testing to rule out cancer at this time.   OBJECTIVE Temp:  [97.5 F (36.4 C)-98.4 F (36.9 C)] 97.5 F (36.4 C) (07/03 0800) Pulse Rate:  [46-86] 68 (07/03 0900) Cardiac Rhythm: Normal sinus rhythm (07/03 0400) Resp:  [12-25] 14 (07/03 0900) BP: (96-163)/(47-113) 125/62 (07/03 0900) SpO2:  [96 %-99 %] 97 % (07/03 0900)  CBC:   Recent Labs Lab 10/17/16 1011 10/19/16 0221  WBC 8.2 10.4  NEUTROABS 5.7  --   HGB 13.9 14.0  HCT 40.4 42.6  MCV 94.3 96.6  PLT 237 734    Basic Metabolic Panel:   Recent Labs Lab 10/17/16 1011 10/19/16 0221  NA 140 138  K 4.3 4.3  CL 104 106  CO2 30 24  GLUCOSE 95 83  BUN 17 11  CREATININE 1.27* 1.03  CALCIUM 9.6 9.3    Lipid Panel:     Component Value Date/Time   CHOL 136 10/18/2016 0230   TRIG 63 10/18/2016 0230   HDL 57 10/18/2016 0230   CHOLHDL 2.4 10/18/2016 0230   VLDL 13 10/18/2016 0230   LDLCALC 66 10/18/2016 0230   HgbA1c:  Lab Results  Component Value Date   HGBA1C 5.7 (H) 10/18/2016   Urine Drug Screen: No results found for: LABOPIA, COCAINSCRNUR, LABBENZ, AMPHETMU, THCU, LABBARB  Alcohol Level No results found for: [place] I have personally reviewed the radiological images below and agree with the radiology interpretations.  Ct Angio Head W Or Wo Contrast 10/17/2016 IMPRESSION: No large vessel occlusion. Peripheral calcification throughout both carotid siphons with stenosis estimated at 30-50% bilaterally. Atherosclerotic calcification of the right vertebral artery at the foramen magnum with stenosis  estimated at 50-70% in that segment. 50% stenosis of the distal basilar artery. No branch vessel occlusion.   Ct Head Code Stroke W/o Cm 10/17/2016 IMPRESSION: 1. No acute finding by CT. Atrophy and chronic small vessel ischemic changes. Old right parietal cortical and subcortical infarction. Extensive sinus inflammatory disease on the right. Possible mucoceles. 2. ASPECTS is 10.   Mr Brain Wo Contrast 10/18/2016 IMPRESSION: 1. Scattered acute infarctions in the right MCA territory, most confluent about the upper central sulcus where there is petechial hemorrhage. 2. Background of moderate to advanced chronic small vessel ischemia. Cerebral atrophy that has progressed from 2011. 3. Chronic sinusitis from right middle meatus obstruction  CUS pending 10/19/2016 1. Findings are suggestive of 60-79% stenosis in the right internal carotid based on 2D/gray scale imaging and 1-39% stenosis in the left internal carotid artery.Both vertebral arteries show antegrade flow pattern.  TTE pending 10/18/2016 Left ventricle: The cavity size was normal. Wall thickness was normal. Systolic function was vigorous. The estimated ejection fraction was in the range of 65% to 70%.  Impressions:Poor acoustic windows. Study mainly done from subcostal view  CT Angio Neck pending    PHYSICAL EXAM  Temp:  [97.5 F (36.4 C)-98.4 F (36.9 C)] 97.5 F (36.4 C) (07/03 0800) Pulse Rate:  [46-86] 68 (07/03 0900) Resp:  [12-25] 14 (07/03 0900) BP: (96-163)/(47-113) 125/62 (07/03 0900) SpO2:  [96 %-99 %] 97 % (07/03 0900)  General - Well nourished, well developed, sleepy and lethargic.  Ophthalmologic - Fundi not visualized due to noncooperation.  Cardiovascular - irregular heartbeat with frequent premature beats, on telemetry showed frequent PACs and PVCs.  Neuro - awake and alert, dysarthria, paucity of speech, but able to say "I don't know",  following most simple commands. Not able to name or repeat. PERRL, EOMI,  left hemianopia vs. Left visual neglect, no blink to visual threat on the left. Left facial droop, tongue midline in mouth. Left upper extremity 0/5, right upper extremity spontaneous movement, and 4/5 at least. Right lower extremity at least 3/5, left lower extremity 3/5. Left Babinski possibly, DTR 1+. Sensation, coordination not cooperative and gait not tested.   ASSESSMENT/PLAN Mr. Jerome Viglione Banning is a 83 y.o. male with history of atrial flutter (s/p ablation in 2013) and dementia presenting to [facility] ED 10/17/2016 with left arm weakness after a fall at home. He received tPA at 1030 on 10/17/2016.   Stroke - right MCA scattered infarcts, etiology likely right ICA stenosis vs. afib not on AC. However, hypercoagulable state due to malignancy or paradoxical emboli can not be completely ruled out at this time.   Resultant  Left arm paresis, baseline dementia  CT head: No acute finding by CT. Old right parietal cortical and subcortical infarction  MRI head: right MCA scattered infarcts  CTA head b/l siphon atherosclerosis, 50% BA stenosis, right VA athero with stenosis, left VA ends up at PICA  Carotid Doppler right ICA 60-79% stenosis  CTA neck pending  2D Echo EF 65-70%  Family so far declined LE venous doppler and pan CT at this time.  LDL 66  HgbA1c 5.7  lovenox for VTE prophylaxis Diet regular Room service appropriate? Yes; Fluid consistency: Thin  No antithrombotic prior to admission, now on ASA 325mg . Due to hx of afib and high CHA2DS2-VASc score, recommend anticoagulation. Will start eliquis 5mg  bid at day 5 due to moderate size stroke, once eliquis started, ASA can be discontinued.   Patient and family counseled to be compliant with his antithrombotic medications  Ongoing aggressive stroke risk factor management  Therapy recommendations:  CIR;Supervision/Assistance - 24 hour   Disposition:  Pending  Afib s/p RFCA not on AC  CHA2DS2-VASc score = 4      Supposed to be on the  [facility] given the CHA2DS2-VASc score  Pt not following with cardiology now  Discussed anticoagulation with family , and will likely start patient on Eliquis 5mg  bid in 5 days. Meantime, ASA 325mg  for bridge.  Right ICA stenosis  CUS - right ICA 60-79% stenosis  CTA neck - pending  Depending on CTA neck result, will consider VVS consult. However, pt likely not a good candidate for CEA.   Dementia   Follows with [facility]  Had recent further cognitive decline  Baseline poor at home  On Aricept and Namenda  Sundowning at home - put on seroquel 25mg  QHs  Weight loss  40 lbs in 6 months as per wife  Wife said pt eats well at home  Family currently declined further work up for malignancy.  Hypertension  Stable  Permissive hypertension (OK if < 180/105) but gradually normalize in 5-7 days  Long-term BP goal normotensive  Other Stroke Risk Factors  Advanced age  Hx stroke/TIA by imaging  Other [facility] day # 2  This patient is critically ill due to right MCA infarct s/p tPA, dementia, afib s/p ablation and at significant  risk of neurological worsening, death form recurrent stroke, PE, fall, heart failure, seizure, hemorrhage. This patient's care requires constant monitoring of vital signs, hemodynamics, respiratory and cardiac monitoring, review of multiple databases, neurological assessment, discussion with family, other specialists and medical decision making of high complexity. I spent 35 minutes of neurocritical care time in the care of this patient. I had long discussion with wife and son at bedside, updated pt current condition, treatment plan and potential prognosis.   [name], MD PhD Stroke Neurology 10/19/2016 11:18 AM    To contact Stroke Continuity provider, please refer to [email or web address] After hours, contact General Neurology

## 2016-10-19 NOTE — Progress Notes (Signed)
Rehab Admissions Coordinator Note:  Patient was screened by Retta Diones for appropriateness for an Inpatient Acute Rehab Consult.  At this time, we are recommending Inpatient Rehab consult.  Jodell Cipro M 10/19/2016, 9:05 AM  I can be reached at [phone].

## 2016-10-19 NOTE — Progress Notes (Signed)
SLP Cancellation Note  Patient Details Name: Kelly Lloyd MRN: [id number] DOB: 02/17/1928   Cancelled treatment:       Reason Eval/Treat Not Completed: Other (comment) Pt transferring floors - will f/u as able.   Germain Osgood 10/19/2016, 3:56 PM  Germain Osgood, M.A. CCC-SLP [phone]

## 2016-10-19 NOTE — Consult Note (Signed)
Physical Medicine and Rehabilitation Consult Reason for Consult: Left side weakness Referring Physician: Dr. Erlinda Hong   HPI: Kelly Lloyd is a 83 y.o. right handed male with history of atrial flutter, dementia maintained on Namenda. Per chart review and family, patient lives with spouse independent prior to admission. At baseline he was able to feed himself and wash . 2 level home bath and bedroom upstairs with 2 steps to entry. Presented 10/17/2016 after being found down with left-sided weakness. Patient presented to [facility]. CT of the brain showed no acute intracranial process.  TPA was administered. Transferred to [facility] for further evaluation. MRI reviewed, showing multiple right infarcts >posterior. Per report, scattered acute infarcts in the right MCA territory most confluent about the upper central sulcus where there was petechial hemorrhage. CT angiogram of the head showed no large vessel occlusion. 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 No branch vessel occlusion. Echocardiogram with ejection fraction of 70%. Carotid Dopplers right ICA demonstrated a moderate amount of smooth homogenous plaque suggestive of 60-79% stenosis left ICA demonstrated 1-39% stenosis. Neurology consulted presently on aspirin for CVA prophylaxis. Subcutaneous Lovenox for DVT prophylaxis. Tolerating a regular diet. Physical occupational therapy evaluations completed with recommendations of physical medicine rehabilitation consult.   Review of Systems  Unable to perform ROS: Acuity of condition   Past Medical History:  Diagnosis Date  . Atrial flutter ([place])    aflutter s/p RFCA  . Benign prostatic hypertrophy   . BPH (benign prostatic hyperplasia)   . Dementia   . Diverticulosis of colon    Past Surgical History:  Procedure Laterality Date  . [place]  . audiol eval  06/07/02  . BACK SURGERY  2008  . COLONOSCOPY  03/08/06   2 polyps, divertics, int ext hemorrhoids  . Hemm excision  04/07/10   Dr. Bary Castilla  . HEMORRHOID SURGERY  03/08/06   [place]  . HOSP [facility]  10/2--22/10   aflutter ablation. TEE EF 55% 02/06/09  . [place]  . MRI INT  2/204   auit canals wnl except Mastoiditis  . TRANSURETHRAL RESECTION OF PROSTATE  2011   Family History  Problem Relation Age of Onset  . Thyroid disease Unknown        family Hx  . Stroke Neg Hx        neg hx of CV  . Diabetes Neg Hx        DM; also neg hx of HBP  . Cancer Neg Hx        breast, ovarian, uterine  . Depression Neg Hx   . Alcohol abuse Neg Hx        no drug abuse    Social History:  reports that he quit smoking about 42 years ago. He has a 24.00 pack-year smoking history. He has never used smokeless tobacco. He reports that he does not drink alcohol or use drugs. Allergies: No Known Allergies Medications Prior to Admission  Medication Sig Dispense Refill  . donepezil (ARICEPT) 10 MG tablet Take 10 mg by mouth daily.      Marland Kitchen dutasteride (AVODART) 0.5 MG capsule Take 0.5 mg by mouth daily.    . memantine (NAMENDA) 10 MG tablet Take 10 mg by mouth 2 (two) times daily.      Home: Home Living Family/patient expects to be discharged to:: Private residence Living Arrangements: Spouse/significant other Available Help at Discharge:  Family Type of Home: House Home Access: Stairs to enter Technical brewer of Steps: 2 Home Layout: Two level, Bed/bath upstairs Alternate Level Stairs-Number of Steps: 12 Alternate Level Stairs-Rails: Can reach both Bathroom Shower/Tub: Gaffer (built-in shower seat) Biochemist, clinical: Standard Home Equipment: Shower seat - built in  Functional History: Prior Function Level of Independence: Independent Comments: Has dementia, at baseline, but he is able to feed himself, wash himself, can go to gym with wife and walk on treadmill. Per wife has been pretty independent at home despite  dementia Functional Status:  Mobility: Bed Mobility Overal bed mobility: Needs Assistance Bed Mobility: Supine to Sit, Sit to Supine Supine to sit: Max assist Sit to supine: Max assist, +2 for physical assistance General bed mobility comments: Patient required max assist to elevate trunk to upright and rotate to EOB. ABle to show some initiation of LE movement (right) to EOB but requires multimodal cues and increased time, increased physical assist to bring LLE to EOB and elevate/rotate.  Upon return to bed patient showing initiation of bilateral LEs but unable to clear LLE up into bed, required assist to return to supine and reposition. Transfers Overall transfer level: Needs assistance Equipment used:  (posey belt and wrap around support) Transfers: Sit to/from Stand Sit to Stand: +2 physical assistance, Max assist General transfer comment: +2 max assist to power up to standing, patient able to take on weight through bilateral LEs and push to power up. Increased effort to perform. Performed x3 during session. Patient able to maintain upright with one person physical assist and LE blocking. Ambulation/Gait General Gait Details: deferred at this time    ADL: ADL Overall ADL's : Needs assistance/impaired Eating/Feeding: Moderate assistance, Bed level Eating/Feeding Details (indicate cue type and reason): Family reports decreased accuracy to target when feeding himself.  Grooming: Maximal assistance, Sitting Upper Body Bathing: Maximal assistance, Sitting Lower Body Bathing: Total assistance, Sit to/from stand Upper Body Dressing : Maximal assistance, Sitting Lower Body Dressing: Total assistance, Sit to/from Lobbyist Details (indicate cue type and reason): Able to complete sit<>stand in preparation for toilet transfers with max assist +2. Toileting- Clothing Manipulation and Hygiene: Total assistance, Sit to/from stand Functional mobility during ADLs: Moderate assistance,  +2 for safety/equipment General ADL Comments: Pt able to follow commands during ADL participation with increased time.   Cognition: Cognition Overall Cognitive Status: Impaired/Different from baseline Orientation Level: Oriented to person, Disoriented to situation, Disoriented to place, Disoriented to time Cognition Arousal/Alertness: Awake/alert (lethargic post-activity) Behavior During Therapy: Restless Overall Cognitive Status: Impaired/Different from baseline Area of Impairment: Orientation, Attention, Memory, Following commands, Safety/judgement, Awareness, Problem solving Orientation Level: Disoriented to, Place, Time, Situation Current Attention Level: Focused Following Commands: Follows one step commands inconsistently, Follows one step commands with increased time Safety/Judgement: Decreased awareness of safety, Decreased awareness of deficits Awareness: Intellectual Problem Solving: Slow processing, Decreased initiation, Difficulty sequencing, Requires verbal cues, Requires tactile cues General Comments: Inattention to left side noted. Able to respond well to verbal and tactile cues to look and attend left, poor awareness of left extremities.   Blood pressure (!) 147/74, pulse 78, temperature 97.9 F (36.6 C), temperature source Axillary, resp. rate 17, height 6' (1.829 m), weight 80.7 kg (177 lb 14.6 oz), SpO2 97 %. Physical Exam  Vitals reviewed. Constitutional: He appears well-developed and well-nourished.  HENT:  Head: Normocephalic and atraumatic.  Eyes: EOM are normal. Right eye exhibits no discharge. Left eye exhibits no discharge.  Neck: Normal range of motion. Neck  supple. No thyromegaly present.  Cardiovascular: Normal rate and regular rhythm.   Respiratory: Effort normal and breath sounds normal. No respiratory distress.  GI: Soft. Bowel sounds are normal. He exhibits no distension.  Musculoskeletal: He exhibits no edema or tenderness.  Neurological: He is alert.   Makes eye contact with examiner.  Unable to follow commands (baseline) He did have some spontaneous speech.  Motor: not moving LUE/LLE  Dtrs symmetric Neg clonus, hoffman's  Skin: Skin is warm and dry.  Psychiatric:  Pleasantly demented    Results for orders placed or performed during the hospital encounter of 10/17/16 (from the past 24 hour(s))  CBC     Status: None   Collection Time: 10/19/16  2:21 AM  Result Value Ref Range   WBC 10.4 4.0 - 10.5 K/uL   RBC 4.41 4.22 - 5.81 MIL/uL   Hemoglobin 14.0 13.0 - 17.0 g/dL   HCT 42.6 39.0 - 52.0 %   MCV 96.6 78.0 - 100.0 fL   MCH 31.7 26.0 - 34.0 pg   MCHC 32.9 30.0 - 36.0 g/dL   RDW 12.8 11.5 - 15.5 %   Platelets 218 150 - 400 K/uL  Basic metabolic panel     Status: None   Collection Time: 10/19/16  2:21 AM  Result Value Ref Range   Sodium 138 135 - 145 mmol/L   Potassium 4.3 3.5 - 5.1 mmol/L   Chloride 106 101 - 111 mmol/L   CO2 24 22 - 32 mmol/L   Glucose, Bld 83 65 - 99 mg/dL   BUN 11 6 - 20 mg/dL   Creatinine, Ser 1.03 0.61 - 1.24 mg/dL   Calcium 9.3 8.9 - 10.3 mg/dL   GFR calc non Af Amer >60 >60 mL/min   GFR calc Af Amer >60 >60 mL/min   Anion gap 8 5 - 15   Ct Angio Head W Or Wo Contrast  Result Date: 10/17/2016 CLINICAL DATA:  Acute presentation with left arm weakness. Negative acute CT earlier today. EXAM: CT ANGIOGRAPHY HEAD TECHNIQUE: Multidetector CT imaging of the head was performed using the standard protocol during bolus administration of intravenous contrast. Multiplanar CT image reconstructions and MIPs were obtained to evaluate the vascular anatomy. CONTRAST:  75 cc Isovue 370 COMPARISON:  Head CT earlier same day FINDINGS: CTA HEAD Anterior circulation: Both internal carotid arteries are patent through the siphon regions. There is peripheral siphon calcification with stenosis estimated at 30-50% bilaterally. Anterior and middle cerebral vessels are patent without proximal stenosis or detectable missing vessel.  Posterior circulation: Both vertebral arteries are patent at the foramen magnum. There is extensive calcification of the right vertebral artery at the foramen magnum with stenosis estimated at 50-70% but patency beyond that. Small left vertebral terminates in PICA up. There is 50% stenosis of the distal basilar artery. Superior cerebellar and posterior cerebral vessels do show flow. Venous sinuses: Patent and normal Anatomic variants: None significant Delayed phase: No abnormal enhancement IMPRESSION: No large vessel occlusion. Peripheral calcification throughout both carotid siphons with stenosis estimated at 30-50% bilaterally. Atherosclerotic calcification of the right vertebral artery at the foramen magnum with stenosis estimated at 50-70% in that segment. 50% stenosis of the distal basilar artery. No branch vessel occlusion. These results were called by telephone at the time of interpretation on 10/17/2016 at 12:24 pm to Dr. Lavonia Drafts , who verbally acknowledged these results. Electronically Signed   By: Nelson Chimes M.D.   On: 10/17/2016 12:26   Mr Brain Wo Contrast  Result Date: 10/18/2016 CLINICAL DATA:  Acute ischemic stroke. Left-sided weakness. Status post tPA. EXAM: MRI HEAD WITHOUT CONTRAST TECHNIQUE: Multiplanar, multiecho pulse sequences of the brain and surrounding structures were obtained without intravenous contrast. COMPARISON:  CTA of the head 10/17/2016.  Brain MRI 05/09/2009 FINDINGS: Brain: Confluent cortically based infarction at the right frontal parietal junction with largest contiguous area of infarction measuring nearly 3 cm in length. There are smaller patchy cortical and white matter infarcts along the right cerebral convexity in the MCA distribution. Tiny acute infarct in the right caudate nucleus. Mild perirolandic petechial hemorrhage. Generalized cerebral volume loss with moderate medial temporal atrophy, progressed from 2011. Patient has history of dementia. Chronic small  vessel ischemia with confluent gliosis in the cerebral white matter. Vascular: Major flow voids are preserved. Skull and upper cervical spine: Negative for marrow lesion Sinuses/Orbits: There is complete opacification of the right frontal, anterior ethmoid, and maxillary sinuses with internal inspissated secretions. IMPRESSION: 1. Scattered acute infarctions in the right MCA territory, most confluent about the upper central sulcus where there is petechial hemorrhage. 2. Background of moderate to advanced chronic small vessel ischemia. Cerebral atrophy that has progressed from 2011. 3. 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   On: 10/18/2016 13:09   Ct Head Code Stroke W/o Cm  Result Date: 10/17/2016 CLINICAL DATA:  Code stroke. Fell last night. Left arm weakness, worsening today. EXAM: CT HEAD WITHOUT CONTRAST TECHNIQUE: Contiguous axial images were obtained from the base of the skull through the vertex without intravenous contrast. COMPARISON:  Brain MRI 05/09/2009. FINDINGS: Brain: Chronic small-vessel ischemic changes affect the pons. No focal cerebellar finding. Cerebral hemispheres show generalized atrophy with chronic small-vessel ischemic changes of the thalami and white matter. Old right parietal cortical and subcortical infarction. No sign of acute infarction, mass lesion, hemorrhage, hydrocephalus or extra-axial collection. Vascular: There is atherosclerotic calcification of the major vessels at the base of the brain. Skull: Negative Sinuses/Orbits: Opacification of the right maxillary, ethmoid and frontal sinuses consistent with chronic sinus inflammation. Mucocele not excluded. Orbits negative. Other: None significant ASPECTS ([place] Stroke Program Early CT Score) - Ganglionic level infarction (caudate, lentiform nuclei, internal capsule, insula, M1-M3 cortex): 7 - Supraganglionic infarction (M4-M6 cortex): 3 Total score (0-10 with 10  being normal): 10 IMPRESSION: 1. No acute finding by CT. Atrophy and chronic small vessel ischemic changes. Old right parietal cortical and subcortical infarction. Extensive sinus inflammatory disease on the right. Possible mucoceles. 2. ASPECTS is 10. These results were called by telephone at the time of interpretation on 10/17/2016 at 10:35 am to Dr. Lavonia Drafts , who verbally acknowledged these results. Electronically Signed   By: Nelson Chimes M.D.   On: 10/17/2016 10:37    Assessment/Plan: Diagnosis: Right MCA CVA Labs and images independently reviewed.  Records reviewed and summated above. Stroke: Continue secondary stroke prophylaxis and Risk Factor Modification listed below:   Antiplatelet therapy:   Blood Pressure Management:  Continue current medication with prn's with permisive HTN per primary team Statin Agent:   Left sided hemiparesis: fit for orthosis to prevent contractures (resting hand splint for day, wrist cock up splint at night, PRAFO, etc) PT/OT for mobility, ADL training  Motor recovery: Fluoxetine  1. Does the need for close, 24 hr/day medical supervision in concert with the patient's rehab needs make it unreasonable for this patient to be served in a less intensive setting? Potentially  2. Co-Morbidities requiring supervision/potential complications: atrial  flutter (monitor HR with increased activity), dementia (cont meds), BPH (monitor for retention) 3. Due to safety, disease management and patient education, does the patient require 24 hr/day rehab nursing? Yes 4. Does the patient require coordinated care of a physician, rehab nurse, PT (1-2 hrs/day, 5 days/week) and OT (1-2 hrs/day, 5 days/week) to address physical and functional deficits in the context of the above medical diagnosis(es)? Yes Addressing deficits in the following areas: balance, endurance, locomotion, strength, transferring, bathing, dressing, toileting and psychosocial support 5. Can the patient actively  participate in an intensive therapy program of at least 3 hrs of therapy per day at least 5 days per week? Yes 6. The potential for patient to make measurable gains while on inpatient rehab is excellent 7. Anticipated functional outcomes upon discharge from inpatient rehab are mod assist  with PT, mod assist with OT, n/a with SLP. 8. Estimated rehab length of stay to reach the above functional goals is: 25-30 days. 9. Anticipated D/C setting: Other 10. Anticipated post D/C treatments: HH therapy and Home excercise program 11. Overall Rehab/Functional Prognosis: good and fair  RECOMMENDATIONS: This patient's condition is appropriate for continued rehabilitative care in the following setting: Will consider CIR admission to decrease burden of care Patient has agreed to participate in recommended program. Potentially Note that insurance prior authorization may be required for reimbursement for recommended care.  Comment: Rehab Admissions Coordinator to follow up.  Delice Lesch, MD, Mellody Drown Cathlyn Parsons., PA-C 10/19/2016

## 2016-10-19 NOTE — Progress Notes (Signed)
PT Cancellation Note  Patient Details Name: Kelly Lloyd MRN: [id number] DOB: 20-Dec-1927   Cancelled Treatment:    Reason Eval/Treat Not Completed: Patient at procedure or test/unavailable Pt currently being transferred to CT and then to another floor. Will follow up as time allows.   Marguarite Arbour A Princella Jaskiewicz 10/19/2016, 2:47 PM Wray Kearns, [place], DPT [phone]

## 2016-10-20 DIAGNOSIS — I6521 Occlusion and stenosis of right carotid artery: Secondary | ICD-10-CM

## 2016-10-20 LAB — CBC
HCT: 42.3 % (ref 39.0–52.0)
Hemoglobin: 13.8 g/dL (ref 13.0–17.0)
MCH: 31.4 pg (ref 26.0–34.0)
MCHC: 32.6 g/dL (ref 30.0–36.0)
MCV: 96.1 fL (ref 78.0–100.0)
PLATELETS: 215 10*3/uL (ref 150–400)
RBC: 4.4 MIL/uL (ref 4.22–5.81)
RDW: 12.9 % (ref 11.5–15.5)
WBC: 9.4 10*3/uL (ref 4.0–10.5)

## 2016-10-20 LAB — BASIC METABOLIC PANEL
Anion gap: 7 (ref 5–15)
BUN: 12 mg/dL (ref 6–20)
CO2: 27 mmol/L (ref 22–32)
Calcium: 9.3 mg/dL (ref 8.9–10.3)
Chloride: 105 mmol/L (ref 101–111)
Creatinine, Ser: 1.01 mg/dL (ref 0.61–1.24)
GFR calc Af Amer: >60 mL/min (ref >60)
GLUCOSE: 92 mg/dL (ref 65–99)
POTASSIUM: 3.7 mmol/L (ref 3.5–5.1)
Sodium: 139 mmol/L (ref 135–145)

## 2016-10-20 MED ORDER — QUETIAPINE FUMARATE 25 MG PO TABS
25.0000 mg | ORAL_TABLET | Freq: Every day | ORAL | Status: DC
  Administered 2016-10-20: 25 mg via ORAL
  Filled 2016-10-20: qty 1

## 2016-10-20 NOTE — Progress Notes (Signed)
Physical Therapy Treatment Patient Details Name: Kelly Lloyd MRN: [id number] DOB: [birth year] Today's Date: 10/20/2016    History of Present Illness Pt is an 83 y.o. male who was found down at home with L sided weakness and was taken to [facility] and was subsequently transferred to [facility]. He was administered tPA at 11:30 on 10/17/16. He has a PMH significant for atrial flutter (s/p ablation in 2013) and Alzheimer's dementia. MRI revealed R MCA territory scattered infarcts.     PT Comments    Pt presented supine in bed with HOB elevated, awake and willing to participate in therapy session. Pt continues to require two person assist with bed mobility. Focus of session was on sitting balance, weight shifting in sitting, cognition and improved attention to L side. Pt's family present throughout session as well. All questions re: CIR were answered at end of session. Pt continues to be an excellent candidate for CIR and can easily tolerate intensive therapy sessions.   Follow Up Recommendations  CIR     Equipment Recommendations  None recommended by PT    Recommendations for Other Services Rehab consult     Precautions / Restrictions Precautions Precautions: Fall Restrictions Weight Bearing Restrictions: No    Mobility  Bed Mobility Overal bed mobility: Needs Assistance Bed Mobility: Supine to Sit;Sit to Supine;Rolling Rolling: Mod assist   Supine to sit: Max assist;+2 for physical assistance Sit to supine: Max assist;+2 for physical assistance   General bed mobility comments: pt required increased time, multimodal cueing, max A to elevate trunk and use of bed pads to position pt's hips at EOB. Pt able to assist some with bilateral LEs. Pt also required max A x2 with bilateral LEs and trunk to return to supine  Transfers                 General transfer comment: focus of session was sitting balance, cognition and improved attention to L  Ambulation/Gait                  Stairs            Wheelchair Mobility    Modified Rankin (Stroke Patients Only) Modified Rankin (Stroke Patients Only) Pre-Morbid Rankin Score: No symptoms Modified Rankin: Severe disability     Balance Overall balance assessment: Needs assistance Sitting-balance support: No upper extremity supported;Feet supported Sitting balance-Leahy Scale: Poor Sitting balance - Comments: pt fluctuating between requiring max A to close min guard with cueing and weight shifting Postural control: Posterior lean                                  Cognition Arousal/Alertness: Awake/alert Behavior During Therapy: WFL for tasks assessed/performed Overall Cognitive Status: History of cognitive impairments - at baseline Area of Impairment: Attention;Memory;Safety/judgement;Following commands;Awareness;Problem solving                   Current Attention Level: Focused Memory: Decreased short-term memory Following Commands: Follows one step commands inconsistently;Follows one step commands with increased time Safety/Judgement: Decreased awareness of safety;Decreased awareness of deficits Awareness: Intellectual Problem Solving: Slow processing;Decreased initiation;Difficulty sequencing;Requires verbal cues;Requires tactile cues General Comments: pt with dementia at baseline. Required multimodal cueing throughout.      Exercises      General Comments        Pertinent Vitals/Pain Pain Assessment: No/denies pain    Home Living   Living Arrangements: Spouse/significant other Available Help  at Discharge: Family;Available 24 hours/day (wife will have to speak with 3 stepchildren to assist)       Home Layout: Two level;Bed/bath upstairs;Able to live on main level with bedroom/bathroom        Prior Function            PT Goals (current goals can now be found in the care plan section) Acute Rehab PT Goals PT Goal Formulation: With family Time For Goal  Achievement: 11/01/16 Potential to Achieve Goals: Good Progress towards PT goals: Progressing toward goals    Frequency    Min 4X/week      PT Plan Current plan remains appropriate    Co-evaluation PT/OT/SLP Co-Evaluation/Treatment: Yes Reason for Co-Treatment: To address functional/ADL transfers;For patient/therapist safety PT goals addressed during session: Strengthening/ROM;Balance;Mobility/safety with mobility        AM-PAC PT "6 Clicks" Daily Activity  Outcome Measure  Difficulty turning over in bed (including adjusting bedclothes, sheets and blankets)?: Total Difficulty moving from lying on back to sitting on the side of the bed? : Total Difficulty sitting down on and standing up from a chair with arms (e.g., wheelchair, bedside commode, etc,.)?: Total Help needed moving to and from a bed to chair (including a wheelchair)?: Total Help needed walking in hospital room?: Total Help needed climbing 3-5 steps with a railing? : Total 6 Click Score: 6    End of Session   Activity Tolerance: Patient tolerated treatment well Patient left: in bed;with call bell/phone within reach;with bed alarm set;with family/visitor present;with restraints reapplied;Other (comment) (Posey belt) Nurse Communication: Mobility status PT Visit Diagnosis: Difficulty in walking, not elsewhere classified (R26.2);Apraxia (R48.2);Other symptoms and signs involving the nervous system (R29.898);Hemiplegia and hemiparesis Hemiplegia - Right/Left: Left Hemiplegia - dominant/non-dominant: Non-dominant Hemiplegia - caused by: Cerebral infarction     Time: 1330-1415 PT Time Calculation (min) (ACUTE ONLY): 45 min  Charges:  $Therapeutic Activity: 23-37 mins                    G Codes:       [place], PT, DPT [place] 10/20/2016, 2:24 PM

## 2016-10-20 NOTE — PMR Pre-admission (Signed)
PMR Admission Coordinator Pre-Admission Assessment  Patient: Kelly Lloyd is an 83 y.o., male MRN: [id number] DOB: 1927/11/29 Height: 6' (182.9 cm) Weight: 80.7 kg (177 lb 14.6 oz)              Insurance Information HMO:     PPO: yes     PCP:      IPA:      80/20:      OTHER: medicare advantage plan PRIMARY: Blue Medicare      Policy#: [id number]      Subscriber: pt CM Name: Pieter Partridge.       Phone#: [phone]     Fax#: [phone] Pre-Cert#: [id number]      Employer: retired Benefits:  Phone #: [phone]     Name: 10/21/16 Eff. Date: 04/19/16     Deduct: none      Out of Pocket Max: [phone]      Life Max: none CIR: $300 co pay per day days 1-6      SNF: no co pay days 1-20; $167.50 co pay per day days 21-60; no co pay days 61-100 Outpatient: $40 co pay per visit     Co-Pay: visits per medical neccesity Home Health: 100%      Co-Pay: visits per medical neccesity DME: 80%     Co-Pay: 20% Providers:  In network  SECONDARY:  none        Medicaid Application Date:       Case Manager:  Disability Application Date:       Case Worker:   Emergency Contact Information Contact Information    Name Relation Home Work Mobile   Kelly Lloyd Spouse [phone]  [phone]     Current Medical History  Patient Admitting Diagnosis: right MCA CVA  History of Present Illness:  Kelly Lloyd Driveris a 83 y.o.right handed malewith history of atrial flutter, dementia maintained on Namenda.  Presented 10/17/2016 after being found down with left-sided weakness. Patient presented to [facility]. CT of the brain showed no acute intracranial process. TPA was administered. Transferred to [facility] for further evaluation. MRI reviewed, showing multiple right infarcts >posterior. Per report, scattered acute infarcts in the right MCA territory most confluent about the upper central sulcus where there was petechial hemorrhage. CT angiogram of the head showed no large vessel occlusion.  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 No branch vessel occlusion. Echocardiogram with ejection fraction of 70%. Carotid Dopplers right ICA demonstrated a moderate amount of smooth homogenous plaque suggestive of 60-79% stenosis left ICA demonstrated 1-39% stenosis. Neurology consulted presently on aspirin for CVA prophylaxis. Subcutaneous Lovenox for DVT prophylaxis. Tolerating a regular diet.   Total: 12 NIHSS  Past Medical History  Past Medical History:  Diagnosis Date  . Atrial flutter ([place])    aflutter s/p RFCA  . Benign prostatic hypertrophy   . BPH (benign prostatic hyperplasia)   . Dementia   . Diverticulosis of colon     Family History  family history is not on file.  Prior Rehab/Hospitalizations:  Has the patient had major surgery during 100 days prior to admission? No  Current Medications   Current Facility-Administered Medications:  .  0.9 %  sodium chloride infusion, , Intravenous, Continuous, [name], MD, Last Rate: 40 mL/hr at 10/21/16 0528 .  acetaminophen (TYLENOL) tablet 650 mg, 650 mg, Oral, Q4H PRN, 650 mg at 10/21/16 0052 **OR** acetaminophen (TYLENOL) solution 650 mg, 650 mg, Per Tube, Q4H PRN **OR** acetaminophen (TYLENOL)  suppository 650 mg, 650 mg, Rectal, Q4H PRN, [name], MD .  aspirin tablet 325 mg, 325 mg, Oral, Daily, [name], MD, 325 mg at 10/21/16 0959 .  donepezil (ARICEPT) tablet 10 mg, 10 mg, Oral, QHS, [name], [name], MD, 10 mg at 10/21/16 0959 .  dutasteride (AVODART) capsule 0.5 mg, 0.5 mg, Oral, Daily, [name], [name], MD, 0.5 mg at 10/20/16 2124 .  enoxaparin (LOVENOX) injection 40 mg, 40 mg, Subcutaneous, Q24H, [name], MD, 40 mg at 10/20/16 2123 .  feeding supplement (ENSURE ENLIVE) (ENSURE ENLIVE) liquid 237 mL, 237 mL, Oral, TID BM, [name], MD, 237 mL at 10/21/16 0959 .  memantine (NAMENDA) tablet 10 mg, 10 mg, Oral, BID, [name], MD, 10 mg at 10/21/16 0959 .  pantoprazole  (PROTONIX) EC tablet 40 mg, 40 mg, Oral, Daily, [name], [name], RPH, 40 mg at 10/20/16 2037 .  QUEtiapine (SEROQUEL) tablet 25 mg, 25 mg, Oral, QHS, [name], MD, 25 mg at 10/20/16 2037 .  senna-docusate (Senokot-S) tablet 1 tablet, 1 tablet, Oral, QHS PRN, [name], MD  Patients Current Diet: Diet regular Room service appropriate? Yes; Fluid consistency: Thin  Precautions / Restrictions Precautions Precautions: Fall Restrictions Weight Bearing Restrictions: No   Has the patient had 2 or more falls or a fall with injury in the past year?No  Prior Activity Level Community (5-7x/wk): pt and wife go to gym 3 times per week; go out to lunch daily  Development worker, international aid / [place] Devices/Equipment: None Home Equipment: Shower seat - built in  Prior Device Use: Indicate devices/aids used by the patient prior to current illness, exacerbation or injury? None of the above  Prior Functional Level Prior Function Level of Independence: Independent Comments: Has dementia, at baseline, but he is able to feed himself, wash himself, can go to gym with wife and walk on treadmill. Per wife has been pretty independent at home despite dementia  Self Care: Did the patient need help bathing, dressing, using the toilet or eating?  Independent. Wife states pt now does not like to shower; he will wash off only. Sometimes will not shave and will go to bed fully dressed at times.  Indoor Mobility: Did the patient need assistance with walking from room to room (with or without device)? Independent  Stairs: Did the patient need assistance with internal or external stairs (with or without device)? Independent  Functional Cognition: Did the patient need help planning regular tasks such as shopping or remembering to take medications? Needed some help wife hs had to take over administering his medications. 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 Wife  states he can not manage money or math any longer.   Current Functional Level Cognition  Arousal/Alertness: Awake/alert Overall Cognitive Status: History of cognitive impairments - at baseline Current Attention Level: Focused Orientation Level: Oriented to person Following Commands: Follows one step commands inconsistently, Follows one step commands with increased time Safety/Judgement: Decreased awareness of safety, Decreased awareness of deficits General Comments: pt with dementia at baseline. Required multimodal cueing throughout. Attention: Sustained Sustained Attention: Impaired Sustained Attention Impairment: Verbal basic Memory: Impaired Memory Impairment: Storage deficit, Retrieval deficit Awareness: Impaired Awareness Impairment: Intellectual impairment Problem Solving: Impaired Problem Solving Impairment: Verbal complex, Functional complex, Verbal basic    Extremity Assessment (includes Sensation/Coordination)  Upper Extremity Assessment: LUE deficits/detail, Difficult to assess due to impaired cognition LUE Deficits / Details: Trace muscle activation in L grasp and at elbow.  PROM WFL.   Lower Extremity Assessment: LLE deficits/detail, Difficult to assess due to impaired cognition LLE Deficits / Details: noted active movement LLE 2+/5 but unable to isolate in testing. Patient is able to take on weight in functional standing with some knee flexion noted LLE Coordination: decreased fine motor, decreased gross motor    ADLs  Overall ADL's : Needs assistance/impaired Eating/Feeding: Moderate assistance, Bed level Eating/Feeding Details (indicate cue type and reason): Family reports decreased accuracy to target when feeding himself.  Grooming: Wash/dry hands, Brushing hair, Maximal assistance, Sitting, Minimal assistance Grooming Details (indicate cue type and reason): Pt able to brush hair sitting EOB with set up and support for trunk to maintain sitting; Pt requires MAX hand  over hand assit for any ADL that require BUE Upper Body Bathing: Maximal assistance, Sitting Lower Body Bathing: Total assistance, Sit to/from stand Upper Body Dressing : Maximal assistance, Sitting Lower Body Dressing: Total assistance, Sit to/from Lobbyist Details (indicate cue type and reason): Able to complete sit<>stand in preparation for toilet transfers with max assist +2. Toileting- Clothing Manipulation and Hygiene: Total assistance, Sit to/from stand Functional mobility during ADLs: Moderate assistance, +2 for safety/equipment General ADL Comments: Pt has a very joking personality, his daughter states that he needs about 3 word sentences to be able to follow commands    Mobility  Overal bed mobility: Needs Assistance Bed Mobility: Supine to Sit, Sit to Supine, Rolling Rolling: Mod assist Supine to sit: Mod assist, +2 for physical assistance Sit to supine: Max assist, +2 for physical assistance General bed mobility comments: pt required increased time, multimodal cueing, assist to elevate trunk and use of bed pads to position pt's hips at EOB. Pt able to assist some with bilateral LEs. Pt also required max A x2 with bilateral LEs and trunk to return to supine and for positioning in bed    Transfers  Overall transfer level: Needs assistance Equipment used: 2 person hand held assist Transfers: Sit to/from Stand Sit to Stand: Mod assist, +2 physical assistance General transfer comment: increased time, multimodal cueing, mod A x2 to rise into standing from elevated bed position.     Ambulation / Gait / Stairs / Wheelchair Mobility  Ambulation/Gait General Gait Details: deferred at this time    Posture / Balance Dynamic Sitting Balance Sitting balance - Comments: pt required constant min guard with occasional posterior leaning, able to correct with cueing this session Balance Overall balance assessment: Needs assistance Sitting-balance support: No upper extremity  supported, Feet supported Sitting balance-Leahy Scale: Fair Sitting balance - Comments: pt required constant min guard with occasional posterior leaning, able to correct with cueing this session Postural control: Posterior lean Standing balance support: Bilateral upper extremity supported, During functional activity Standing balance-Leahy Scale: Poor Standing balance comment: mod A x2 with posterior lean, unable to correct with multimodal cueing    Special needs/care consideration BiPAP/CPAP  N/a CPM  N/a Continuous Drip IV  N/a Dialysis  N/a Life Vest  N/a Oxygen  N/a Special Bed  N/a Trach Size  N/a Wound Vac n/a Skin tear right hand; moisture associated skin areas to penis; ecchymosis bilateral arms Bowel mgmt: incontinent LBM 7/1 pta Bladder mgmt: condom catheter Diabetic mgmt n/a Waist belt for safety and bed and chair alarm Decreased safety awareness   Previous Home Environment Living Arrangements: Spouse/significant other  Lives With: Spouse Available Help at Discharge: Family, Available 24 hours/day (wife will have to speak with 3 stepchildren to assist) Type of Home:  House Home Layout: Two level, Bed/bath upstairs, Able to live on main level with bedroom/bathroom Alternate Level Stairs-Rails: Can reach both Alternate Level Stairs-Number of Steps: 12 Home Access: Stairs to enter [employer] of Steps: 2 Bathroom Shower/Tub: Multimedia programmer: Standard Bathroom Accessibility: Yes How Accessible: Accessible via walker Doffing: No  Discharge Living Setting Plans for Discharge Living Setting: Patient's home, Lives with (comment) (wife of 18 years) Type of Home at Discharge: House Discharge Home Layout: Two level, Bed/bath upstairs, Able to live on main level with bedroom/bathroom Alternate Level Stairs-Rails: Can reach both Alternate Level Stairs-Number of Steps: 12 Discharge Home Access: Stairs to enter Entrance Stairs-Rails:  None Entrance Stairs-Number of Steps: 2 Discharge Bathroom Shower/Tub: Horticulturist, commercial: Standard Discharge Bathroom Accessibility: Yes How Accessible: Accessible via walker Does the patient have any problems obtaining your medications?: No  Social/Family/Support Systems Patient Roles: Spouse, Parent Contact Information: Risk analyst, spouse Anticipated Caregiver: wife and children Anticipated Ambulance person Information: see above Ability/Limitations of Caregiver: wife can provide superivsion to light physical assist Caregiver Availability: 24/7 (wife will have to speak to 3 stepchildren to assist with 24/) Discharge Plan Discussed with Primary Caregiver: Yes Is Caregiver In Agreement with Plan?: Yes Does Caregiver/Family have Issues with Lodging/Transportation while Pt is in Rehab?: No  Goals/Additional Needs Patient/Family Goal for Rehab: min assist PT, OT, and SLP Expected length of stay: ELOS 10- 14 days Pt/Family Agrees to Admission and willing to participate: Yes Program Orientation Provided & Reviewed with Pt/Caregiver Including Roles  & Responsibilities: Yes  Decrease burden of Care through IP rehab admission: to see how pt progresses with a set  consistent schedule  Possible need for SNF placement upon discharge: possible if pt does not reach supervision to min assist level that wife can manage. Wife made aware on 10/20/16 that insurance not guaranteed to pay both CIR as well as SNF level rehabs.  Patient Condition: This patient's medical and functional status has changed since the consult dated: 10/19/2016 in which the Rehabilitation Physician determined and documented that the patient's condition is appropriate for intensive rehabilitative care in an inpatient rehabilitation facility. See "History of Present Illness" (above) for medical update. Functional changes are: mod to max assist. Patient's medical and functional status update has been discussed  with the Rehabilitation physician and patient remains appropriate for inpatient rehabilitation. Will admit to inpatient rehab today.  Preadmission Screen Completed By:  Cleatrice Burke, 10/21/2016 2:45 PM ______________________________________________________________________   Discussed status with Dr. Naaman Plummer on 10/21/2016 at  1445 and received telephone approval for admission today.  Admission Coordinator:  Cleatrice Burke, time 0938 Date 10/21/2016

## 2016-10-20 NOTE — Evaluation (Signed)
Speech Language Pathology Evaluation Patient Details Name: Kelly Lloyd MRN: [id number] DOB: 03-07-1928 Today's Date: 10/20/2016 Time: 0830- 0845    Problem List:  Patient Active Problem List   Diagnosis Date Noted  . Atrial flutter ([place])   . 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 Weight loss 10/18/2016  . Paroxysmal atrial fibrillation ([place]) 10/18/2016  . Status post catheter ablation of atrial fibrillation 10/18/2016  . Alzheimer's disease 10/18/2016  . Acute ischemic stroke ([place]) 10/17/2016  . Anal stricture 04/17/2014  . Rectal pain 04/17/2014  . Unspecified constipation 08/30/2012  . Medicare annual wellness visit, initial 01/19/2012  . Medically noncompliant 01/19/2012  . Post-nasal drip 01/19/2012  . Hemorrhoid 06/24/2011  . PENILE LESION 02/12/2010  . 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 HYPERCHOLESTEROLEMIA 02/10/2010  . MEMORY LOSS 02/05/2009  . DIVERTICULOSIS, COLON 12/15/2006  . BENIGN PROSTATIC HYPERTROPHY 12/15/2006  . OSTEOARTHRITIS, GENERALIZED, MULTIPLE JOINTS 12/15/2006   Past Medical History:  Past Medical History:  Diagnosis Date  . Atrial flutter ([place])    aflutter s/p RFCA  . Benign prostatic hypertrophy   . BPH (benign prostatic hyperplasia)   . Dementia   . Diverticulosis of colon    Past Surgical History:  Past Surgical History:  Procedure Laterality Date  . [place]  . audiol eval  06/07/02  . BACK SURGERY  2008  . COLONOSCOPY  03/08/06   2 polyps, divertics, int ext hemorrhoids  . Hemm excision  04/07/10   Dr. Bary Castilla  . HEMORRHOID SURGERY  03/08/06   [place]  . HOSP [facility]  10/2--22/10   aflutter ablation. TEE EF 55% 02/06/09  . [place]  . MRI INT  2/204   auit canals wnl except Mastoiditis  . TRANSURETHRAL RESECTION OF PROSTATE  2011   HPI:  Mr.Kelly Lloyd a 83 y.o.malewith history of atrial flutter (s/p ablation in 2013) and  dementia presenting to [facility] ED 10/17/2016 with left arm weakness after a fall at home. Hereceived tPA at 1030 on 10/17/2016. Dx with right MCA scattered infarcts.    Assessment / Plan / Recommendation Clinical Impression  Patient presents with cognitive impairments at baseline due to dementia characterized by language of confusion with decreased thought organization, decreased sustained attention, short term recall of information and problem solving. Family present and report that patient is now at baseline. No further acute SLP needs indicated. If patient discharges to SNF or CIR where SLP readily available, may benefit from short SLP f/u to assist family with compensatory techniques to  maximize independence with ADLs prior to d/c home with spouse.     SLP Assessment  SLP Recommendation/Assessment: Patient does not need any further Speech Lanaguage Pathology Services SLP Visit Diagnosis: Cognitive communication deficit (R41.841)    Follow Up Recommendations  None    Frequency and Duration           SLP Evaluation Cognition  Overall Cognitive Status: History of cognitive impairments - at baseline (per family) Arousal/Alertness: Awake/alert Orientation Level: Oriented to person;Disoriented to place;Disoriented to time;Disoriented to situation Attention: Sustained Sustained Attention: Impaired Sustained Attention Impairment: Verbal basic Memory: Impaired Memory Impairment: Storage deficit;Retrieval deficit Awareness: Impaired Awareness Impairment: Intellectual impairment Problem Solving: Impaired Problem Solving Impairment: Verbal complex;Functional complex;Verbal basic       Comprehension  Auditory Comprehension Overall Auditory Comprehension: Other (comment) (can follow basic 1-step directions in the context of a task) Reading Comprehension Reading Status: Not  tested    Expression Expression Primary Mode of Expression: Verbal Verbal Expression Overall Verbal Expression: Impaired  at baseline (language of confusion, decreased thought organization)   Oral / Motor  Oral Motor/Sensory Function Overall Oral Motor/Sensory Function: Within functional limits Motor Speech Overall Motor Speech: Appears within functional limits for tasks assessed   GO                   Gabriel Rainwater MA, CCC-SLP [phone]  Kelly Lloyd 10/20/2016, 11:02 AM

## 2016-10-20 NOTE — H&P (Signed)
Physical Medicine and Rehabilitation Admission H&P    Chief complaint: Weakness  HPI: Kelly Lloyd is a 83 y.o. right handed male with history of atrial flutter with ablation 2013, dementia maintained on Namenda. Per chart review and family, patient lives with spouse independent prior to admission. At baseline he was able to feed himself and wash . 2 level home bath and bedroom upstairs with 2 steps to entry. Presented 10/17/2016 after being found down with left-sided weakness. Patient presented to [facility]. CT of the brain showed no acute intracranial process.  TPA was administered. Transferred to [facility] for further evaluation. MRI reviewed, showing multiple right infarcts >posterior. Per report, scattered acute infarcts in the right MCA territory most confluent about the upper central sulcus where there was petechial hemorrhage. CT angiogram of the head showed no large vessel occlusion. 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 No branch vessel occlusion. Echocardiogram with ejection fraction of 70%. Carotid Dopplers right ICA demonstrated a moderate amount of smooth homogenous plaque suggestive of 60-79% stenosis left ICA demonstrated 1-39% stenosis. Neurology consulted presently on aspirin for CVA prophylaxis. Subcutaneous Lovenox for DVT prophylaxis. Vascular surgery consulted Dr. Deitra Mayo in regards to right carotid stenosis and did not feel patient was a candidate for carotid surgery at this time due to his advanced dementia. Tolerating a regular diet. Physical occupational therapy evaluations completed with recommendations of physical medicine rehabilitation consult. Patient was admitted for a comprehensive rehabilitation program  Review of Systems  Unable to perform ROS: Acuity of condition   Past Medical History:  Diagnosis Date  . Atrial flutter ([place])    aflutter s/p RFCA  . Benign prostatic hypertrophy   . BPH  (benign prostatic hyperplasia)   . Dementia   . Diverticulosis of colon    Past Surgical History:  Procedure Laterality Date  . [place]  . audiol eval  06/07/02  . BACK SURGERY  2008  . COLONOSCOPY  03/08/06   2 polyps, divertics, int ext hemorrhoids  . Hemm excision  04/07/10   Dr. Bary Castilla  . HEMORRHOID SURGERY  03/08/06   [place]  . HOSP [facility]  10/2--22/10   aflutter ablation. TEE EF 55% 02/06/09  . [place]  . MRI INT  2/204   auit canals wnl except Mastoiditis  . TRANSURETHRAL RESECTION OF PROSTATE  2011   Family History  Problem Relation Age of Onset  . Thyroid disease Unknown        family Hx  . Stroke Neg Hx        neg hx of CV  . Diabetes Neg Hx        DM; also neg hx of HBP  . Cancer Neg Hx        breast, ovarian, uterine  . Depression Neg Hx   . Alcohol abuse Neg Hx        no drug abuse    Social History:  reports that he quit smoking about 42 years ago. He has a 24.00 pack-year smoking history. He has never used smokeless tobacco. He reports that he does not drink alcohol or use drugs. Allergies: No Known Allergies Medications Prior to Admission  Medication Sig Dispense Refill  . donepezil (ARICEPT) 10 MG tablet Take 10 mg by mouth daily.      Marland Kitchen dutasteride (AVODART) 0.5 MG capsule Take 0.5 mg by mouth daily.    . memantine (NAMENDA)  10 MG tablet Take 10 mg by mouth 2 (two) times daily.      Home: Home Living Family/patient expects to be discharged to:: Private residence Living Arrangements: Spouse/significant other Available Help at Discharge: Family Type of Home: House Home Access: Stairs to enter Technical brewer of Steps: 2 Home Layout: Two level, Bed/bath upstairs Alternate Level Stairs-Number of Steps: 12 Alternate Level Stairs-Rails: Can reach both Bathroom Shower/Tub: Gaffer (built-in shower seat) Biochemist, clinical: Standard Home Equipment: Shower seat - built in   Functional History: Prior  Function Level of Independence: Independent Comments: Has dementia, at baseline, but he is able to feed himself, wash himself, can go to gym with wife and walk on treadmill. Per wife has been pretty independent at home despite dementia  Functional Status:  Mobility: Bed Mobility Overal bed mobility: Needs Assistance Bed Mobility: Supine to Sit, Sit to Supine Supine to sit: Max assist Sit to supine: Max assist, +2 for physical assistance General bed mobility comments: Patient required max assist to elevate trunk to upright and rotate to EOB. ABle to show some initiation of LE movement (right) to EOB but requires multimodal cues and increased time, increased physical assist to bring LLE to EOB and elevate/rotate.  Upon return to bed patient showing initiation of bilateral LEs but unable to clear LLE up into bed, required assist to return to supine and reposition. Transfers Overall transfer level: Needs assistance Equipment used:  (posey belt and wrap around support) Transfers: Sit to/from Stand Sit to Stand: +2 physical assistance, Max assist General transfer comment: +2 max assist to power up to standing, patient able to take on weight through bilateral LEs and push to power up. Increased effort to perform. Performed x3 during session. Patient able to maintain upright with one person physical assist and LE blocking. Ambulation/Gait General Gait Details: deferred at this time    ADL: ADL Overall ADL's : Needs assistance/impaired Eating/Feeding: Moderate assistance, Bed level Eating/Feeding Details (indicate cue type and reason): Family reports decreased accuracy to target when feeding himself.  Grooming: Maximal assistance, Sitting Upper Body Bathing: Maximal assistance, Sitting Lower Body Bathing: Total assistance, Sit to/from stand Upper Body Dressing : Maximal assistance, Sitting Lower Body Dressing: Total assistance, Sit to/from Lobbyist Details (indicate cue type and  reason): Able to complete sit<>stand in preparation for toilet transfers with max assist +2. Toileting- Clothing Manipulation and Hygiene: Total assistance, Sit to/from stand Functional mobility during ADLs: Moderate assistance, +2 for safety/equipment General ADL Comments: Pt able to follow commands during ADL participation with increased time.   Cognition: Cognition Overall Cognitive Status: History of cognitive impairments - at baseline (per family) Arousal/Alertness: Awake/alert Orientation Level: Oriented to person Attention: Sustained Sustained Attention: Impaired Sustained Attention Impairment: Verbal basic Memory: Impaired Memory Impairment: Storage deficit, Retrieval deficit Awareness: Impaired Awareness Impairment: Intellectual impairment Problem Solving: Impaired Problem Solving Impairment: Verbal complex, Functional complex, Verbal basic Cognition Arousal/Alertness: Awake/alert (lethargic post-activity) Behavior During Therapy: Restless Overall Cognitive Status: History of cognitive impairments - at baseline (per family) Area of Impairment: Orientation, Attention, Memory, Following commands, Safety/judgement, Awareness, Problem solving Orientation Level: Disoriented to, Place, Time, Situation Current Attention Level: Focused Following Commands: Follows one step commands inconsistently, Follows one step commands with increased time Safety/Judgement: Decreased awareness of safety, Decreased awareness of deficits Awareness: Intellectual Problem Solving: Slow processing, Decreased initiation, Difficulty sequencing, Requires verbal cues, Requires tactile cues General Comments: Inattention to left side noted. Able to respond well to verbal and tactile cues to look and attend  left, poor awareness of left extremities.   Physical Exam: Blood pressure (!) 106/36, pulse 77, temperature 98 F (36.7 C), temperature source Oral, resp. rate 19, height 6' (1.829 m), weight 80.7 kg (177  lb 14.6 oz), SpO2 96 %. Physical Exam  Vitals reviewed. HENT:  Head: Normocephalic.  Eyes: EOM are normal.  Neck: Normal range of motion. Neck supple. No thyromegaly present.  Cardiovascular: Normal rate.  Exam reveals no friction rub.   No murmur heard. Cardiac rate control  Respiratory: Effort normal and breath sounds normal. No respiratory distress. He has no wheezes. He has no rales.  GI: Soft. Bowel sounds are normal. He exhibits no distension.  Skin. Warm and dry Musculoskeletal: He exhibits no edema or tenderness.  Neurological: He is alert. Remains pleasantly confused.   Makes eye contact with examiner.  He did have some spontaneous speech which is garbled. Follows commands inconsistently. Moves LLE to pain provocation. LUE 0/5. Left inattention  Motor: not moving LUE/LLE      Results for orders placed or performed during the hospital encounter of 10/17/16 (from the past 48 hour(s))  CBC     Status: None   Collection Time: 10/19/16  2:21 AM  Result Value Ref Range   WBC 10.4 4.0 - 10.5 K/uL   RBC 4.41 4.22 - 5.81 MIL/uL   Hemoglobin 14.0 13.0 - 17.0 g/dL   HCT 42.6 39.0 - 52.0 %   MCV 96.6 78.0 - 100.0 fL   MCH 31.7 26.0 - 34.0 pg   MCHC 32.9 30.0 - 36.0 g/dL   RDW 12.8 11.5 - 15.5 %   Platelets 218 150 - 400 K/uL  Basic metabolic panel     Status: None   Collection Time: 10/19/16  2:21 AM  Result Value Ref Range   Sodium 138 135 - 145 mmol/L   Potassium 4.3 3.5 - 5.1 mmol/L   Chloride 106 101 - 111 mmol/L   CO2 24 22 - 32 mmol/L   Glucose, Bld 83 65 - 99 mg/dL   BUN 11 6 - 20 mg/dL   Creatinine, Ser 1.03 0.61 - 1.24 mg/dL   Calcium 9.3 8.9 - 10.3 mg/dL   GFR calc non Af Amer >60 >60 mL/min   GFR calc Af Amer >60 >60 mL/min    Comment: (NOTE) The eGFR has been calculated using the CKD EPI equation. This calculation has not been validated in all clinical situations. eGFR's persistently <60 mL/min signify possible Chronic Kidney Disease.    Anion gap 8 5 -  15  CBC     Status: None   Collection Time: 10/20/16  4:52 AM  Result Value Ref Range   WBC 9.4 4.0 - 10.5 K/uL   RBC 4.40 4.22 - 5.81 MIL/uL   Hemoglobin 13.8 13.0 - 17.0 g/dL   HCT 42.3 39.0 - 52.0 %   MCV 96.1 78.0 - 100.0 fL   MCH 31.4 26.0 - 34.0 pg   MCHC 32.6 30.0 - 36.0 g/dL   RDW 12.9 11.5 - 15.5 %   Platelets 215 150 - 400 K/uL  Basic metabolic panel     Status: None   Collection Time: 10/20/16  4:52 AM  Result Value Ref Range   Sodium 139 135 - 145 mmol/L   Potassium 3.7 3.5 - 5.1 mmol/L   Chloride 105 101 - 111 mmol/L   CO2 27 22 - 32 mmol/L   Glucose, Bld 92 65 - 99 mg/dL   BUN 12 6 - 20  mg/dL   Creatinine, Ser 1.01 0.61 - 1.24 mg/dL   Calcium 9.3 8.9 - 10.3 mg/dL   GFR calc non Af Amer >60 >60 mL/min   GFR calc Af Amer >60 >60 mL/min    Comment: (NOTE) The eGFR has been calculated using the CKD EPI equation. This calculation has not been validated in all clinical situations. eGFR's persistently <60 mL/min signify possible Chronic Kidney Disease.    Anion gap 7 5 - 15   Ct Angio Neck W Or Wo Contrast  Result Date: 10/19/2016 CLINICAL DATA:  Right MCA stroke EXAM: CT ANGIOGRAPHY NECK TECHNIQUE: Multidetector CT imaging of the neck was performed using the standard protocol during bolus administration of intravenous contrast. Multiplanar CT image reconstructions and MIPs were obtained to evaluate the vascular anatomy. Carotid stenosis measurements (when applicable) are obtained utilizing NASCET criteria, using the distal internal carotid diameter as the denominator. CONTRAST:  50 cc Isovue 370 intravenous COMPARISON:  Head CTA and brain MRI 10/17/2016 and 10/18/2016 respectively FINDINGS: Aortic arch: Atheromatous wall thickening and calcification of the arch. Three vessel branching pattern. Right carotid system: Moderate predominately calcified plaque at the common carotid bifurcation. At the level of the ICA bulb is a noncalcified plaque leading to 60-70% stenosis. No  superimposed ulceration noted. Negative for dissection. Left carotid system: Moderate mixed density plaque at the common carotid bifurcation and ICA bulb without stenosis or ulceration. Vertebral arteries: Proximal subclavian atherosclerosis without flow limiting stenosis. Strongly dominant right vertebral artery. Right V1 segment plaque narrows the lumen to approximately 1 mm, ~80% stenosis. Left vertebral artery is patent to the left pica. Skeleton: Diffuse cervical disc degeneration and ridging. No acute or aggressive finding. Other neck: Chronic complete opacification of visualized right ethmoid and maxillary sinuses. There is right middle meatus obstruction pattern based on previous MRI. Medialization of the left vocal folds which can be a sign of paresis. No associated underlying mass lesion. Upper chest: Subpleural reticulation on the right, likely scarring. Intracranial findings described on CTA from 2 days prior IMPRESSION: 1. 65-70% atheromatous narrowing of the proximal right ICA. 2. ~80% narrowing of the dominant right V1 segment. 3. No visible ulcerated plaque. Electronically Signed   By: Monte Fantasia M.D.   On: 10/19/2016 16:07       Medical Problem List and Plan: 1.  Left side weakness secondary to right MCA infarct  -begin inpatient rehab therapies 2.  DVT Prophylaxis/Anticoagulation: Subcutaneous Lovenox. Monitor platelet counts and any signs of bleeding 3. Pain Management: Tylenol 4. Mood: Namenda 10 mg twice a day, Aricept 10 mg daily at bedtime  -change seroquel to low dose risperdal to help with sundowning which has been severe 5. Neuropsych: This patient is capable of making decisions on his own behalf. 6. Skin/Wound Care: Routine skin checks 7. Fluids/Electrolytes/Nutrition: Routine I&O with follow-up chemistries reviewed  -encourage PO 8. Right carotid stenosis. Follow-up vascular surgery. Not a surgical candidate at this time 9. Atrial flutter. Ablation 2013. Cardiac  rate controlled  -hgb normal, labs reviewed 10. BPH. Avodart 0.5 mg daily. Check PVR 3     Post Admission Physician Evaluation: 1. Functional deficits secondary  to right mca infarct. 2. Patient is admitted to receive collaborative, interdisciplinary care between the physiatrist, rehab nursing staff, and therapy team. 3. Patient's level of medical complexity and substantial therapy needs in context of that medical necessity cannot be provided at a lesser intensity of care such as a SNF. 4. Patient has experienced substantial functional loss from his/her baseline  which was documented above under the "Functional History" and "Functional Status" headings.  Judging by the patient's diagnosis, physical exam, and functional history, the patient has potential for functional progress which will result in measurable gains while on inpatient rehab.  These gains will be of substantial and practical use upon discharge  in facilitating mobility and self-care at the household level. 5. Physiatrist will provide 24 hour management of medical needs as well as oversight of the therapy plan/treatment and provide guidance as appropriate regarding the interaction of the two. 6. The Preadmission Screening has been reviewed and patient status is unchanged unless otherwise stated above. 7. 24 hour rehab nursing will assist with bladder management, bowel management, safety, skin/wound care, disease management, medication administration, pain management and patient education  and help integrate therapy concepts, techniques,education, etc. 8. PT will assess and treat for/with: Lower extremity strength, range of motion, stamina, balance, functional mobility, safety, adaptive techniques and equipment, NMR, cognitive perceptual rx, family ed.   Goals are: min assist. 9. OT will assess and treat for/with: ADL's, functional mobility, safety, upper extremity strength, adaptive techniques and equipment, NMR, family education, ego  support.   Goals are: min assist. Therapy may proceed with showering this patient. 10. SLP will assess and treat for/with: cognition, communication, swallowing.  Goals are: min assist. 11. Case Management and Social Worker will assess and treat for psychological issues and discharge planning. 12. Team conference will be held weekly to assess progress toward goals and to determine barriers to discharge. 13. Patient will receive at least 3 hours of therapy per day at least 5 days per week. 14. ELOS: 10-14 days       15. Prognosis:  excellent     Meredith Staggers, MD, [facility] 10/22/2016  Cathlyn Parsons., PA-C 10/20/2016

## 2016-10-20 NOTE — Progress Notes (Signed)
I met with pt's wife at bedside to discuss a possible inpt rehab admission pending insurance approval and bed availability. She will discuss with pt's children and I will follow up tomorrow. I will begin insurance approval in the morning when insurance open. [phone]

## 2016-10-20 NOTE — Progress Notes (Signed)
Occupational Therapy Treatment Patient Details Name: Kelly Lloyd MRN: [id number] DOB: 02/21/1928 Today's Date: 10/20/2016    History of present illness Pt is an 83 y.o. male who was found down at home with L sided weakness and was taken to [facility] and was subsequently transferred to [facility]. He was administered tPA at 11:30 on 10/17/16. He has a PMH significant for atrial flutter (s/p ablation in 2013) and Alzheimer's dementia. MRI revealed R MCA territory scattered infarcts.    OT comments  Pt making progress towards OT goals this session. Pt continues to demonstrate left side neglect, but improved tolerance for sitting EOB as way to perform ADL. Session done in conjuction with PT to work on weight shift, sitting balance, and cognition in. Pt continues to be an excellent candidate for CIR, eager to participate with therapy and has excellent family support. Pt was independent PTA, and requires CIR level therapy to maximize safety and independence.   Follow Up Recommendations  CIR;Supervision/Assistance - 24 hour    Equipment Recommendations  Other (comment) (defer to next venue)    Recommendations for Other Services      Precautions / Restrictions Precautions Precautions: Fall Restrictions Weight Bearing Restrictions: No       Mobility Bed Mobility Overal bed mobility: Needs Assistance Bed Mobility: Supine to Sit;Sit to Supine;Rolling Rolling: Mod assist   Supine to sit: Max assist;+2 for physical assistance Sit to supine: Max assist;+2 for physical assistance   General bed mobility comments: pt required increased time, multimodal cueing, max A to elevate trunk and use of bed pads to position pt's hips at EOB. Pt able to assist some with bilateral LEs. Pt also required max A x2 with bilateral LEs and trunk to return to supine and for positioning in bed  Transfers                 General transfer comment: focus of session was sitting balance, sustained core activation;  cognition and improved attention to L    Balance Overall balance assessment: Needs assistance Sitting-balance support: No upper extremity supported;Feet supported Sitting balance-Leahy Scale: Poor Sitting balance - Comments: pt fluctuating between requiring max A to close min guard with cueing and weight shifting Postural control: Posterior lean                                 ADL either performed or assessed with clinical judgement   ADL Overall ADL's : Needs assistance/impaired     Grooming: Wash/dry hands;Brushing hair;Maximal assistance;Sitting;Minimal assistance Grooming Details (indicate cue type and reason): Pt able to brush hair sitting EOB with set up and support for trunk to maintain sitting; Pt requires MAX hand over hand assit for any ADL that require BUE                             Functional mobility during ADLs: Moderate assistance;+2 for safety/equipment General ADL Comments: Pt has a very joking personality, his daughter states that he needs about 3 word sentences to be able to follow commands     Vision   Vision Assessment?: Vision impaired- to be further tested in functional context   Perception     Praxis      Cognition Arousal/Alertness: Awake/alert Behavior During Therapy: WFL for tasks assessed/performed Overall Cognitive Status: History of cognitive impairments - at baseline Area of Impairment: Attention;Memory;Safety/judgement;Following commands;Awareness;Problem solving  Current Attention Level: Focused Memory: Decreased short-term memory Following Commands: Follows one step commands inconsistently;Follows one step commands with increased time Safety/Judgement: Decreased awareness of safety;Decreased awareness of deficits Awareness: Intellectual Problem Solving: Slow processing;Decreased initiation;Difficulty sequencing;Requires verbal cues;Requires tactile cues General Comments: pt with dementia  at baseline. Required multimodal cueing throughout.        Exercises     Shoulder Instructions       General Comments Pt's daughter, wife, and step-daughter present during session    Pertinent Vitals/ Pain       Pain Assessment: No/denies pain  Home Living   Living Arrangements: Spouse/significant other Available Help at Discharge: Family;Available 24 hours/day (wife will have to speak with 3 stepchildren to assist)         Home Layout: Two level;Bed/bath upstairs;Able to live on main level with bedroom/bathroom     Bathroom Shower/Tub: Walk-in shower     Bathroom Accessibility: Yes How Accessible: Accessible via walker        Lives With: Spouse    Prior Functioning/Environment              Frequency  Min 3X/week        Progress Toward Goals  OT Goals(current goals can now be found in the care plan section)  Progress towards OT goals: Progressing toward goals  Acute Rehab OT Goals Patient Stated Goal: per family to get better OT Goal Formulation: With patient/family Time For Goal Achievement: 11/01/16 Potential to Achieve Goals: Good  Plan Discharge plan remains appropriate;Frequency remains appropriate    Co-evaluation    PT/OT/SLP Co-Evaluation/Treatment: Yes Reason for Co-Treatment: For patient/therapist safety;To address functional/ADL transfers PT goals addressed during session: Strengthening/ROM;Balance;Mobility/safety with mobility OT goals addressed during session: ADL's and self-care      AM-PAC PT "6 Clicks" Daily Activity     Outcome Measure   Help from another person eating meals?: A Lot Help from another person taking care of personal grooming?: A Lot Help from another person toileting, which includes using toliet, bedpan, or urinal?: Total Help from another person bathing (including washing, rinsing, drying)?: A Lot Help from another person to put on and taking off regular upper body clothing?: A Lot Help from another person  to put on and taking off regular lower body clothing?: Total 6 Click Score: 10    End of Session Equipment Utilized During Treatment:  (none)  OT Visit Diagnosis: Unsteadiness on feet (R26.81);Hemiplegia and hemiparesis;Cognitive communication deficit (R41.841) Symptoms and signs involving cognitive functions: Cerebral infarction Hemiplegia - Right/Left: Left Hemiplegia - dominant/non-dominant: Non-Dominant Hemiplegia - caused by: Cerebral infarction   Activity Tolerance Patient tolerated treatment well   Patient Left in bed;with call bell/phone within reach;with restraints reapplied;with bed alarm set;with family/visitor present   Nurse Communication Mobility status        Time: 5638-9373 OT Time Calculation (min): 45 min  Charges: OT General Charges $OT Visit: 1 Procedure OT Treatments $Self Care/Home Management : 8-22 mins  Hulda Humphrey OTR/L [place] 10/20/2016, 3:46 PM

## 2016-10-20 NOTE — Progress Notes (Signed)
STROKE TEAM PROGRESS NOTE   SUBJECTIVE (INTERVAL HISTORY)  His wife is at the bedside.  The patient is awake and alert.  The results of the CTA were discussed with the family, and they agreed to move forward with evaluating the patient for a right carotid endarterectomy.  Given his advanced dementia and age, Dr. Scot Dock does not feel the patient is an appropriate surgical candidate, and has discussed this with his wife.  OBJECTIVE Temp:  [98 F (36.7 C)-98.4 F (36.9 C)] 98 F (36.7 C) (07/04 0953) Pulse Rate:  [52-77] 77 (07/04 0953) Cardiac Rhythm: Normal sinus rhythm (07/04 0848) Resp:  [13-21] 19 (07/04 0953) BP: (100-129)/(36-60) 106/36 (07/04 0953) SpO2:  [96 %-100 %] 96 % (07/04 0953)  CBC:   Recent Labs Lab 10/17/16 1011 10/19/16 0221 10/20/16 0452  WBC 8.2 10.4 9.4  NEUTROABS 5.7  --   --   HGB 13.9 14.0 13.8  HCT 40.4 42.6 42.3  MCV 94.3 96.6 96.1  PLT 237 218 093    Basic Metabolic Panel:   Recent Labs Lab 10/19/16 0221 10/20/16 0452  NA 138 139  K 4.3 3.7  CL 106 105  CO2 24 27  GLUCOSE 83 92  BUN 11 12  CREATININE 1.03 1.01  CALCIUM 9.3 9.3    Lipid Panel:     Component Value Date/Time   CHOL 136 10/18/2016 0230   TRIG 63 10/18/2016 0230   HDL 57 10/18/2016 0230   CHOLHDL 2.4 10/18/2016 0230   VLDL 13 10/18/2016 0230   LDLCALC 66 10/18/2016 0230   HgbA1c:  Lab Results  Component Value Date   HGBA1C 5.7 (H) 10/18/2016   Urine Drug Screen: No results found for: LABOPIA, COCAINSCRNUR, LABBENZ, AMPHETMU, THCU, LABBARB  Alcohol Level No results found for: [place] I have personally reviewed the radiological images below and agree with the radiology interpretations.  Ct Angio Head W Or Wo Contrast 10/17/2016 IMPRESSION: No large vessel occlusion. Peripheral calcification throughout both carotid siphons with stenosis estimated at 30-50% bilaterally. Atherosclerotic calcification of the right vertebral artery at the foramen magnum with  stenosis estimated at 50-70% in that segment. 50% stenosis of the distal basilar artery. No branch vessel occlusion.   Ct Head Code Stroke W/o Cm 10/17/2016 IMPRESSION: 1. No acute finding by CT. Atrophy and chronic small vessel ischemic changes. Old right parietal cortical and subcortical infarction. Extensive sinus inflammatory disease on the right. Possible mucoceles. 2. ASPECTS is 10.   Mr Brain Wo Contrast 10/18/2016 IMPRESSION: 1. Scattered acute infarctions in the right MCA territory, most confluent about the upper central sulcus where there is petechial hemorrhage. 2. Background of moderate to advanced chronic small vessel ischemia. Cerebral atrophy that has progressed from 2011. 3. Chronic sinusitis from right middle meatus obstruction  CUS 10/19/2016 1. Findings are suggestive of 60-79% stenosis in the right internal carotid based on 2D/gray scale imaging and 1-39% stenosis in the left internal carotid artery.Both vertebral arteries show antegrade flow pattern.  TTE 10/18/2016 Left ventricle: The cavity size was normal. Wall thickness was normal. Systolic function was vigorous. The estimated ejection fraction was in the range of 65% to 70%.  Impressions:Poor acoustic windows. Study mainly done from subcostal view  CT Angio Neck 10/19/2016 1. 65-70% atheromatous narrowing of the proximal right ICA. 2. ~80% narrowing of the dominant right V1 segment. 3. No visible ulcerated plaque.    PHYSICAL EXAM  Temp:  [98 F (36.7 C)-98.4 F (36.9 C)] 98 F (36.7 C) (07/04  2353) Pulse Rate:  [52-77] 77 (07/04 0953) Resp:  [13-21] 19 (07/04 0953) BP: (100-129)/(36-60) 106/36 (07/04 0953) SpO2:  [96 %-100 %] 96 % (07/04 0953)  General - Well nourished, well developed, sleepy and lethargic.  Ophthalmologic - Fundi not visualized due to noncooperation.  Cardiovascular - irregular heartbeat with frequent premature beats, on telemetry showed frequent PACs and PVCs.  Neuro - awake and alert,  dysarthria, paucity of speech, but able to say "I don't know, do you know",  following most simple commands. Not able to name or repeat. PERRL, EOMI, left hemianopia vs. Left visual neglect, no blink to visual threat on the left. Left facial droop, tongue midline in mouth. Left upper extremity 0/5, right upper extremity spontaneous movement, and 4/5 at least. Right lower extremity at least 3/5, left lower extremity 3/5. Left Babinski positive, DTR 1+. Sensation, coordination not cooperative and gait not tested.   ASSESSMENT/PLAN Mr. Kelly Lloyd is a 83 y.o. male with history of atrial flutter (s/p ablation in 2013) and dementia presenting to [facility] ED 10/17/2016 with left arm weakness after a fall at home. He received tPA at 1030 on 10/17/2016.   Stroke - right MCA scattered infarcts, etiology likely right ICA stenosis vs. afib not on AC. However, hypercoagulable state due to malignancy or paradoxical emboli can not be completely ruled out at this time.   Resultant  Left arm paresis, baseline dementia  CT head: No acute finding by CT. Old right parietal cortical and subcortical infarction  MRI head: right MCA scattered infarcts  CTA head b/l siphon atherosclerosis, 50% BA stenosis, right VA athero with stenosis, left VA ends up at PICA  Carotid Doppler right ICA 60-79% stenosis  CTA neck: 1. 65-70% atheromatous narrowing of the proximal right ICA. 2. ~80% narrowing of the dominant right V1 segment.  2D Echo EF 65-70%  Family so far declined LE venous doppler and pan CT for further evaluation at this time.  LDL 66  HgbA1c 5.7  lovenox for VTE prophylaxis Diet regular Room service appropriate? Yes; Fluid consistency: Thin  No antithrombotic prior to admission, now on ASA 325mg . Due to hx of afib and high CHA2DS2-VASc score, recommend anticoagulation. Will start eliquis 5mg  bid at day 5-7 due to moderate size stroke, once eliquis started, ASA can be discontinued.   Patient and family  counseled to be compliant with his antithrombotic medications  Ongoing aggressive stroke risk factor management  Therapy recommendations:  CIR;Supervision/Assistance - 24 hour   Disposition:  Pending  Afib s/p RFCA not on AC  CHA2DS2-VASc score = 4      Supposed to be on the [facility] given the CHA2DS2-VASc score  Pt not following with cardiology now  Discussed anticoagulation with family , and will likely start patient on Eliquis 5mg  bid in 5-7 days. Meantime, ASA 325mg  for bridge.  Right ICA stenosis  CUS - right ICA 60-79% stenosis  CTA neck -  65-70% atheromatous narrowing of the proximal right ICA.  Vascular surgery consulted.  Patient is not a candidate for R CEA  Continue medical management  Dementia   Follows with [facility]  Had recent further cognitive decline  Baseline poor at home  On Aricept and Namenda  Sundowning at home - put on seroquel 25mg  QHs - will give daily at 7pm to help sundowning early evening   Weight loss  40 lbs in 6 months as per wife  Wife said pt eats well at home  Follows up with PCP lately  Family currently declined further work up for malignancy.  Hypertension  Stable  Long-term BP goal 120-150 due to carotid stenosis  Other Stroke Risk Factors  Advanced age  Hx stroke/TIA by imaging  Other Active Problems  None  Hospital day # 3   [name], MD PhD Stroke Neurology 10/20/2016 11:29 AM    To contact Stroke Continuity provider, please refer to [email or web address] After hours, contact General Neurology

## 2016-10-20 NOTE — Consult Note (Signed)
Patient name: Kelly Lloyd MRN: [id number] DOB: Dec 01, 1927 Sex: male   REASON FOR CONSULT:    Acute stroke. The consult is requested by Dr. Erlinda Hong.   HPI:   Kelly Lloyd is a pleasant 83 y.o. male, who was admitted on 10/17/2016 with an acute stroke. He has a history of Alzheimer's dementia and was found down at home. He was taken to [facility] were he was found to have isolated left arm weakness. After consultation with tele-neurology he underwent IV TPA administration.  The history is obtained from the wife as the patient has severe dementia. He is right handed. He has no previous history of stroke, TIAs, or amaurosis fugax. He developed dementia about 5 years ago which has been associated with some expressive aphasia. He developed the sudden onset of left arm paralysis on Sunday night and also left leg weakness. The left leg weakness has essentially resolved but he still has significant left arm weakness.  He has a history of atrial fibrillation in the past but apparently converted back to normal sinus rhythm and is no longer on anticoagulation.  His only real risk factor for peripheral vascular disease is a remote history of tobacco use. He has no history of diabetes, hypertension, hypercholesterolemia, or family history of premature cardiovascular disease.  According to his wife, he was not on aspirin prior to this admission.  Past Medical History:  Diagnosis Date  . Atrial flutter ([place])    aflutter s/p RFCA  . Benign prostatic hypertrophy   . BPH (benign prostatic hyperplasia)   . Dementia   . Diverticulosis of colon     Family History  Problem Relation Age of Onset  . Thyroid disease Unknown        family Hx  . Stroke Neg Hx        neg hx of CV  . Diabetes Neg Hx        DM; also neg hx of HBP  . Cancer Neg Hx        breast, ovarian, uterine  . Depression Neg Hx   . Alcohol abuse Neg Hx        no drug abuse     SOCIAL HISTORY: Social  History   Social History  . Marital status: Married    Spouse name: N/A  . Number of children: 3  . Years of education: N/A   Occupational History  . [employer] in [place] History Main Topics  . Smoking status: Former Smoker    Packs/day: 1.00    Years: 24.00    Quit date: 01/12/1974  . Smokeless tobacco: Never Used     Comment: quit in 1975  . Alcohol use No     Comment: Has a rare beer once a month at most.  . Drug use: No  . Sexual activity: Yes   Other Topics Concern  . Not on file   Social History Narrative   Married. 3 children. [employer]   retired    No Known Allergies  Current Facility-Administered Medications  Medication Dose Route Frequency Provider Last Rate Last Dose  . 0.9 %  sodium chloride infusion   Intravenous Continuous [name], MD 40 mL/hr at 10/19/16 1500    . acetaminophen (TYLENOL) tablet 650 mg  650 mg Oral Q4H PRN [name], MD   650 mg at 10/19/16 2107   Or  . acetaminophen (TYLENOL) solution 650 mg  650 mg Per Tube  Q4H PRN [name], MD       Or  . acetaminophen (TYLENOL) suppository 650 mg  650 mg Rectal Q4H PRN [name], MD      . aspirin tablet 325 mg  325 mg Oral Daily [name], MD   325 mg at 10/20/16 0800  . donepezil (ARICEPT) tablet 10 mg  10 mg Oral QHS [name], MD   10 mg at 10/20/16 0800  . dutasteride (AVODART) capsule 0.5 mg  0.5 mg Oral Daily [name], MD   0.5 mg at 10/19/16 2107  . enoxaparin (LOVENOX) injection 40 mg  40 mg Subcutaneous Q24H [name], MD   40 mg at 10/19/16 2108  . feeding supplement (ENSURE ENLIVE) (ENSURE ENLIVE) liquid 237 mL  237 mL Oral TID BM [name], MD   237 mL at 10/20/16 0805  . memantine (NAMENDA) tablet 10 mg  10 mg Oral BID [name], MD   10 mg at 10/20/16 0800  . pantoprazole (PROTONIX) EC tablet 40 mg  40 mg Oral Daily Harvel Quale, [place]   40 mg at 10/19/16 2107  . QUEtiapine  (SEROQUEL) tablet 25 mg  25 mg Oral QHS [name], MD   25 mg at 10/19/16 2107  . senna-docusate (Senokot-S) tablet 1 tablet  1 tablet Oral QHS PRN [name], MD        REVIEW OF SYSTEMS:  [X]  denotes positive finding, [ ]  denotes negative finding Cardiac  Comments:  Chest pain or chest pressure:    Shortness of breath upon exertion:    Short of breath when lying flat:    Irregular heart rhythm:        Vascular    Pain in calf, thigh, or hip brought on by ambulation:    Pain in feet at night that wakes you up from your sleep:     Blood clot in your veins:    Leg swelling:         Pulmonary    Oxygen at home:    Productive cough:     Wheezing:         Neurologic    Sudden weakness in arms or legs:  X Left arm and left leg.   Sudden numbness in arms or legs:     Sudden onset of difficulty speaking or slurred speech:    Temporary loss of vision in one eye:     Problems with dizziness:         Gastrointestinal    Blood in stool:     Vomited blood:         Genitourinary    Burning when urinating:     Blood in urine:        Psychiatric    Major depression:         Hematologic    Bleeding problems:    Problems with blood clotting too easily:        Skin    Rashes or ulcers:        Constitutional    Fever or chills:     PHYSICAL EXAM:   Vitals:   10/19/16 1400 10/19/16 1500 10/19/16 1556 10/20/16 0953  BP: (!) 128/56 (!) 118/56 (!) 125/51 (!) 106/36  Pulse: (!) 53 67 70 77  Resp: (!) 21 17 20 19   Temp:   98.4 F (36.9 C) 98 F (36.7 C)  TempSrc:   Oral Oral  SpO2: 98% 97% 100% 96%  Weight:  Height:        GENERAL: The patient is a well-nourished male, in no acute distress. The vital signs are documented above. CARDIAC: There is a regular rate and rhythm.  VASCULAR: I do not detect carotid bruits. He has palpable femoral, popliteal, and dorsalis pedis pulses bilaterally. PULMONARY: There is good air exchange bilaterally without wheezing or  rales. ABDOMEN: Soft and non-tender with normal pitched bowel sounds. I do not palpate an abdominal aortic aneurysm. MUSCULOSKELETAL: There are no major deformities or cyanosis. NEUROLOGIC: He has 1 out of 5 strength in the left upper extremity. SKIN: There are no ulcers or rashes noted. PSYCHIATRIC: The patient has a normal affect.  DATA:    CAROTID DUPLEX: I have reviewed his carotid duplex scanning was done on 10/18/2016. This showed evidence of a 60-79% proximal right internal carotid artery stenosis with no significant stenosis on the left.  CT ANGIOGRAM NECK: CT angiogram of the neck done on 10/19/2016 shows a 65-70% proximal right internal carotid artery stenosis. There is an 80% narrowing in a dominant right vertebral artery (V1 segment).  CT ANGIO HEAD: CT of the head done on 10/17/2016 shows calcification throughout both carotid siphons with moderate stenoses bilaterally of 30-50%. There is a 50% stenosis of the distal basilar artery and also stenosis in the right vertebral artery estimated at 50-70%.  2-D ECHO: 2-D echo shows an ejection fraction of 65-70%.  MEDICAL ISSUES:   SYMPTOMATIC RIGHT CAROTID STENOSIS: This patient has a 60-79% symptomatic right carotid stenosis. The stenosis is fairly smooth on the right and he does have some carotid siphon disease bilaterally. He has profound left upper extremity weakness. His left lower extremity weakness has improved significantly. Normally we would consider right carotid endarterectomy, however, I think this patient has significant dementia and given his age I do not think he is a good candidate for carotid endarterectomy at this point. I could not even get him to take deep breaths for me on exam so I think his dementia is fairly advanced. He was not on aspirin prior to this event, and this has been added. I had a long discussion with the wife about this plan and she seems agreeable with this conservative approach.  Deitra Mayo [facility] [place] [facility] [phone]

## 2016-10-21 ENCOUNTER — Encounter (HOSPITAL_COMMUNITY): Payer: Self-pay | Admitting: General Practice

## 2016-10-21 ENCOUNTER — Inpatient Hospital Stay (HOSPITAL_COMMUNITY)
Admission: RE | Admit: 2016-10-21 | Discharge: 2016-11-08 | DRG: 057 | Disposition: A | Discharge status: Skilled Nursing Facility | Payer: Medicare Other | Source: Intra-hospital | Attending: Physical Medicine & Rehabilitation | Admitting: Physical Medicine & Rehabilitation

## 2016-10-21 DIAGNOSIS — I6521 Occlusion and stenosis of right carotid artery: Secondary | ICD-10-CM | POA: Diagnosis present

## 2016-10-21 DIAGNOSIS — N400 Enlarged prostate without lower urinary tract symptoms: Secondary | ICD-10-CM | POA: Diagnosis present

## 2016-10-21 DIAGNOSIS — F0151 Vascular dementia with behavioral disturbance: Secondary | ICD-10-CM | POA: Diagnosis not present

## 2016-10-21 DIAGNOSIS — E7800 Pure hypercholesterolemia, unspecified: Secondary | ICD-10-CM | POA: Diagnosis present

## 2016-10-21 DIAGNOSIS — G8324 Monoplegia of upper limb affecting left nondominant side: Secondary | ICD-10-CM | POA: Diagnosis not present

## 2016-10-21 DIAGNOSIS — Z79899 Other long term (current) drug therapy: Secondary | ICD-10-CM

## 2016-10-21 DIAGNOSIS — G309 Alzheimer's disease, unspecified: Secondary | ICD-10-CM | POA: Diagnosis present

## 2016-10-21 DIAGNOSIS — F0280 Dementia in other diseases classified elsewhere without behavioral disturbance: Secondary | ICD-10-CM | POA: Diagnosis present

## 2016-10-21 DIAGNOSIS — Z9889 Other specified postprocedural states: Secondary | ICD-10-CM

## 2016-10-21 DIAGNOSIS — I63511 Cerebral infarction due to unspecified occlusion or stenosis of right middle cerebral artery: Secondary | ICD-10-CM | POA: Diagnosis present

## 2016-10-21 DIAGNOSIS — F05 Delirium due to known physiological condition: Secondary | ICD-10-CM | POA: Diagnosis present

## 2016-10-21 DIAGNOSIS — G8194 Hemiplegia, unspecified affecting left nondominant side: Secondary | ICD-10-CM | POA: Diagnosis not present

## 2016-10-21 DIAGNOSIS — G308 Other Alzheimer's disease: Secondary | ICD-10-CM | POA: Diagnosis not present

## 2016-10-21 DIAGNOSIS — I4892 Unspecified atrial flutter: Secondary | ICD-10-CM | POA: Diagnosis present

## 2016-10-21 DIAGNOSIS — Z87891 Personal history of nicotine dependence: Secondary | ICD-10-CM

## 2016-10-21 DIAGNOSIS — I69354 Hemiplegia and hemiparesis following cerebral infarction affecting left non-dominant side: Secondary | ICD-10-CM | POA: Diagnosis present

## 2016-10-21 DIAGNOSIS — F0281 Dementia in other diseases classified elsewhere with behavioral disturbance: Secondary | ICD-10-CM | POA: Diagnosis not present

## 2016-10-21 DIAGNOSIS — G301 Alzheimer's disease with late onset: Secondary | ICD-10-CM | POA: Diagnosis not present

## 2016-10-21 LAB — CBC
HCT: 40.3 % (ref 39.0–52.0)
HEMOGLOBIN: 12.9 g/dL — AB (ref 13.0–17.0)
MCH: 31 pg (ref 26.0–34.0)
MCHC: 32 g/dL (ref 30.0–36.0)
MCV: 96.9 fL (ref 78.0–100.0)
PLATELETS: 233 10*3/uL (ref 150–400)
RBC: 4.16 MIL/uL — AB (ref 4.22–5.81)
RDW: 12.8 % (ref 11.5–15.5)
WBC: 8.3 10*3/uL (ref 4.0–10.5)

## 2016-10-21 LAB — BASIC METABOLIC PANEL
Anion gap: 5 (ref 5–15)
BUN: 16 mg/dL (ref 6–20)
CALCIUM: 9.1 mg/dL (ref 8.9–10.3)
CHLORIDE: 105 mmol/L (ref 101–111)
CO2: 28 mmol/L (ref 22–32)
CREATININE: 1.21 mg/dL (ref 0.61–1.24)
GFR, EST NON AFRICAN AMERICAN: 52 mL/min — AB (ref >60)
Glucose, Bld: 92 mg/dL (ref 65–99)
Potassium: 3.8 mmol/L (ref 3.5–5.1)
SODIUM: 138 mmol/L (ref 135–145)

## 2016-10-21 MED ORDER — DONEPEZIL HCL 10 MG PO TABS: 10.0000 mg | ORAL_TABLET | Freq: Every day | ORAL | Status: DC

## 2016-10-21 MED ORDER — ONDANSETRON HCL 4 MG/2ML IJ SOLN: 4.0000 mg | Freq: Four times a day (QID) | INTRAMUSCULAR | Status: DC | PRN

## 2016-10-21 MED ORDER — ENSURE ENLIVE PO LIQD
237.0000 mL | Freq: Three times a day (TID) | ORAL | Status: DC
  Administered 2016-10-21 – 2016-11-08 (×47): 237 mL via ORAL

## 2016-10-21 MED ORDER — DONEPEZIL HCL 10 MG PO TABS
10.0000 mg | ORAL_TABLET | Freq: Every day | ORAL | Status: DC
  Administered 2016-10-22 – 2016-11-08 (×18): 10 mg via ORAL
  Filled 2016-10-21 (×19): qty 1

## 2016-10-21 MED ORDER — ENOXAPARIN SODIUM 40 MG/0.4ML ~~LOC~~ SOLN
40.0000 mg | SUBCUTANEOUS | Status: DC
  Administered 2016-10-21 – 2016-10-26 (×5): 40 mg via SUBCUTANEOUS
  Filled 2016-10-21 (×5): qty 0.4

## 2016-10-21 MED ORDER — SORBITOL 70 % SOLN: 30.0000 mL | Freq: Every day | Status: DC | PRN

## 2016-10-21 MED ORDER — ASPIRIN 325 MG PO TABS
325.0000 mg | ORAL_TABLET | Freq: Every day | ORAL | Status: DC
  Administered 2016-10-22 – 2016-10-27 (×6): 325 mg via ORAL
  Filled 2016-10-21 (×6): qty 1

## 2016-10-21 MED ORDER — ONDANSETRON HCL 4 MG PO TABS: 4.0000 mg | ORAL_TABLET | Freq: Four times a day (QID) | ORAL | Status: DC | PRN

## 2016-10-21 MED ORDER — ACETAMINOPHEN 160 MG/5ML PO SOLN: 650.0000 mg | ORAL | Status: DC | PRN

## 2016-10-21 MED ORDER — ACETAMINOPHEN 650 MG RE SUPP: 650.0000 mg | RECTAL | Status: DC | PRN

## 2016-10-21 MED ORDER — ENOXAPARIN SODIUM 40 MG/0.4ML ~~LOC~~ SOLN: 40.0000 mg | SUBCUTANEOUS | Status: DC

## 2016-10-21 MED ORDER — DUTASTERIDE 0.5 MG PO CAPS
0.5000 mg | ORAL_CAPSULE | Freq: Every day | ORAL | Status: DC
  Administered 2016-10-21 – 2016-11-07 (×16): 0.5 mg via ORAL
  Filled 2016-10-21 (×19): qty 1

## 2016-10-21 MED ORDER — QUETIAPINE FUMARATE 25 MG PO TABS
25.0000 mg | ORAL_TABLET | Freq: Every day | ORAL | Status: DC
  Filled 2016-10-21 (×2): qty 1

## 2016-10-21 MED ORDER — ACETAMINOPHEN 325 MG PO TABS
650.0000 mg | ORAL_TABLET | ORAL | Status: DC | PRN
  Administered 2016-11-01: 650 mg via ORAL
  Filled 2016-10-21: qty 2

## 2016-10-21 MED ORDER — MEMANTINE HCL 10 MG PO TABS
10.0000 mg | ORAL_TABLET | Freq: Two times a day (BID) | ORAL | Status: DC
  Administered 2016-10-21 – 2016-11-08 (×36): 10 mg via ORAL
  Filled 2016-10-21 (×38): qty 1

## 2016-10-21 NOTE — Care Management Note (Signed)
Case Management Note  Patient Details  Name: Kelly Lloyd MRN: [id number] Date of Birth: [birth year]  Subjective/Objective:                    Action/Plan: Patient d/cing to CIR today. No further needs per CM.  Expected Discharge Date:                  Expected Discharge Plan:  [place]  In-House Referral:     Discharge planning Services     Post Acute Care Choice:    Choice offered to:     DME Arranged:    DME Agency:     HH Arranged:    [place] Agency:     Status of Service:  Completed, signed off  If discussed at [employer] of Stay Meetings, dates discussed:    Additional Comments:  Pollie Friar, RN 10/21/2016, 2:47 PM

## 2016-10-21 NOTE — Progress Notes (Signed)
Kelly Arn, MD Physician Signed Physical Medicine and Rehabilitation  Consult Note Date of Service: 10/19/2016 9:07 AM  Related encounter: Admission (Current) from 10/17/2016 in [place] Collapse All   [] Hide copied text [] Hover for attribution information      Physical Medicine and Rehabilitation Consult Reason for Consult: Left side weakness Referring Physician: Dr. Erlinda Hong   HPI: Kelly Lloyd is a 83 y.o. right handed male with history of atrial flutter, dementia maintained on Namenda. Per chart review and family, patient lives with spouse independent prior to admission. At baseline he was able to feed himself and wash . 2 level home bath and bedroom upstairs with 2 steps to entry. Presented 10/17/2016 after being found down with left-sided weakness. Patient presented to [facility]. CT of the brain showed no acute intracranial process.  TPA was administered. Transferred to [facility] for further evaluation. MRI reviewed, showing multiple right infarcts >posterior. Per report, scattered acute infarcts in the right MCA territory most confluent about the upper central sulcus where there was petechial hemorrhage. CT angiogram of the head showed no large vessel occlusion. 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 No branch vessel occlusion. Echocardiogram with ejection fraction of 70%. Carotid Dopplers right ICA demonstrated a moderate amount of smooth homogenous plaque suggestive of 60-79% stenosis left ICA demonstrated 1-39% stenosis. Neurology consulted presently on aspirin for CVA prophylaxis. Subcutaneous Lovenox for DVT prophylaxis. Tolerating a regular diet. Physical occupational therapy evaluations completed with recommendations of physical medicine rehabilitation consult.   Review of Systems  Unable to perform ROS: Acuity of condition       Past Medical History:    Diagnosis Date  . Atrial flutter ([place])    aflutter s/p RFCA  . Benign prostatic hypertrophy   . BPH (benign prostatic hyperplasia)   . Dementia   . Diverticulosis of colon         Past Surgical History:  Procedure Laterality Date  . [place]  . audiol eval  06/07/02  . BACK SURGERY  2008  . COLONOSCOPY  03/08/06   2 polyps, divertics, int ext hemorrhoids  . Hemm excision  04/07/10   Dr. Bary Castilla  . HEMORRHOID SURGERY  03/08/06   [place]  . H[facility]P [facility]  10/2--22/10   aflutter ablation. TEE EF 55% 02/06/09  . [place]  . MRI INT  2/204   auit canals wnl except Mastoiditis  . TRANSURETHRAL RESECTION OF PROSTATE  2011        Family History  Problem Relation Age of Onset  . Thyroid disease Unknown        family Hx  . Stroke Neg Hx        neg hx of CV  . Diabetes Neg Hx        DM; also neg hx of HBP  . Cancer Neg Hx        breast, ovarian, uterine  . Depression Neg Hx   . Alcohol abuse Neg Hx        no drug abuse    Social History:  reports that he quit smoking about 42 years ago. He has a 24.00 pack-year smoking history. He has never used smokeless tobacco. He reports that he does not drink alcohol or use drugs. Allergies: No Known Allergies       Medications Prior to Admission  Medication Sig  Dispense Refill  . donepezil (ARICEPT) 10 MG tablet Take 10 mg by mouth daily.      Marland Kitchen dutasteride (AVODART) 0.5 MG capsule Take 0.5 mg by mouth daily.    . memantine (NAMENDA) 10 MG tablet Take 10 mg by mouth 2 (two) times daily.      Home: Home Living Family/patient expects to be discharged to:: Private residence Living Arrangements: Spouse/significant other Available Help at Discharge: Family Type of Home: House Home Access: Stairs to enter Technical brewer of Steps: 2 Home Layout: Two level, Bed/bath upstairs Alternate Level Stairs-Number of Steps: 12 Alternate Level Stairs-Rails: Can  reach both Bathroom Shower/Tub: Gaffer (built-in shower seat) Biochemist, clinical: Standard Home Equipment: Shower seat - built in  Functional History: Prior Function Level of Independence: Independent Comments: Has dementia, at baseline, but he is able to feed himself, wash himself, can go to gym with wife and walk on treadmill. Per wife has been pretty independent at home despite dementia Functional Status:  Mobility: Bed Mobility Overal bed mobility: Needs Assistance Bed Mobility: Supine to Sit, Sit to Supine Supine to sit: Max assist Sit to supine: Max assist, +2 for physical assistance General bed mobility comments: Patient required max assist to elevate trunk to upright and rotate to EOB. ABle to show some initiation of LE movement (right) to EOB but requires multimodal cues and increased time, increased physical assist to bring LLE to EOB and elevate/rotate.  Upon return to bed patient showing initiation of bilateral LEs but unable to clear LLE up into bed, required assist to return to supine and reposition. Transfers Overall transfer level: Needs assistance Equipment used:  (posey belt and wrap around support) Transfers: Sit to/from Stand Sit to Stand: +2 physical assistance, Max assist General transfer comment: +2 max assist to power up to standing, patient able to take on weight through bilateral LEs and push to power up. Increased effort to perform. Performed x3 during session. Patient able to maintain upright with one person physical assist and LE blocking. Ambulation/Gait General Gait Details: deferred at this time  ADL: ADL Overall ADL's : Needs assistance/impaired Eating/Feeding: Moderate assistance, Bed level Eating/Feeding Details (indicate cue type and reason): Family reports decreased accuracy to target when feeding himself.  Grooming: Maximal assistance, Sitting Upper Body Bathing: Maximal assistance, Sitting Lower Body Bathing: Total assistance, Sit to/from  stand Upper Body Dressing : Maximal assistance, Sitting Lower Body Dressing: Total assistance, Sit to/from Lobbyist Details (indicate cue type and reason): Able to complete sit<>stand in preparation for toilet transfers with max assist +2. Toileting- Clothing Manipulation and Hygiene: Total assistance, Sit to/from stand Functional mobility during ADLs: Moderate assistance, +2 for safety/equipment General ADL Comments: Pt able to follow commands during ADL participation with increased time.   Cognition: Cognition Overall Cognitive Status: Impaired/Different from baseline Orientation Level: Oriented to person, Disoriented to situation, Disoriented to place, Disoriented to time Cognition Arousal/Alertness: Awake/alert (lethargic post-activity) Behavior During Therapy: Restless Overall Cognitive Status: Impaired/Different from baseline Area of Impairment: Orientation, Attention, Memory, Following commands, Safety/judgement, Awareness, Problem solving Orientation Level: Disoriented to, Place, Time, Situation Current Attention Level: Focused Following Commands: Follows one step commands inconsistently, Follows one step commands with increased time Safety/Judgement: Decreased awareness of safety, Decreased awareness of deficits Awareness: Intellectual Problem Solving: Slow processing, Decreased initiation, Difficulty sequencing, Requires verbal cues, Requires tactile cues General Comments: Inattention to left side noted. Able to respond well to verbal and tactile cues to look and attend left, poor awareness of left extremities.  Blood pressure (!) 147/74, pulse 78, temperature 97.9 F (36.6 C), temperature source Axillary, resp. rate 17, height 6' (1.829 m), weight 80.7 kg (177 lb 14.6 oz), SpO2 97 %. Physical Exam  Vitals reviewed. Constitutional: He appears well-developed and well-nourished.  HENT:  Head: Normocephalic and atraumatic.  Eyes: EOM are normal. Right eye  exhibits no discharge. Left eye exhibits no discharge.  Neck: Normal range of motion. Neck supple. No thyromegaly present.  Cardiovascular: Normal rate and regular rhythm.   Respiratory: Effort normal and breath sounds normal. No respiratory distress.  GI: Soft. Bowel sounds are normal. He exhibits no distension.  Musculoskeletal: He exhibits no edema or tenderness.  Neurological: He is alert.  Makes eye contact with examiner.  Unable to follow commands (baseline) He did have some spontaneous speech.  Motor: not moving LUE/LLE  Dtrs symmetric Neg clonus, hoffman's  Skin: Skin is warm and dry.  Psychiatric:  Pleasantly demented    Lab Results Last 24 Hours       Results for orders placed or performed during the hospital encounter of 10/17/16 (from the past 24 hour(s))  CBC     Status: None   Collection Time: 10/19/16  2:21 AM  Result Value Ref Range   WBC 10.4 4.0 - 10.5 K/uL   RBC 4.41 4.22 - 5.81 MIL/uL   Hemoglobin 14.0 13.0 - 17.0 g/dL   HCT 42.6 39.0 - 52.0 %   MCV 96.6 78.0 - 100.0 fL   MCH 31.7 26.0 - 34.0 pg   MCHC 32.9 30.0 - 36.0 g/dL   RDW 12.8 11.5 - 15.5 %   Platelets 218 150 - 400 K/uL  Basic metabolic panel     Status: None   Collection Time: 10/19/16  2:21 AM  Result Value Ref Range   Sodium 138 135 - 145 mmol/L   Potassium 4.3 3.5 - 5.1 mmol/L   Chloride 106 101 - 111 mmol/L   CO2 24 22 - 32 mmol/L   Glucose, Bld 83 65 - 99 mg/dL   BUN 11 6 - 20 mg/dL   Creatinine, Ser 1.03 0.61 - 1.24 mg/dL   Calcium 9.3 8.9 - 10.3 mg/dL   GFR calc non Af Amer >60 >60 mL/min   GFR calc Af Amer >60 >60 mL/min   Anion gap 8 5 - 15      Imaging Results (Last 48 hours)  Ct Angio Head W Or Wo Contrast  Result Date: 10/17/2016 CLINICAL DATA:  Acute presentation with left arm weakness. Negative acute CT earlier today. EXAM: CT ANGIOGRAPHY HEAD TECHNIQUE: Multidetector CT imaging of the head was performed using the standard protocol during bolus  administration of intravenous contrast. Multiplanar CT image reconstructions and MIPs were obtained to evaluate the vascular anatomy. CONTRAST:  75 cc Isovue 370 COMPARISON:  Head CT earlier same day FINDINGS: CTA HEAD Anterior circulation: Both internal carotid arteries are patent through the siphon regions. There is peripheral siphon calcification with stenosis estimated at 30-50% bilaterally. Anterior and middle cerebral vessels are patent without proximal stenosis or detectable missing vessel. Posterior circulation: Both vertebral arteries are patent at the foramen magnum. There is extensive calcification of the right vertebral artery at the foramen magnum with stenosis estimated at 50-70% but patency beyond that. Small left vertebral terminates in PICA up. There is 50% stenosis of the distal basilar artery. Superior cerebellar and posterior cerebral vessels do show flow. Venous sinuses: Patent and normal Anatomic variants: None significant Delayed phase: No abnormal enhancement IMPRESSION: No  large vessel occlusion. Peripheral calcification throughout both carotid siphons with stenosis estimated at 30-50% bilaterally. Atherosclerotic calcification of the right vertebral artery at the foramen magnum with stenosis estimated at 50-70% in that segment. 50% stenosis of the distal basilar artery. No branch vessel occlusion. These results were called by telephone at the time of interpretation on 10/17/2016 at 12:24 pm to Dr. Lavonia Drafts , who verbally acknowledged these results. Electronically Signed   By: Nelson Chimes M.D.   On: 10/17/2016 12:26   Mr Brain Wo Contrast  Result Date: 10/18/2016 CLINICAL DATA:  Acute ischemic stroke. Left-sided weakness. Status post tPA. EXAM: MRI HEAD WITHOUT CONTRAST TECHNIQUE: Multiplanar, multiecho pulse sequences of the brain and surrounding structures were obtained without intravenous contrast. COMPARISON:  CTA of the head 10/17/2016.  Brain MRI 05/09/2009 FINDINGS: Brain:  Confluent cortically based infarction at the right frontal parietal junction with largest contiguous area of infarction measuring nearly 3 cm in length. There are smaller patchy cortical and white matter infarcts along the right cerebral convexity in the MCA distribution. Tiny acute infarct in the right caudate nucleus. Mild perirolandic petechial hemorrhage. Generalized cerebral volume loss with moderate medial temporal atrophy, progressed from 2011. Patient has history of dementia. Chronic small vessel ischemia with confluent gliosis in the cerebral white matter. Vascular: Major flow voids are preserved. Skull and upper cervical spine: Negative for marrow lesion Sinuses/Orbits: There is complete opacification of the right frontal, anterior ethmoid, and maxillary sinuses with internal inspissated secretions. IMPRESSION: 1. Scattered acute infarctions in the right MCA territory, most confluent about the upper central sulcus where there is petechial hemorrhage. 2. Background of moderate to advanced chronic small vessel ischemia. Cerebral atrophy that has progressed from 2011. 3. 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   On: 10/18/2016 13:09   Ct Head Code Stroke W/o Cm  Result Date: 10/17/2016 CLINICAL DATA:  Code stroke. Fell last night. Left arm weakness, worsening today. EXAM: CT HEAD WITHOUT CONTRAST TECHNIQUE: Contiguous axial images were obtained from the base of the skull through the vertex without intravenous contrast. COMPARISON:  Brain MRI 05/09/2009. FINDINGS: Brain: Chronic small-vessel ischemic changes affect the pons. No focal cerebellar finding. Cerebral hemispheres show generalized atrophy with chronic small-vessel ischemic changes of the thalami and white matter. Old right parietal cortical and subcortical infarction. No sign of acute infarction, mass lesion, hemorrhage, hydrocephalus or extra-axial collection. Vascular: There is  atherosclerotic calcification of the major vessels at the base of the brain. Skull: Negative Sinuses/Orbits: Opacification of the right maxillary, ethmoid and frontal sinuses consistent with chronic sinus inflammation. Mucocele not excluded. Orbits negative. Other: None significant ASPECTS ([place] Stroke Program Early CT Score) - Ganglionic level infarction (caudate, lentiform nuclei, internal capsule, insula, M1-M3 cortex): 7 - Supraganglionic infarction (M4-M6 cortex): 3 Total score (0-10 with 10 being normal): 10 IMPRESSION: 1. No acute finding by CT. Atrophy and chronic small vessel ischemic changes. Old right parietal cortical and subcortical infarction. Extensive sinus inflammatory disease on the right. Possible mucoceles. 2. ASPECTS is 10. These results were called by telephone at the time of interpretation on 10/17/2016 at 10:35 am to Dr. Lavonia Drafts , who verbally acknowledged these results. Electronically Signed   By: Nelson Chimes M.D.   On: 10/17/2016 10:37     Assessment/Plan: Diagnosis: Right MCA CVA Labs and images independently reviewed.  Records reviewed and summated above. Stroke: Continue secondary stroke prophylaxis and Risk Factor Modification listed below:   Antiplatelet  therapy:   Blood Pressure Management:  Continue current medication with prn's with permisive HTN per primary team Statin Agent:   Left sided hemiparesis: fit for orthosis to prevent contractures (resting hand splint for day, wrist cock up splint at night, PRAFO, etc) PT/OT for mobility, ADL training  Motor recovery: Fluoxetine  1. Does the need for close, 24 hr/day medical supervision in concert with the patient's rehab needs make it unreasonable for this patient to be served in a less intensive setting? Potentially  2. Co-Morbidities requiring supervision/potential complications: atrial flutter (monitor HR with increased activity), dementia (cont meds), BPH (monitor for retention) 3. Due to safety, disease  management and patient education, does the patient require 24 hr/day rehab nursing? Yes 4. Does the patient require coordinated care of a physician, rehab nurse, PT (1-2 hrs/day, 5 days/week) and OT (1-2 hrs/day, 5 days/week) to address physical and functional deficits in the context of the above medical diagnosis(es)? Yes Addressing deficits in the following areas: balance, endurance, locomotion, strength, transferring, bathing, dressing, toileting and psychosocial support 5. Can the patient actively participate in an intensive therapy program of at least 3 hrs of therapy per day at least 5 days per week? Yes 6. The potential for patient to make measurable gains while on inpatient rehab is excellent 7. Anticipated functional outcomes upon discharge from inpatient rehab are mod assist  with PT, mod assist with OT, n/a with SLP. 8. Estimated rehab length of stay to reach the above functional goals is: 25-30 days. 9. Anticipated D/C setting: Other 10. Anticipated post D/C treatments: HH therapy and Home excercise program 11. Overall Rehab/Functional Prognosis: good and fair  RECOMMENDATIONS: This patient's condition is appropriate for continued rehabilitative care in the following setting: Will consider CIR admission to decrease burden of care Patient has agreed to participate in recommended program. Potentially Note that insurance prior authorization may be required for reimbursement for recommended care.  Comment: Rehab Admissions Coordinator to follow up.  Delice Lesch, MD, Mellody Drown Cathlyn Parsons., PA-C 10/19/2016    Revision History                        Routing History

## 2016-10-21 NOTE — Progress Notes (Signed)
Physical Therapy Treatment Patient Details Name: Kelly Lloyd MRN: [id number] DOB: 05/03/1927 Today's Date: 10/21/2016    History of Present Illness Pt is an 83 y.o. male who was found down at home with L sided weakness and was taken to [facility] and was subsequently transferred to [facility]. He was administered tPA at 11:30 on 10/17/16. He has a PMH significant for atrial flutter (s/p ablation in 2013) and Alzheimer's dementia. MRI revealed R MCA territory scattered infarcts.     PT Comments    Pt seen for mobility progression and tolerated sit<>stand transfers with mod A x2. Pt demonstrates the ability to perform physical tasks but cognitively unable to comprehend and motor plan/coordinate. Therapist used multimodal cueing throughout session with inconsistent success. Pt continues to be an excellent candidate for Inpatient Rehab for further intensive therapies.   Follow Up Recommendations  CIR     Equipment Recommendations  None recommended by PT    Recommendations for Other Services       Precautions / Restrictions Precautions Precautions: Fall Restrictions Weight Bearing Restrictions: No    Mobility  Bed Mobility Overal bed mobility: Needs Assistance Bed Mobility: Supine to Sit;Sit to Supine;Rolling Rolling: Mod assist   Supine to sit: Mod assist;+2 for physical assistance Sit to supine: Max assist;+2 for physical assistance   General bed mobility comments: pt required increased time, multimodal cueing, assist to elevate trunk and use of bed pads to position pt's hips at EOB. Pt able to assist some with bilateral LEs. Pt also required max A x2 with bilateral LEs and trunk to return to supine and for positioning in bed  Transfers Overall transfer level: Needs assistance Equipment used: 2 person hand held assist Transfers: Sit to/from Stand Sit to Stand: Mod assist;+2 physical assistance         General transfer comment: increased time, multimodal cueing, mod A x2 to  rise into standing from elevated bed position.   Ambulation/Gait                 Stairs            Wheelchair Mobility    Modified Rankin (Stroke Patients Only) Modified Rankin (Stroke Patients Only) Pre-Morbid Rankin Score: No symptoms Modified Rankin: Severe disability     Balance Overall balance assessment: Needs assistance Sitting-balance support: No upper extremity supported;Feet supported Sitting balance-Leahy Scale: Fair Sitting balance - Comments: pt required constant min guard with occasional posterior leaning, able to correct with cueing this session Postural control: Posterior lean Standing balance support: Bilateral upper extremity supported;During functional activity Standing balance-Leahy Scale: Poor Standing balance comment: mod A x2 with posterior lean, unable to correct with multimodal cueing                            Cognition Arousal/Alertness: Awake/alert Behavior During Therapy: WFL for tasks assessed/performed Overall Cognitive Status: History of cognitive impairments - at baseline Area of Impairment: Attention;Memory;Safety/judgement;Following commands;Awareness;Problem solving                   Current Attention Level: Focused Memory: Decreased short-term memory Following Commands: Follows one step commands inconsistently;Follows one step commands with increased time Safety/Judgement: Decreased awareness of safety;Decreased awareness of deficits Awareness: Intellectual Problem Solving: Slow processing;Decreased initiation;Difficulty sequencing;Requires verbal cues;Requires tactile cues General Comments: pt with dementia at baseline. Required multimodal cueing throughout.      Exercises      General Comments  Pertinent Vitals/Pain Pain Assessment: Faces Faces Pain Scale: No hurt    Home Living                      Prior Function            PT Goals (current goals can now be found in the  care plan section) Acute Rehab PT Goals PT Goal Formulation: With family Time For Goal Achievement: 11/01/16 Potential to Achieve Goals: Good Progress towards PT goals: Progressing toward goals    Frequency    Min 4X/week      PT Plan Current plan remains appropriate    Co-evaluation              AM-PAC PT "6 Clicks" Daily Activity  Outcome Measure  Difficulty turning over in bed (including adjusting bedclothes, sheets and blankets)?: Total Difficulty moving from lying on back to sitting on the side of the bed? : Total Difficulty sitting down on and standing up from a chair with arms (e.g., wheelchair, bedside commode, etc,.)?: Total Help needed moving to and from a bed to chair (including a wheelchair)?: Total Help needed walking in hospital room?: Total Help needed climbing 3-5 steps with a railing? : Total 6 Click Score: 6    End of Session Equipment Utilized During Treatment: Gait belt Activity Tolerance: Patient tolerated treatment well Patient left: in bed;with call bell/phone within reach;with bed alarm set;with family/visitor present;with restraints reapplied Nurse Communication: Mobility status PT Visit Diagnosis: Difficulty in walking, not elsewhere classified (R26.2);Apraxia (R48.2);Other symptoms and signs involving the nervous system (R29.898);Hemiplegia and hemiparesis Hemiplegia - Right/Left: Left Hemiplegia - dominant/non-dominant: Non-dominant Hemiplegia - caused by: Cerebral infarction     Time: 5945-8592 PT Time Calculation (min) (ACUTE ONLY): 16 min  Charges:  $Therapeutic Activity: 8-22 mins                    G Codes:       [place], [place], DPT [place] 10/21/2016, 10:07 AM

## 2016-10-21 NOTE — Care Management Important Message (Signed)
Important Message  Patient Details  Name: Kelly Lloyd MRN: [id number] Date of Birth: 04/23/1927   Medicare Important Message Given:  Yes    Indie Nickerson Montine Circle 10/21/2016, 12:46 PM

## 2016-10-21 NOTE — Progress Notes (Signed)
Cristina Gong, RN Rehab Admission Coordinator Signed Physical Medicine and Rehabilitation  PMR Pre-admission Date of Service: 10/20/2016 2:12 PM  Related encounter: Admission (Current) from 10/17/2016 in [place]       [] Hide copied text PMR Admission Coordinator Pre-Admission Assessment  Patient: Kelly Lloyd is an 83 y.o., male MRN: [id number] DOB: [birth year] Height: 6' (182.9 cm) Weight: 80.7 kg (177 lb 14.6 oz)                                                                                                                                                  Insurance Information HMO:     PPO: yes     PCP:      IPA:      80/20:      OTHER: medicare advantage plan PRIMARY: Blue Medicare      Policy#: [id number]      Subscriber: pt CM Name: Pieter Partridge.       Phone#: [phone]     Fax#: [phone] Pre-Cert#: [id number]      Employer: retired Benefits:  Phone #: [phone]     Name: 10/21/16 Eff. Date: 04/19/16     Deduct: none      Out of Pocket Max: [phone]      Life Max: none CIR: $300 co pay per day days 1-6      SNF: no co pay days 1-20; $167.50 co pay per day days 21-60; no co pay days 61-100 Outpatient: $40 co pay per visit     Co-Pay: visits per medical neccesity Home Health: 100%      Co-Pay: visits per medical neccesity DME: 80%     Co-Pay: 20% Providers:  In network  SECONDARY:  none        Medicaid Application Date:       Case Manager:  Disability Application Date:       Case Worker:   Emergency Contact Information        Contact Information    Name Relation Home Work Mobile   Bogacz,[name] Spouse [phone]  [phone]     Current Medical History  Patient Admitting Diagnosis: right MCA CVA  History of Present Illness:  Kelly Lloyd a 83 y.o.right handed malewith history of atrial flutter, dementia maintained on Namenda.  Presented 10/17/2016 after being found down with left-sided weakness. Patient presented to  [facility]. CT of the brain showed no acute intracranial process. TPA was administered. Transferred to [facility] for further evaluation. MRI reviewed, showing multiple right infarcts >posterior. Per report, scattered acute infarcts in the right MCA territory most confluent about the upper central sulcus where there was petechial hemorrhage. CT angiogram of the head showed no large vessel occlusion. 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 No branch vessel occlusion. Echocardiogram  with ejection fraction of 70%. Carotid Dopplers right ICA demonstrated a moderate amount of smooth homogenous plaque suggestive of 60-79% stenosis left ICA demonstrated 1-39% stenosis. Neurology consulted presently on aspirin for CVA prophylaxis. Subcutaneous Lovenox for DVT prophylaxis. Tolerating a regular diet.   Total: 12 NIHSS  Past Medical History      Past Medical History:  Diagnosis Date  . Atrial flutter ([place])    aflutter s/p RFCA  . Benign prostatic hypertrophy   . BPH (benign prostatic hyperplasia)   . Dementia   . Diverticulosis of colon     Family History  family history is not on file.  Prior Rehab/Hospitalizations:  Has the patient had major surgery during 100 days prior to admission? No  Current Medications   Current Facility-Administered Medications:  .  0.9 %  sodium chloride infusion, , Intravenous, Continuous, [name], MD, Last Rate: 40 mL/hr at 10/21/16 0528 .  acetaminophen (TYLENOL) tablet 650 mg, 650 mg, Oral, Q4H PRN, 650 mg at 10/21/16 0052 **OR** acetaminophen (TYLENOL) solution 650 mg, 650 mg, Per Tube, Q4H PRN **OR** acetaminophen (TYLENOL) suppository 650 mg, 650 mg, Rectal, Q4H PRN, [name], [name], MD .  aspirin tablet 325 mg, 325 mg, Oral, Daily, [name], MD, 325 mg at 10/21/16 0959 .  donepezil (ARICEPT) tablet 10 mg, 10 mg, Oral, QHS, [name], [name], MD, 10 mg at 10/21/16 0959 .  dutasteride  (AVODART) capsule 0.5 mg, 0.5 mg, Oral, Daily, [name], [name], MD, 0.5 mg at 10/20/16 2124 .  enoxaparin (LOVENOX) injection 40 mg, 40 mg, Subcutaneous, Q24H, [name], MD, 40 mg at 10/20/16 2123 .  feeding supplement (ENSURE ENLIVE) (ENSURE ENLIVE) liquid 237 mL, 237 mL, Oral, TID BM, [name], MD, 237 mL at 10/21/16 0959 .  memantine (NAMENDA) tablet 10 mg, 10 mg, Oral, BID, [name], MD, 10 mg at 10/21/16 0959 .  pantoprazole (PROTONIX) EC tablet 40 mg, 40 mg, Oral, Daily, [name], [name], RPH, 40 mg at 10/20/16 2037 .  QUEtiapine (SEROQUEL) tablet 25 mg, 25 mg, Oral, QHS, [name], MD, 25 mg at 10/20/16 2037 .  senna-docusate (Senokot-S) tablet 1 tablet, 1 tablet, Oral, QHS PRN, [name], MD  Patients Current Diet: Diet regular Room service appropriate? Yes; Fluid consistency: Thin  Precautions / Restrictions Precautions Precautions: Fall Restrictions Weight Bearing Restrictions: No   Has the patient had 2 or more falls or a fall with injury in the past year?No  Prior Activity Level Community (5-7x/wk): pt and wife go to gym 3 times per week; go out to lunch daily  Development worker, international aid / [place] Devices/Equipment: None Home Equipment: Shower seat - built in  Prior Device Use: Indicate devices/aids used by the patient prior to current illness, exacerbation or injury? None of the above  Prior Functional Level Prior Function Level of Independence: Independent Comments: Has dementia, at baseline, but he is able to feed himself, wash himself, can go to gym with wife and walk on treadmill. Per wife has been pretty independent at home despite dementia  Self Care: Did the patient need help bathing, dressing, using the toilet or eating?  Independent. Wife states pt now does not like to shower; he will wash off only. Sometimes will not shave and will go to bed fully dressed at times.  Indoor Mobility: Did the patient need  assistance with walking from room to room (with or without device)? Independent  Stairs: Did the patient need assistance with internal or external stairs (with or without device)?  Independent  Functional Cognition: Did the patient need help planning regular tasks such as shopping or remembering to take medications? Needed some help wife hs had to take over administering his medications. 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 Wife states he can not manage money or math any longer.   Current Functional Level Cognition  Arousal/Alertness: Awake/alert Overall Cognitive Status: History of cognitive impairments - at baseline Current Attention Level: Focused Orientation Level: Oriented to person Following Commands: Follows one step commands inconsistently, Follows one step commands with increased time Safety/Judgement: Decreased awareness of safety, Decreased awareness of deficits General Comments: pt with dementia at baseline. Required multimodal cueing throughout. Attention: Sustained Sustained Attention: Impaired Sustained Attention Impairment: Verbal basic Memory: Impaired Memory Impairment: Storage deficit, Retrieval deficit Awareness: Impaired Awareness Impairment: Intellectual impairment Problem Solving: Impaired Problem Solving Impairment: Verbal complex, Functional complex, Verbal basic    Extremity Assessment (includes Sensation/Coordination)  Upper Extremity Assessment: LUE deficits/detail, Difficult to assess due to impaired cognition LUE Deficits / Details: Trace muscle activation in L grasp and at elbow. PROM WFL.   Lower Extremity Assessment: LLE deficits/detail, Difficult to assess due to impaired cognition LLE Deficits / Details: noted active movement LLE 2+/5 but unable to isolate in testing. Patient is able to take on weight in functional standing with some knee flexion noted LLE Coordination: decreased fine motor, decreased gross motor      ADLs  Overall ADL's : Needs assistance/impaired Eating/Feeding: Moderate assistance, Bed level Eating/Feeding Details (indicate cue type and reason): Family reports decreased accuracy to target when feeding himself.  Grooming: Wash/dry hands, Brushing hair, Maximal assistance, Sitting, Minimal assistance Grooming Details (indicate cue type and reason): Pt able to brush hair sitting EOB with set up and support for trunk to maintain sitting; Pt requires MAX hand over hand assit for any ADL that require BUE Upper Body Bathing: Maximal assistance, Sitting Lower Body Bathing: Total assistance, Sit to/from stand Upper Body Dressing : Maximal assistance, Sitting Lower Body Dressing: Total assistance, Sit to/from Lobbyist Details (indicate cue type and reason): Able to complete sit<>stand in preparation for toilet transfers with max assist +2. Toileting- Clothing Manipulation and Hygiene: Total assistance, Sit to/from stand Functional mobility during ADLs: Moderate assistance, +2 for safety/equipment General ADL Comments: Pt has a very joking personality, his daughter states that he needs about 3 word sentences to be able to follow commands    Mobility  Overal bed mobility: Needs Assistance Bed Mobility: Supine to Sit, Sit to Supine, Rolling Rolling: Mod assist Supine to sit: Mod assist, +2 for physical assistance Sit to supine: Max assist, +2 for physical assistance General bed mobility comments: pt required increased time, multimodal cueing, assist to elevate trunk and use of bed pads to position pt's hips at EOB. Pt able to assist some with bilateral LEs. Pt also required max A x2 with bilateral LEs and trunk to return to supine and for positioning in bed    Transfers  Overall transfer level: Needs assistance Equipment used: 2 person hand held assist Transfers: Sit to/from Stand Sit to Stand: Mod assist, +2 physical assistance General transfer comment: increased time,  multimodal cueing, mod A x2 to rise into standing from elevated bed position.     Ambulation / Gait / Stairs / Wheelchair Mobility  Ambulation/Gait General Gait Details: deferred at this time    Posture / Balance Dynamic Sitting Balance Sitting balance - Comments: pt required constant min guard with occasional posterior  leaning, able to correct with cueing this session Balance Overall balance assessment: Needs assistance Sitting-balance support: No upper extremity supported, Feet supported Sitting balance-Leahy Scale: Fair Sitting balance - Comments: pt required constant min guard with occasional posterior leaning, able to correct with cueing this session Postural control: Posterior lean Standing balance support: Bilateral upper extremity supported, During functional activity Standing balance-Leahy Scale: Poor Standing balance comment: mod A x2 with posterior lean, unable to correct with multimodal cueing    Special needs/care consideration BiPAP/CPAP  N/a CPM  N/a Continuous Drip IV  N/a Dialysis  N/a Life Vest  N/a Oxygen  N/a Special Bed  N/a Trach Size  N/a Wound Vac n/a Skin tear right hand; moisture associated skin areas to penis; ecchymosis bilateral arms Bowel mgmt: incontinent LBM 7/1 pta Bladder mgmt: condom catheter Diabetic mgmt n/a Waist belt for safety and bed and chair alarm Decreased safety awareness   Previous Home Environment Living Arrangements: Spouse/significant other  Lives With: Spouse Available Help at Discharge: Family, Available 24 hours/day (wife will have to speak with 3 stepchildren to assist) Type of Home: House Home Layout: Two level, Bed/bath upstairs, Able to live on main level with bedroom/bathroom Alternate Level Stairs-Rails: Can reach both Alternate Level Stairs-Number of Steps: 12 Home Access: Stairs to enter [employer] of Steps: 2 Bathroom Shower/Tub: Multimedia programmer: Standard Bathroom Accessibility:  Yes How Accessible: Accessible via walker Home Care Services: No  Discharge Living Setting Plans for Discharge Living Setting: Patient's home, Lives with (comment) (wife of 18 years) Type of Home at Discharge: House Discharge Home Layout: Two level, Bed/bath upstairs, Able to live on main level with bedroom/bathroom Alternate Level Stairs-Rails: Can reach both Alternate Level Stairs-Number of Steps: 12 Discharge Home Access: Stairs to enter Entrance Stairs-Rails: None Entrance Stairs-Number of Steps: 2 Discharge Bathroom Shower/Tub: Horticulturist, commercial: Standard Discharge Bathroom Accessibility: Yes How Accessible: Accessible via walker Does the patient have any problems obtaining your medications?: No  Social/Family/Support Systems Patient Roles: Spouse, Parent Contact Information: Risk analyst, spouse Anticipated Caregiver: wife and children Anticipated Ambulance person Information: see above Ability/Limitations of Caregiver: wife can provide superivsion to light physical assist Caregiver Availability: 24/7 (wife will have to speak to 3 stepchildren to assist with 24/) Discharge Plan Discussed with Primary Caregiver: Yes Is Caregiver In Agreement with Plan?: Yes Does Caregiver/Family have Issues with Lodging/Transportation while Pt is in Rehab?: No  Goals/Additional Needs Patient/Family Goal for Rehab: min assist PT, OT, and SLP Expected length of stay: ELOS 10- 14 days Pt/Family Agrees to Admission and willing to participate: Yes Program Orientation Provided & Reviewed with Pt/Caregiver Including Roles  & Responsibilities: Yes  Decrease burden of Care through IP rehab admission: to see how pt progresses with a set  consistent schedule  Possible need for SNF placement upon discharge: possible if pt does not reach supervision to min assist level that wife can manage. Wife made aware on 10/20/16 that insurance not guaranteed to pay both CIR as well as  SNF level rehabs.  Patient Condition: This patient's medical and functional status has changed since the consult dated: 10/19/2016 in which the Rehabilitation Physician determined and documented that the patient's condition is appropriate for intensive rehabilitative care in an inpatient rehabilitation facility. See "History of Present Illness" (above) for medical update. Functional changes are: mod to max assist. Patient's medical and functional status update has been discussed with the Rehabilitation physician and patient remains appropriate for inpatient rehabilitation. Will admit to inpatient rehab  today.  Preadmission Screen Completed By:  Cleatrice Burke, 10/21/2016 2:45 PM ______________________________________________________________________   Discussed status with Dr. Naaman Plummer on 10/21/2016 at  1445 and received telephone approval for admission today.  Admission Coordinator:  Cleatrice Burke, time 8628 Date 10/21/2016       Cosigned by: Meredith Staggers, MD at 10/21/2016 3:51 PM  Revision History

## 2016-10-21 NOTE — Progress Notes (Signed)
I have insurance approval and bed available to admit pt to inpt rehab today. Wife in agreement and I will make the arrangements to admit today. RN CM , SW and Dr. Erlinda Hong are aware. [phone]

## 2016-10-21 NOTE — Discharge Summary (Signed)
Stroke Discharge Summary  Patient ID: Kelly Lloyd       MRN: [id number]      DOB: 20-Jan-1928  Date of Admission: 10/17/2016 Date of Discharge: 10/21/2016  Attending Physician:  [name], MD, Stroke MD Consultant(s):  Treatment Team:  Angelia Mould, MD, Delice Lesch, MD (Physical Medicine & Rehabtilitation) Patient's PCP:  Dion Body, MD  Discharge Diagnoses: Principal Problem:   Acute ischemic stroke ([place]) - R MCA scattered infarcts likely due to right ICA high grade stenosis Active Problems:   Weight loss   Paroxysmal atrial fibrillation ([place])   Status post catheter ablation of atrial fibrillation   Alzheimer's disease   Atrial flutter ([place])   Flaccid hemiplegia of left 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 Atrial flutter ([place])    aflutter s/p RFCA  . Benign prostatic hypertrophy   . BPH (benign prostatic hyperplasia)   . Dementia   . Diverticulosis of colon    Past Surgical History:  Procedure Laterality Date  . [place]  . audiol eval  06/07/02  . BACK SURGERY  2008  . COLONOSCOPY  03/08/06   2 polyps, divertics, int ext hemorrhoids  . Hemm excision  04/07/10   Dr. Bary Castilla  . HEMORRHOID SURGERY  03/08/06   [place]  . HOSP [facility]  10/2--22/10   aflutter ablation. TEE EF 55% 02/06/09  . [place]  . MRI INT  2/204   auit canals wnl except Mastoiditis  . TRANSURETHRAL RESECTION OF PROSTATE  2011    Medications to be continued on Rehab . aspirin  325 mg Oral Daily  . donepezil  10 mg Oral QHS  . dutasteride  0.5 mg Oral Daily  . enoxaparin (LOVENOX) injection  40 mg Subcutaneous Q24H  . feeding supplement (ENSURE ENLIVE)  237 mL Oral TID BM  . memantine  10 mg Oral BID  . QUEtiapine  25 mg Oral QHS    LABORATORY STUDIES CBC    Component Value Date/Time   WBC 8.3 10/21/2016 0541   RBC 4.16 (L) 10/21/2016 0541   HGB 12.9 (L)  10/21/2016 0541   HGB 14.0 07/08/2011 1114   HCT 40.3 10/21/2016 0541   HCT 41.9 07/08/2011 1114   PLT 233 10/21/2016 0541   PLT 247 07/08/2011 1114   MCV 96.9 10/21/2016 0541   MCV 95 07/08/2011 1114   MCH 31.0 10/21/2016 0541   MCHC 32.0 10/21/2016 0541   RDW 12.8 10/21/2016 0541   RDW 13.2 07/08/2011 1114   LYMPHSABS 1.4 10/17/2016 1011   LYMPHSABS 1.3 07/08/2011 1114   MONOABS 0.7 10/17/2016 1011   MONOABS 0.9 (H) 07/08/2011 1114   EOSABS 0.4 10/17/2016 1011   EOSABS 0.3 07/08/2011 1114   BASOSABS 0.1 10/17/2016 1011   BASOSABS 0.0 07/08/2011 1114   CMP    Component Value Date/Time   NA 138 10/21/2016 0541   NA 139 07/08/2011 1114   K 3.8 10/21/2016 0541   K 4.9 07/08/2011 1114   CL 105 10/21/2016 0541   CL 102 07/08/2011 1114   CO2 28 10/21/2016 0541   CO2 31 07/08/2011 1114   GLUCOSE 92 10/21/2016 0541   GLUCOSE 80 07/08/2011 1114   BUN 16 10/21/2016 0541   BUN 16 07/08/2011 1114   CREATININE 1.21 10/21/2016 0541   CREATININE 1.20 07/08/2011 1114   CALCIUM 9.1 10/21/2016 0541   CALCIUM  9.6 07/08/2011 1114   PROT 7.0 10/17/2016 1011   ALBUMIN 3.9 10/17/2016 1011   AST 30 10/17/2016 1011   ALT 17 10/17/2016 1011   ALKPHOS 105 10/17/2016 1011   BILITOT 0.6 10/17/2016 1011   GFRNONAA 52 (L) 10/21/2016 0541   GFRNONAA >60 07/08/2011 1114   GFRAA >60 10/21/2016 0541   GFRAA >60 07/08/2011 1114   COAGS Lab Results  Component Value Date   INR 1.03 10/17/2016   INR 3.0 02/20/2009   INR 3.5 02/17/2009   Lipid Panel    Component Value Date/Time   CHOL 136 10/18/2016 0230   TRIG 63 10/18/2016 0230   HDL 57 10/18/2016 0230   CHOLHDL 2.4 10/18/2016 0230   VLDL 13 10/18/2016 0230   LDLCALC 66 10/18/2016 0230   HgbA1C  Lab Results  Component Value Date   HGBA1C 5.7 (H) 10/18/2016    SIGNIFICANT DIAGNOSTIC STUDIES I have personally reviewed the radiological images below and agree with the radiology interpretations.  Ct Angio Head W Or Wo  Contrast 10/17/2016 IMPRESSION: No large vessel occlusion. Peripheral calcification throughout both carotid siphons with stenosis estimated at 30-50% bilaterally. Atherosclerotic calcification of the right vertebral artery at the foramen magnum with stenosis estimated at 50-70% in that segment. 50% stenosis of the distal basilar artery. No branch vessel occlusion.   Ct Head Code Stroke W/o Cm 10/17/2016 IMPRESSION: 1. No acute finding by CT. Atrophy and chronic small vessel ischemic changes. Old right parietal cortical and subcortical infarction. Extensive sinus inflammatory disease on the right. Possible mucoceles. 2. ASPECTS is 10.   Mr Brain Wo Contrast 10/18/2016 IMPRESSION: 1. Scattered acute infarctions in the right MCA territory, most confluent about the upper central sulcus where there is petechial hemorrhage. 2. Background of moderate to advanced chronic small vessel ischemia. Cerebral atrophy that has progressed from 2011. 3. Chronic sinusitis from right middle meatus obstruction  CUS 10/19/2016 1. Findings are suggestive of 60-79% stenosis in the right internal carotid based on 2D/gray scale imaging and 1-39% stenosis in the left internal carotid artery.  Both vertebral arteries show antegrade flow pattern.  TTE 10/18/2016 Left ventricle: The cavity size was normal. Wall thickness was normal. Systolic function was vigorous. The estimated ejection fraction was in the range of 65% to 70%.  Impressions:Poor acoustic windows. Study mainly done from subcostal view  CT Angio Neck 10/19/2016 1. 65-70% atheromatous narrowing of the proximal right ICA. 2. ~80% narrowing of the dominant right V1 segment. 3. No visible ulcerated plaque.     HISTORY OF PRESENT ILLNESS Kelly Lloyd. Kelly Lloyd is an 83 year-old male with a history of atrial flutter (s/p ablation in 2013), and dementia presenting to [facility] ED 10/17/2016 with left arm weakness after a fall at home.  The patient lives with his wife, who found him on  the floor on the morning of 10/17/2016.  His wife noted worsening mental status in the day preceding the fall.  Per his son, his speech and word-finding are very limited at baseline, and appear unchanged on this admission.  CT head with no acute stroke and re-demonstration of old right parietal cortical and subcortical infarction.  CTA head with no large vessel occlusion, 30-50% lateral carotid stenosis, 50-70% atherosclerosis of the right vertebral artery at the foramen magnum, and 50% stenosis of the distal basilar artery.  Patient was administered tPA at 1130 on 10/17/2016.  He was admitted to the neuro ICU for further evaluation and treatment.   HOSPITAL COURSE Mr. Neamiah Sciarra Huss  is a 83 y.o. male with history of atrial flutter (s/p ablation in 2013) and dementia presenting to [facility] ED 10/17/2016 with left arm weakness after a fall at home. He received tPA at 1030 on 10/17/2016.  Stroke - right MCA scattered infarcts, etiology likely right ICA stenosis vs. afib not on AC. However, hypercoagulable state due to malignancy or paradoxical emboli can not be completely ruled out.   Resultant  Left arm paresis, baseline dementia  CT head: No acute finding by CT. Old right parietal cortical and subcortical infarction  MRI head: right MCA scattered infarcts  CTA head b/l siphon atherosclerosis, 50% BA stenosis, right VA athero with stenosis, left VA ends up at PICA  Carotid Doppler right ICA 60-79% stenosis  CTA neck: 1. 65-70% atheromatous narrowing of the proximal right ICA. 2. ~80% narrowing of the dominant right V1 segment.  2D Echo EF 65-70%  Family declined BLE [place] or pan CT.   LDL 66  HgbA1c 5.7  No antithrombotic prior to admission, now on ASA 325mg . Due to hx of afib and high CHA2DS2-VASc score, recommend anticoagulation. Will start eliquis 5mg  bid at day 5-7 due to moderate size stroke (7/6 - 7/8), once eliquis started, ASA can be discontinued.   Patient and family counseled to be  compliant with his antithrombotic medications  Ongoing aggressive stroke risk factor management  Therapy recommendations:  CIR;Supervision/Assistance - 24 hour  Disposition: CIR  Afib s/p RFCA not on AC  CHA2DS2-VASc score = 4                                           Supposed to be on the [facility] given the CHA2DS2-VASc score  Pt not following with cardiology now  Discussed anticoagulation with family , and will likely start patient on Eliquis 5mg  bid in 5-7 days (7/6-7/8). Meantime, ASA 325mg  for bridge.  Right ICA stenosis  CUS - right ICA 60-79% stenosis  CTA neck -  65-70% atheromatous narrowing of the proximal right ICA.  Vascular surgery consulted.  Patient is not a candidate for R CEA  Continue medical management  Dementia   Follows with [facility]  Had recent further cognitive decline  Baseline poor at home  On Aricept and Namenda  Sundowning at home - put on seroquel 25mg  QHs - will give daily at 7pm to help sundowning early evening   Weight loss  40 lbs in 6 months as per wife  Wife said pt eats well at home  Follows up with PCP lately  Family currently declined further work up for malignancy.  Hypertension  Stable  BP goal 120-150 due to carotid stenosis  Other Stroke Risk Factors  Advanced age  Hx stroke/TIA by imaging   Other Active Problems  None   DISCHARGE EXAM Blood pressure (!) 113/46, pulse 70, temperature 98.1 F (36.7 C), temperature source Oral, resp. rate 16, height 6' (1.829 m), weight 177 lb 14.6 oz (80.7 kg), SpO2 98 %. General - Well nourished, well developed, sleepy and lethargic.  Ophthalmologic - Fundi not visualized due to noncooperation.  Cardiovascular - irregular heartbeat with frequent premature beats.  Neuro - awake and alert, dysarthria, paucity of speech with limited content,  following most simple commands. Not able to name or repeat. PERRL, EOMI, left hemianopia vs. Left visual  neglect, no blink to visual threat on the left. Left facial droop, tongue  midline in mouth. Left upper extremity 0/5, right upper extremity spontaneous movement, and 4/5 at least. Right lower extremity at least 3/5, left lower extremity 3/5. Left Babinski positive, DTR 1+. Sensation, coordination not cooperative and gait not tested.   Discharge Diet  Diet regular Room service appropriate? Yes; Fluid consistency: Thin liquids  DISCHARGE PLAN  Disposition:  Transfer to [place] for ongoing PT, OT and ST  aspirin 325 mg daily for secondary stroke prevention.  Recommend ongoing risk factor control by Primary Care Physician at time of discharge from inpatient rehabilitation.  Follow-up Dion Body, MD in 2 weeks following discharge from rehab.  Follow-up with Cecille Rubin NP at [facility] in 6 weeks, office to schedule an appointment.   35 minutes were spent preparing discharge.  [name], MD PhD Stroke Neurology 10/21/2016 4:08 PM

## 2016-10-22 ENCOUNTER — Inpatient Hospital Stay (HOSPITAL_COMMUNITY): Payer: Medicare Other | Admitting: Physical Therapy

## 2016-10-22 ENCOUNTER — Inpatient Hospital Stay (HOSPITAL_COMMUNITY): Payer: Medicare Other | Admitting: Speech Pathology

## 2016-10-22 ENCOUNTER — Encounter (HOSPITAL_COMMUNITY): Payer: Self-pay

## 2016-10-22 ENCOUNTER — Inpatient Hospital Stay (HOSPITAL_COMMUNITY): Payer: Medicare Other

## 2016-10-22 DIAGNOSIS — I63511 Cerebral infarction due to unspecified occlusion or stenosis of right middle cerebral artery: Secondary | ICD-10-CM

## 2016-10-22 DIAGNOSIS — F05 Delirium due to known physiological condition: Secondary | ICD-10-CM

## 2016-10-22 LAB — CBC WITH DIFFERENTIAL/PLATELET
BASOS ABS: 0 10*3/uL (ref 0.0–0.1)
Basophils Relative: 0 %
Eosinophils Absolute: 0.3 10*3/uL (ref 0.0–0.7)
Eosinophils Relative: 3 %
HEMATOCRIT: 42.6 % (ref 39.0–52.0)
Hemoglobin: 13.8 g/dL (ref 13.0–17.0)
LYMPHS ABS: 1.4 10*3/uL (ref 0.7–4.0)
LYMPHS PCT: 14 %
MCH: 31.7 pg (ref 26.0–34.0)
MCHC: 32.4 g/dL (ref 30.0–36.0)
MCV: 97.7 fL (ref 78.0–100.0)
MONO ABS: 0.8 10*3/uL (ref 0.1–1.0)
Monocytes Relative: 9 %
NEUTROS ABS: 7.2 10*3/uL (ref 1.7–7.7)
Neutrophils Relative %: 74 %
Platelets: 188 10*3/uL (ref 150–400)
RBC: 4.36 MIL/uL (ref 4.22–5.81)
RDW: 12.8 % (ref 11.5–15.5)
WBC: 9.7 10*3/uL (ref 4.0–10.5)

## 2016-10-22 LAB — COMPREHENSIVE METABOLIC PANEL
ALT: 29 U/L (ref 17–63)
ANION GAP: 8 (ref 5–15)
AST: 52 U/L — ABNORMAL HIGH (ref 15–41)
Albumin: 3.3 g/dL — ABNORMAL LOW (ref 3.5–5.0)
Alkaline Phosphatase: 105 U/L (ref 38–126)
BUN: 16 mg/dL (ref 6–20)
CHLORIDE: 105 mmol/L (ref 101–111)
CO2: 24 mmol/L (ref 22–32)
CREATININE: 1.12 mg/dL (ref 0.61–1.24)
Calcium: 9.2 mg/dL (ref 8.9–10.3)
GFR, EST NON AFRICAN AMERICAN: 57 mL/min — AB (ref >60)
Glucose, Bld: 93 mg/dL (ref 65–99)
Potassium: 4.3 mmol/L (ref 3.5–5.1)
SODIUM: 137 mmol/L (ref 135–145)
Total Bilirubin: 0.9 mg/dL (ref 0.3–1.2)
Total Protein: 6.5 g/dL (ref 6.5–8.1)

## 2016-10-22 MED ORDER — RISPERIDONE 0.25 MG PO TABS
0.2500 mg | ORAL_TABLET | Freq: Every day | ORAL | Status: DC
  Administered 2016-10-22: 0.25 mg via ORAL
  Filled 2016-10-22: qty 1

## 2016-10-22 MED ORDER — RISPERIDONE 0.25 MG PO TABS
0.2500 mg | ORAL_TABLET | Freq: Every evening | ORAL | Status: DC | PRN
Start: 2016-10-22 — End: 2016-11-08
  Administered 2016-10-29 – 2016-11-07 (×3): 0.25 mg via ORAL
  Filled 2016-10-22 (×7): qty 1

## 2016-10-22 NOTE — Progress Notes (Signed)
Initial Nutrition Assessment  DOCUMENTATION CODES:   Severe malnutrition in context of chronic illness  INTERVENTION:  Continue Ensure Enlive po TID, each supplement provides 350 kcal and 20 grams of protein.  Recommend obtaining new weight to fully assess weight trends.   Encourage adequate PO intake.   NUTRITION DIAGNOSIS:   Malnutrition (severe) related to chronic illness (dementia) as evidenced by severe depletion of body fat, severe depletion of muscle mass.  GOAL:   Patient will meet greater than or equal to 90% of their needs  MONITOR:   PO intake, Supplement acceptance, Labs, Weight trends, Skin, I & O's  REASON FOR ASSESSMENT:   Malnutrition Screening Tool    ASSESSMENT:   83 y.o. right handed male with history of atrial flutter, dementia maintained on Namenda.  Presented 10/17/2016 after being found down with left-sided weakness. MRI reviewed, showing multiple right infarcts. scattered acute infarcts in the right MCA territory most confluent about the upper central sulcus where there was petechial hemorrhage.    Meal completion has been 75-90%. Pt reports having a good appetite currently and PTA with usual consumption of at least 3 meals a day. No recent weight recorded. Recommended obtaining new weight to fully assess weight trends. Pt currently has Ensure ordered and has been consuming them. RD to continue with current orders. Wife at bedside has been encouraging pt to eat at meals.  Nutrition-Focused physical exam completed. Findings are severe fat depletion, severe muscle depletion, and no edema.   Labs and medications reviewed.   Diet Order:  Diet regular Room service appropriate? Yes; Fluid consistency: Thin  Skin:  Reviewed, no issues  Last BM:  7/5  Height:   Ht Readings from Last 1 Encounters:  10/17/16 6' (1.829 m)    Weight:   Wt Readings from Last 1 Encounters:  10/17/16 177 lb 14.6 oz (80.7 kg)    Ideal Body Weight:  80.9 kg  BMI:   There is no height or weight on file to calculate BMI.  Estimated Nutritional Needs:   Kcal:  1700-1900  Protein:  90-100 grams  Fluid:  1.7 - 1.9 L/day  EDUCATION NEEDS:   No education needs identified at this time  Corrin Parker, MS, RD, LDN Pager # [phone] After hours/ weekend pager # [phone]

## 2016-10-22 NOTE — Progress Notes (Signed)
Physical Medicine and Rehabilitation Admission H&P    Chief complaint: Weakness  HPI: Kelly Lloyd a 83 y.o.right handed malewith history of atrial flutter with ablation 2013, dementia maintained on Namenda. Per chart review and family, patient lives with spouse independent prior to admission. At baseline he was able to feed himselfand wash . 2 level home bath and bedroom upstairs with 2steps to entry. Presented 10/17/2016 after being found down with left-sided weakness. Patient presented to [facility]. CT of the brain showed no acute intracranial process. TPA was administered. Transferred to [facility] for further evaluation. MRI reviewed, showing multiple right infarcts >posterior. Per report, scattered acute infarcts in the right MCA territory most confluent about the upper central sulcus where there was petechial hemorrhage. CT angiogram of the head showed no large vessel occlusion. 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 No branch vessel occlusion. Echocardiogram with ejection fraction of 70%. Carotid Dopplers right ICA demonstrated a moderate amount of smooth homogenous plaque suggestive of 60-79% stenosis left ICA demonstrated 1-39% stenosis. Neurology consulted presently on aspirin for CVA prophylaxis. Subcutaneous Lovenox for DVT prophylaxis. Vascular surgery consulted Dr. Deitra Mayo in regards to right carotid stenosis and did not feel patient was a candidate for carotid surgery at this time due to his advanced dementia. Tolerating a regular diet. Physical occupational therapy evaluations completed with recommendations of physical medicine rehabilitation consult. Patient was admitted for a comprehensive rehabilitation program  Review of Systems  Unable to perform ROS: Acuity of condition       Past Medical History:  Diagnosis Date  . Atrial flutter ([place])    aflutter s/p RFCA  . Benign prostatic  hypertrophy   . BPH (benign prostatic hyperplasia)   . Dementia   . Diverticulosis of colon         Past Surgical History:  Procedure Laterality Date  . [place]  . audiol eval  06/07/02  . BACK SURGERY  2008  . COLONOSCOPY  03/08/06   2 polyps, divertics, int ext hemorrhoids  . Hemm excision  04/07/10   Dr. Bary Castilla  . HEMORRHOID SURGERY  03/08/06   [place]  . HOSP [facility]  10/2--22/10   aflutter ablation. TEE EF 55% 02/06/09  . [facility]  . MRI INT  2/204   auit canals wnl except Mastoiditis  . TRANSURETHRAL RESECTION OF PROSTATE  2011   Family History  Problem Relation Age of Onset  . Thyroid disease Unknown        family Hx  . Stroke Neg Hx        neg hx of CV  . Diabetes Neg Hx        DM; also neg hx of HBP  . Cancer Neg Hx        breast, ovarian, uterine  . Depression Neg Hx   . Alcohol abuse Neg Hx        no drug abuse    Social History:  reports that he quit smoking about 42 years ago. He has a 24.00 pack-year smoking history. He has never used smokeless tobacco. He reports that he does not drink alcohol or use drugs. Allergies: No Known Allergies       Medications Prior to Admission  Medication Sig Dispense Refill  . donepezil (ARICEPT) 10 MG tablet Take 10 mg by mouth daily.      Marland Kitchen dutasteride (AVODART) 0.5 MG capsule Take 0.5 mg  by mouth daily.    . memantine (NAMENDA) 10 MG tablet Take 10 mg by mouth 2 (two) times daily.      Home: Home Living Family/patient expects to be discharged to:: Private residence Living Arrangements: Spouse/significant other Available Help at Discharge: Family Type of Home: House Home Access: Stairs to enter Technical brewer of Steps: 2 Home Layout: Two level, Bed/bath upstairs Alternate Level Stairs-Number of Steps: 12 Alternate Level Stairs-Rails: Can reach both Bathroom Shower/Tub: Gaffer (built-in shower seat) Biochemist, clinical:  Standard Home Equipment: Shower seat - built in   Functional History: Prior Function Level of Independence: Independent Comments: Has dementia, at baseline, but he is able to feed himself, wash himself, can go to gym with wife and walk on treadmill. Per wife has been pretty independent at home despite dementia  Functional Status:  Mobility: Bed Mobility Overal bed mobility: Needs Assistance Bed Mobility: Supine to Sit, Sit to Supine Supine to sit: Max assist Sit to supine: Max assist, +2 for physical assistance General bed mobility comments: Patient required max assist to elevate trunk to upright and rotate to EOB. ABle to show some initiation of LE movement (right) to EOB but requires multimodal cues and increased time, increased physical assist to bring LLE to EOB and elevate/rotate.  Upon return to bed patient showing initiation of bilateral LEs but unable to clear LLE up into bed, required assist to return to supine and reposition. Transfers Overall transfer level: Needs assistance Equipment used:  (posey belt and wrap around support) Transfers: Sit to/from Stand Sit to Stand: +2 physical assistance, Max assist General transfer comment: +2 max assist to power up to standing, patient able to take on weight through bilateral LEs and push to power up. Increased effort to perform. Performed x3 during session. Patient able to maintain upright with one person physical assist and LE blocking. Ambulation/Gait General Gait Details: deferred at this time  ADL: ADL Overall ADL's : Needs assistance/impaired Eating/Feeding: Moderate assistance, Bed level Eating/Feeding Details (indicate cue type and reason): Family reports decreased accuracy to target when feeding himself.  Grooming: Maximal assistance, Sitting Upper Body Bathing: Maximal assistance, Sitting Lower Body Bathing: Total assistance, Sit to/from stand Upper Body Dressing : Maximal assistance, Sitting Lower Body Dressing: Total  assistance, Sit to/from Lobbyist Details (indicate cue type and reason): Able to complete sit<>stand in preparation for toilet transfers with max assist +2. Toileting- Clothing Manipulation and Hygiene: Total assistance, Sit to/from stand Functional mobility during ADLs: Moderate assistance, +2 for safety/equipment General ADL Comments: Pt able to follow commands during ADL participation with increased time.   Cognition: Cognition Overall Cognitive Status: History of cognitive impairments - at baseline (per family) Arousal/Alertness: Awake/alert Orientation Level: Oriented to person Attention: Sustained Sustained Attention: Impaired Sustained Attention Impairment: Verbal basic Memory: Impaired Memory Impairment: Storage deficit, Retrieval deficit Awareness: Impaired Awareness Impairment: Intellectual impairment Problem Solving: Impaired Problem Solving Impairment: Verbal complex, Functional complex, Verbal basic Cognition Arousal/Alertness: Awake/alert (lethargic post-activity) Behavior During Therapy: Restless Overall Cognitive Status: History of cognitive impairments - at baseline (per family) Area of Impairment: Orientation, Attention, Memory, Following commands, Safety/judgement, Awareness, Problem solving Orientation Level: Disoriented to, Place, Time, Situation Current Attention Level: Focused Following Commands: Follows one step commands inconsistently, Follows one step commands with increased time Safety/Judgement: Decreased awareness of safety, Decreased awareness of deficits Awareness: Intellectual Problem Solving: Slow processing, Decreased initiation, Difficulty sequencing, Requires verbal cues, Requires tactile cues General Comments: Inattention to left side noted. Able to respond well to verbal  and tactile cues to look and attend left, poor awareness of left extremities.   Physical Exam: Blood pressure (!) 106/36, pulse 77, temperature 98 F (36.7 C),  temperature source Oral, resp. rate 19, height 6' (1.829 m), weight 80.7 kg (177 lb 14.6 oz), SpO2 96 %. Physical Exam  Vitals reviewed. HENT:  Head: Normocephalic.  Eyes: EOM are normal.  Neck: Normal range of motion. Neck supple. No thyromegaly present.  Cardiovascular: Normal rate.  Exam reveals no friction rub.   No murmur heard. Cardiac rate control  Respiratory: Effort normal and breath sounds normal. No respiratory distress. He has no wheezes. He has no rales.  GI: Soft. Bowel sounds are normal. He exhibits no distension.  Skin. Warm and dry Musculoskeletal: He exhibits no edemaor tenderness.  Neurological: He is alert. Remains pleasantly confused.   Makes eye contact with examiner.  He did have some spontaneous speech which is garbled. Follows commands inconsistently. Moves LLE to pain provocation. LUE 0/5. Left inattention  Motor: not moving LUE/LLE      Lab Results Last 48 Hours        Results for orders placed or performed during the hospital encounter of 10/17/16 (from the past 48 hour(s))  CBC     Status: None   Collection Time: 10/19/16  2:21 AM  Result Value Ref Range   WBC 10.4 4.0 - 10.5 K/uL   RBC 4.41 4.22 - 5.81 MIL/uL   Hemoglobin 14.0 13.0 - 17.0 g/dL   HCT 42.6 39.0 - 52.0 %   MCV 96.6 78.0 - 100.0 fL   MCH 31.7 26.0 - 34.0 pg   MCHC 32.9 30.0 - 36.0 g/dL   RDW 12.8 11.5 - 15.5 %   Platelets 218 150 - 400 K/uL  Basic metabolic panel     Status: None   Collection Time: 10/19/16  2:21 AM  Result Value Ref Range   Sodium 138 135 - 145 mmol/L   Potassium 4.3 3.5 - 5.1 mmol/L   Chloride 106 101 - 111 mmol/L   CO2 24 22 - 32 mmol/L   Glucose, Bld 83 65 - 99 mg/dL   BUN 11 6 - 20 mg/dL   Creatinine, Ser 1.03 0.61 - 1.24 mg/dL   Calcium 9.3 8.9 - 10.3 mg/dL   GFR calc non Af Amer >60 >60 mL/min   GFR calc Af Amer >60 >60 mL/min    Comment: (NOTE) The eGFR has been calculated using the CKD EPI equation. This calculation has  not been validated in all clinical situations. eGFR's persistently <60 mL/min signify possible Chronic Kidney Disease.    Anion gap 8 5 - 15  CBC     Status: None   Collection Time: 10/20/16  4:52 AM  Result Value Ref Range   WBC 9.4 4.0 - 10.5 K/uL   RBC 4.40 4.22 - 5.81 MIL/uL   Hemoglobin 13.8 13.0 - 17.0 g/dL   HCT 42.3 39.0 - 52.0 %   MCV 96.1 78.0 - 100.0 fL   MCH 31.4 26.0 - 34.0 pg   MCHC 32.6 30.0 - 36.0 g/dL   RDW 12.9 11.5 - 15.5 %   Platelets 215 150 - 400 K/uL  Basic metabolic panel     Status: None   Collection Time: 10/20/16  4:52 AM  Result Value Ref Range   Sodium 139 135 - 145 mmol/L   Potassium 3.7 3.5 - 5.1 mmol/L   Chloride 105 101 - 111 mmol/L   CO2 27 22 -  32 mmol/L   Glucose, Bld 92 65 - 99 mg/dL   BUN 12 6 - 20 mg/dL   Creatinine, Ser 1.01 0.61 - 1.24 mg/dL   Calcium 9.3 8.9 - 10.3 mg/dL   GFR calc non Af Amer >60 >60 mL/min   GFR calc Af Amer >60 >60 mL/min    Comment: (NOTE) The eGFR has been calculated using the CKD EPI equation. This calculation has not been validated in all clinical situations. eGFR's persistently <60 mL/min signify possible Chronic Kidney Disease.    Anion gap 7 5 - 15      Imaging Results (Last 48 hours)  Ct Angio Neck W Or Wo Contrast  Result Date: 10/19/2016 CLINICAL DATA:  Right MCA stroke EXAM: CT ANGIOGRAPHY NECK TECHNIQUE: Multidetector CT imaging of the neck was performed using the standard protocol during bolus administration of intravenous contrast. Multiplanar CT image reconstructions and MIPs were obtained to evaluate the vascular anatomy. Carotid stenosis measurements (when applicable) are obtained utilizing NASCET criteria, using the distal internal carotid diameter as the denominator. CONTRAST:  50 cc Isovue 370 intravenous COMPARISON:  Head CTA and brain MRI 10/17/2016 and 10/18/2016 respectively FINDINGS: Aortic arch: Atheromatous wall thickening and calcification of the arch. Three  vessel branching pattern. Right carotid system: Moderate predominately calcified plaque at the common carotid bifurcation. At the level of the ICA bulb is a noncalcified plaque leading to 60-70% stenosis. No superimposed ulceration noted. Negative for dissection. Left carotid system: Moderate mixed density plaque at the common carotid bifurcation and ICA bulb without stenosis or ulceration. Vertebral arteries: Proximal subclavian atherosclerosis without flow limiting stenosis. Strongly dominant right vertebral artery. Right V1 segment plaque narrows the lumen to approximately 1 mm, ~80% stenosis. Left vertebral artery is patent to the left pica. Skeleton: Diffuse cervical disc degeneration and ridging. No acute or aggressive finding. Other neck: Chronic complete opacification of visualized right ethmoid and maxillary sinuses. There is right middle meatus obstruction pattern based on previous MRI. Medialization of the left vocal folds which can be a sign of paresis. No associated underlying mass lesion. Upper chest: Subpleural reticulation on the right, likely scarring. Intracranial findings described on CTA from 2 days prior IMPRESSION: 1. 65-70% atheromatous narrowing of the proximal right ICA. 2. ~80% narrowing of the dominant right V1 segment. 3. No visible ulcerated plaque. Electronically Signed   By: Monte Fantasia M.D.   On: 10/19/2016 16:07        Medical Problem List and Plan: 1.  Left side weakness secondary to right MCA infarct             -begin inpatient rehab therapies 2.  DVT Prophylaxis/Anticoagulation: Subcutaneous Lovenox. Monitor platelet counts and any signs of bleeding 3. Pain Management: Tylenol 4. Mood: Namenda 10 mg twice a day, Aricept 10 mg daily at bedtime             -change seroquel to low dose risperdal to help with sundowning which has been severe 5. Neuropsych: This patient is capable of making decisions on his own behalf. 6. Skin/Wound Care: Routine skin checks 7.  Fluids/Electrolytes/Nutrition: Routine I&O with follow-up chemistries reviewed             -encourage PO 8. Right carotid stenosis. Follow-up vascular surgery. Not a surgical candidate at this time 9. Atrial flutter. Ablation 2013. Cardiac rate controlled             -hgb normal, labs reviewed 10. BPH. Avodart 0.5 mg daily. Check PVR 3  Post Admission Physician Evaluation: 1. Functional deficits secondary  to right mca infarct. 2. Patient is admitted to receive collaborative, interdisciplinary care between the physiatrist, rehab nursing staff, and therapy team. 3. Patient's level of medical complexity and substantial therapy needs in context of that medical necessity cannot be provided at a lesser intensity of care such as a SNF. 4. Patient has experienced substantial functional loss from his/her baseline which was documented above under the "Functional History" and "Functional Status" headings.  Judging by the patient's diagnosis, physical exam, and functional history, the patient has potential for functional progress which will result in measurable gains while on inpatient rehab.  These gains will be of substantial and practical use upon discharge  in facilitating mobility and self-care at the household level. 5. Physiatrist will provide 24 hour management of medical needs as well as oversight of the therapy plan/treatment and provide guidance as appropriate regarding the interaction of the two. 6. The Preadmission Screening has been reviewed and patient status is unchanged unless otherwise stated above. 7. 24 hour rehab nursing will assist with bladder management, bowel management, safety, skin/wound care, disease management, medication administration, pain management and patient education  and help integrate therapy concepts, techniques,education, etc. 8. PT will assess and treat for/with: Lower extremity strength, range of motion, stamina, balance, functional mobility, safety, adaptive  techniques and equipment, NMR, cognitive perceptual rx, family ed.   Goals are: min assist. 9. OT will assess and treat for/with: ADL's, functional mobility, safety, upper extremity strength, adaptive techniques and equipment, NMR, family education, ego support.   Goals are: min assist. Therapy may proceed with showering this patient. 10. SLP will assess and treat for/with: cognition, communication, swallowing.  Goals are: min assist. 11. Case Management and Social Worker will assess and treat for psychological issues and discharge planning. 12. Team conference will be held weekly to assess progress toward goals and to determine barriers to discharge. 13. Patient will receive at least 3 hours of therapy per day at least 5 days per week. 14. ELOS: 10-14 days       15. Prognosis:  excellent     Meredith Staggers, MD, [facility] 10/22/2016  Cathlyn Parsons., PA-C 10/20/2016

## 2016-10-22 NOTE — Care Management Note (Signed)
[facility] Individual Statement of Services  Patient Name:  Kelly Lloyd  Date:  [birth year]  Welcome to the [facility].  Our goal is to provide you with an individualized program based on your diagnosis and situation, designed to meet your specific needs.  With this comprehensive rehabilitation program, you will be expected to participate in at least 3 hours of rehabilitation therapies Monday-Friday, with modified therapy programming on the weekends.  Your rehabilitation program will include the following services:  Physical Therapy (PT), Occupational Therapy (OT), Speech Therapy (ST), 24 hour per day rehabilitation nursing, Therapeutic Recreaction (TR), Case Management (Social Worker), Rehabilitation Medicine, Nutrition Services and Pharmacy Services  Weekly team conferences will be held on Wednesday to discuss your progress.  Your Social Worker will talk with you frequently to get your input and to update you on team discussions.  Team conferences with you and your family in attendance may also be held.  Expected length of stay: 14-18 days Overall anticipated outcome: min assist level  Depending on your progress and recovery, your program may change. Your Social Worker will coordinate services and will keep you informed of any changes. Your Social Worker's name and contact numbers are listed  below.  The following services may also be recommended but are not provided by the [facility]:    [place] will be made to provide these services after discharge if needed.  Arrangements include referral to agencies that provide these services.  Your insurance has been verified to be:  [place] primary doctor is:  Dion Body  Pertinent information will be shared with your doctor and your insurance company.  Social Worker:  Ovidio Kin, [place] or [phone]  Information discussed with and copy given to patient by: [name], 10/22/2016, 3:49 PM

## 2016-10-22 NOTE — Plan of Care (Signed)
Problem: RH BLADDER ELIMINATION Goal: RH STG MANAGE BLADDER WITH ASSISTANCE STG Manage Bladder With Assistance  Outcome: Not Progressing Has condom catheter on  Problem: RH SKIN INTEGRITY Goal: RH STG SKIN FREE OF INFECTION/BREAKDOWN Outcome: Not Progressing multiple Bruises skin tear to right hand dry and opened to air  Problem: RH SAFETY Goal: RH STG ADHERE TO SAFETY PRECAUTIONS W/ASSISTANCE/DEVICE STG Adhere to Safety Precautions With Assistance/Device.  Outcome: Not Progressing Safety issues noted, patient trying to get out of bed, order received for wrist restraints

## 2016-10-22 NOTE — Progress Notes (Signed)
Social Work Assessment and Plan Social Work Assessment and Plan  Patient Details  Name: Kelly Lloyd MRN: [id number] Date of Birth: 1927/12/06  Today's Date: 10/22/2016  Problem List:  Patient Active Problem List   Diagnosis Date Noted  . Right middle cerebral artery stroke ([place]) 10/21/2016  . Atrial flutter ([place])   . 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 Weight loss 10/18/2016  . Paroxysmal atrial fibrillation ([place]) 10/18/2016  . Status post catheter ablation of atrial fibrillation 10/18/2016  . Alzheimer's disease 10/18/2016  . Acute ischemic stroke (HCC) - R MCA scattered infarcts, unknown etiology 10/17/2016  . Anal stricture 04/17/2014  . Rectal pain 04/17/2014  . Unspecified constipation 08/30/2012  . Medicare annual wellness visit, initial 01/19/2012  . Medically noncompliant 01/19/2012  . Post-nasal drip 01/19/2012  . Hemorrhoid 06/24/2011  . PENILE LESION 02/12/2010  . 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 HYPERCHOLESTEROLEMIA 02/10/2010  . MEMORY LOSS 02/05/2009  . DIVERTICULOSIS, COLON 12/15/2006  . BENIGN PROSTATIC HYPERTROPHY 12/15/2006  . OSTEOARTHRITIS, GENERALIZED, MULTIPLE JOINTS 12/15/2006   Past Medical History:  Past Medical History:  Diagnosis Date  . Alzheimer's dementia   . Atrial flutter ([place])    aflutter s/p RFCA  . Benign prostatic hypertrophy   . BPH (benign prostatic hyperplasia)   . Diverticulosis of colon   . Stroke ([place]) 10/17/2016   "can't use his left arm; LLE seems weaker" (10/21/2016)   Past Surgical History:  Past Surgical History:  Procedure Laterality Date  . [place]  . ATRIAL FLUTTER ABLATION  02/07/2009   aflutter ablation. TEE EF 55% 02/06/09  . audiol eval  06/07/02  . BACK SURGERY  2008   "disc surgery; not sure what part of back"  . COLONOSCOPY  03/08/06   2 polyps, divertics, int ext hemorrhoids  . HEMORRHOID SURGERY  03/08/06   [place]  .  [place]   at [facility]/ Hospital[facility] 09/01/2010  . HERNIA REPAIR  04/07/10   Dr. Bary Castilla  . [place]  . MRI INT  2/204   auit canals wnl except Mastoiditis  . TRANSURETHRAL RESECTION OF PROSTATE  2011   Social History:  reports that he quit smoking about 42 years ago. He has a 24.00 pack-year smoking history. He has never used smokeless tobacco. He reports that he does not drink alcohol or use drugs.  Family / Support Systems Marital Status: Married How Long?: 18 years Patient Roles: Spouse, Parent Spouse/Significant Other: Bethena Roys [phone]  [phone] Children: Dayle-daughter [phone]  Dean-son [phone] Other Supports: Friends Anticipated Caregiver: unsure at this time Ability/Limitations of Caregiver: Wife can only provide supervision level or light min, she is small and elderly Caregiver Availability: Other (Comment) (depends upon pt's progress here) Family Dynamics: This is pt's second marriage, he has three children form his first marriage who are involved and supportive. Wife will do what she can but is somewhat overwhelmed with him at this time. She defers to children.  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   Abuse/Neglect Physical Abuse: Denies Verbal Abuse: Denies Sexual Abuse: Denies  Exploitation of patient/patient's resources: Denies Self-Neglect: Denies  Emotional Status Pt's affect, behavior adn adjustment status: Pt has not been able to speak for sometime now due to his dementia.  Looked toward daughter and wife to answer the questions. He has been mobile and wife bathed him and helped him dress. He has been manageable at home prior to this, but at times has difficulty sleeping. Recent Psychosocial Issues: Alzhiemers will make recovery difficult Pyschiatric History: No history deferred depression screen due to pt unable to participate in interview with inability to speak. Will see if by non-verbal or facial expressions how he is doing. Substance Abuse History: No issues  Patient / Family Perceptions, Expectations & Goals Pt/Family understanding of illness & functional limitations: Wife and daughter have spoken with the MD and feel they have a good understanding of his stroke, unsure how he will do here. Aware he is limited due to his alzhiemers and ability to participate in therapies. Premorbid pt/family roles/activities: husband, father, grandfather, retiree, church member, etc Anticipated changes in roles/activities/participation: resume Pt/family expectations/goals: Wife states: " I hope he can make progress here and come home."  Daughter states: " We will just have to see how he does."  [employer]: None Premorbid Home Care/DME Agencies: None Transportation available at discharge: Wife Resource referrals recommended: Support group (specify)  Discharge Planning Living Arrangements: Spouse/significant other Support Systems: Spouse/significant other, Children, Water engineer, Social worker community Type of Residence: Private residence Administrator, sports: Multimedia programmer (specify) Psychologist, prison and probation services) Museum/gallery curator Resources: [place] Referred: No Living Expenses: Own Money Management: Spouse Does the patient have any problems obtaining your medications?: No Home Management: Wife Patient/Family Preliminary Plans: Unsure at this time depends upon his progress here. Wife would need him to be mobile, due to she can not  do any lifting. Have discussed hiring assistance and other alternative NHP. They plan to hire a sitter here due to pt's sundowning at night wheich he was combative last night even with son here with him. Social Work Anticipated Follow Up Needs: HH/OP, SNF, Support Group  Clinical Impression Pleasant daughter and wife, pt sitting in wheelchair picking at his pants the whole time and placing something imaginery in his pocket. Wife reports prior to this he was mobile and manageable at home. She is only able to do so Much for him physically. His children are supportive and involved, son has stayed with him a few nights but now plan to hire a sitter due to too much for him. Gave them agency sitter list. Will see how much recovery and progress He makes here and go from here regarding discharge disposition. With pt's alzheimers he will progressively get worse. Work with family on best plan for discharge.  [name] 10/22/2016, 3:44 PM

## 2016-10-22 NOTE — Progress Notes (Signed)
Patient is alert, awake, very confused and combative and has a soft waist restraint that is pulled tight on the patient, restraint removed, Kelly Lloyd. PA is paged, new order received for soft wrist restraints, restraints applied, son at bedside is educated, patient is monitored closely, no signs of acute distress noted.

## 2016-10-22 NOTE — Evaluation (Signed)
Occupational Therapy Assessment and Plan  Patient Details  Name: Kelly Lloyd MRN: [id number] Date of Birth: [birth year]  OT Diagnosis: abnormal posture, apraxia, cognitive deficits, disturbance of vision, hemiplegia affecting non-dominant side and muscle weakness (generalized) Rehab Potential: Rehab Potential (ACUTE ONLY): Fair ELOS: 14-18 days   Today's Date: 10/22/2016 OT Individual Time: 0900-1015 OT Individual Time Calculation (min): 75 min     Problem List:  Patient Active Problem List   Diagnosis Date Noted  . Right middle cerebral artery stroke ([place]) 10/21/2016  . Atrial flutter ([place])   . 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 Weight loss 10/18/2016  . Paroxysmal atrial fibrillation ([place]) 10/18/2016  . Status post catheter ablation of atrial fibrillation 10/18/2016  . Alzheimer's disease 10/18/2016  . Acute ischemic stroke (HCC) - R MCA scattered infarcts, unknown etiology 10/17/2016  . Anal stricture 04/17/2014  . Rectal pain 04/17/2014  . Unspecified constipation 08/30/2012  . Medicare annual wellness visit, initial 01/19/2012  . Medically noncompliant 01/19/2012  . Post-nasal drip 01/19/2012  . Hemorrhoid 06/24/2011  . PENILE LESION 02/12/2010  . 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 HYPERCHOLESTEROLEMIA 02/10/2010  . MEMORY LOSS 02/05/2009  . DIVERTICULOSIS, COLON 12/15/2006  . BENIGN PROSTATIC HYPERTROPHY 12/15/2006  . OSTEOARTHRITIS, GENERALIZED, MULTIPLE JOINTS 12/15/2006    Past Medical History:  Past Medical History:  Diagnosis Date  . Alzheimer's dementia   . Atrial flutter ([place])    aflutter s/p RFCA  . Benign prostatic hypertrophy   . BPH (benign prostatic hyperplasia)   . Diverticulosis of colon   . Stroke ([place]) 10/17/2016   "can't use his left arm; LLE seems weaker" (10/21/2016)   Past Surgical History:  Past Surgical History:  Procedure Laterality Date  . [place]  .  ATRIAL FLUTTER ABLATION  02/07/2009   aflutter ablation. TEE EF 55% 02/06/09  . audiol eval  06/07/02  . BACK SURGERY  2008   "disc surgery; not sure what part of back"  . COLONOSCOPY  03/08/06   2 polyps, divertics, int ext hemorrhoids  . HEMORRHOID SURGERY  03/08/06   [place]  . [place]   at [facility]/ Hospitalnotes 09/01/2010  . HERNIA REPAIR  04/07/10   Dr. Bary Castilla  . [place]  . MRI INT  2/204   auit canals wnl except Mastoiditis  . TRANSURETHRAL RESECTION OF PROSTATE  2011    Assessment & Plan Clinical Impression: Kelly Lloyd a 83 y.o.right handed malewith history of atrial flutter with ablation 2013, dementia maintained on [place]. Per chart review and family, patient lives with spouse independent prior to admission. At baseline he was able to feed himselfand wash . 2 level home bath and bedroom upstairs with 2steps to entry. Presented 10/17/2016 after being found down with left-sided weakness. [facility] to [facility]. CT of the brain showed no acute intracranial process. TPA was administered. Transferred to [facility] for further evaluation. MRI reviewed, showing multiple right infarcts >posterior. Per report, scattered acute infarcts in the right MCA territory most confluent about the upper central sulcus where there was petechial hemorrhage. CT angiogram of the head showed no large vessel occlusion. 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 No branch vessel occlusion. Echocardiogram with ejection fraction of 70%. Carotid Dopplers right ICA demonstrated a moderate amount of smooth homogenous plaque suggestive of 60-79% stenosis left ICA demonstrated  1-39% stenosis. Neurology consulted presently on aspirin for CVA prophylaxis. Subcutaneous Lovenox for DVT prophylaxis. Vascular surgery consulted Kelly Lloyd in regards to right carotid stenosis and  did not feel patient was a candidate for carotid surgery at this time due to his advanced dementia. Tolerating a regular diet. Physical occupational therapy evaluations completed with recommendations of physical medicine rehabilitation consult. Patient was admitted for a comprehensive rehabilitation program. Patient transferred to CIR on 10/21/2016 .    Patient currently requires max with basic self-care skills  secondary to muscle weakness, decreased cardiorespiratoy endurance, impaired timing and sequencing, motor apraxia, decreased coordination and decreased motor planning, field cut, decreased attention to left and decreased initiation, decreased attention, decreased awareness, decreased problem solving, decreased safety awareness, decreased memory and delayed processing.  Prior to hospitalization, patient could complete BADLs with supervision.  Patient will benefit from skilled intervention to decrease level of assist with basic self-care skills and increase independence with basic self-care skills prior to discharge home with care partner.  Anticipate patient will require 24 hour supervision and minimal physical assistance and follow up home health.  OT - End of Session Activity Tolerance: Tolerates 30+ min activity with multiple rests Endurance Deficit: Yes OT Assessment Rehab Potential (ACUTE ONLY): Fair Barriers to Discharge: Decreased caregiver support OT Patient demonstrates impairments in the following area(s): Balance;Cognition;Endurance;Motor;Pain;Safety;Perception;Vision OT Basic ADL's Functional Problem(s): Eating;Grooming;Bathing;Dressing;Toileting OT Transfers Functional Problem(s): Toilet OT Additional Impairment(s): Fuctional Use of Upper Extremity OT Plan OT Intensity: Minimum of 1-2 x/day, 45 to 90 minutes OT Frequency: 5 out of 7 days OT Duration/Estimated Length of Stay: 14-18 days OT Treatment/Interventions: Balance/vestibular training;Cognitive remediation/compensation OT  Self Feeding Anticipated Outcome(s): S OT Basic Self-Care Anticipated Outcome(s): MIN A OT Toileting Anticipated Outcome(s): MIN A OT Bathroom Transfers Anticipated Outcome(s): MIN A OT Recommendation Patient destination: Home Follow Up Recommendations: Home health OT Equipment Recommended: To be determined   Skilled Therapeutic Intervention 1:1. Wife and son present during beginning of session during education about role/purpose of OT, CIR, ELOS, and POC. Throughout session pt requires multimodal cueing to initiate actions and attend to tasks. Pt inconsistently follows 1 step commands requiring HOH A to compete tasks. Throughout session pt is confused and tries to complete tasks with LUE, however unable to 2/2 hemiplegia. Pt supine>sitting with MOD A for LLE and trunk facilitation. Pt sit to stand throughout session with MOD-MAX A with multimodal cueing for initiation. Pt declines bathing this session. At sink, pt washes face and brushes teeth with supervision and VC for scanning to L. Pt ambulates with 2 person HHA w/c<>toilet with MOD A for balance and weight shifting. Pt voids bowel and bladder with increased time and stands with MOD A for +2 to complete posterior hygiene. Pt unable to let go of grab bar with RUE unless LUE placed on other grab bar with HHA as pt advances brief past hips. To walk back to EOB, Pt unable to let go of grab bar and requires direct cue to initiate gait. OT threads BLE into pant legs after pt unable to problem solve how to don pants 2/2 apraxia. Pt sit to stand as stated above to advance pants past hips. Exited session with pt seated in w/c with call light in reach, all 4 rails up, wife present, and all needs met.   OT Evaluation Precautions/Restrictions  Precautions Precautions: Fall Restrictions Weight Bearing Restrictions: No General   Vital Signs  Pain Pain Assessment Pain Assessment: No/denies pain Home Living/Prior Functioning Home Living Available  Help  at Discharge: Family, Available 24 hours/day Type of Home: House Home Access: Stairs to enter [employer] of Steps: 2 Home Layout: Two level, Bed/bath upstairs, Able to live on main level with bedroom/bathroom Alternate Level Stairs-Number of Steps: 12 Alternate Level Stairs-Rails: Can reach both Bathroom Shower/Tub: Multimedia programmer: Programmer, systems: Yes  Lives With: Spouse IADL History Homemaking Responsibilities: No Current License: No ADL   Vision Vision Assessment?: Vision impaired- to be further tested in functional context Additional Comments: L inattention, decreased attention impacting formal visual testing Perception  Perception: Impaired (L inattention) Praxis Praxis: Impaired Praxis Impairment Details: Initiation;Motor planning Cognition Overall Cognitive Status: History of cognitive impairments - at baseline Arousal/Alertness: Awake/alert Orientation Level: Person Memory: Impaired Memory Impairment: Storage deficit;Retrieval deficit Immediate Memory Recall: Sock;Blue;Bed Attention: Sustained Sustained Attention: Impaired Sustained Attention Impairment: Verbal basic;Functional basic Sensation Sensation Proprioception: Impaired by gross assessment Additional Comments: Pt leaning posteriorly when sitting EOB Coordination Gross Motor Movements are Fluid and Coordinated: No Fine Motor Movements are Fluid and Coordinated: No Motor  Motor Motor: Hemiplegia Mobility  Transfers Transfers: Sit to Stand Sit to Stand: 3: Mod assist  Trunk/Postural Assessment  Cervical Assessment Cervical Assessment: Exceptions to [facility] (head forward) Thoracic Assessment Thoracic Assessment: Exceptions to [facility] (rounded shoulders) Lumbar Assessment Lumbar Assessment: Exceptions to [facility] (posterior pelvic tilt) Postural Control Postural Control: Deficits on evaluation (delayed)  Balance Balance Balance Assessed: Yes Dynamic Sitting  Balance Sitting balance - Comments: pt required constant min guard with occasional posterior leaning Dynamic Standing Balance Dynamic Standing - Balance Support: Left upper extremity supported Dynamic Standing - Level of Assistance: 3: Mod assist Dynamic Standing - Comments: dressing Extremity/Trunk Assessment RUE Assessment RUE Assessment: Exceptions to [facility] (generalized weakness) LUE Assessment LUE Assessment: Exceptions to [facility] (hemiplegia, min AROM in shoulder and trace activation of biceps/grasp)   See Function Navigator for Current Functional Status.   Refer to Care Plan for Long Term Goals  Recommendations for other services: None    Discharge Criteria: Patient will be discharged from OT if patient refuses treatment 3 consecutive times without medical reason, if treatment goals not met, if there is a change in medical status, if patient makes no progress towards goals or if patient is discharged from hospital.  The above assessment, treatment plan, treatment alternatives and goals were discussed and mutually agreed upon: by patient and by family  Tonny Branch 10/22/2016, 1:06 PM

## 2016-10-22 NOTE — Evaluation (Signed)
Speech Language Pathology Assessment and Plan  Patient Details  Name: Kelly Lloyd MRN: [id number] Date of Birth: [birth year]  SLP Diagnosis: N/A Rehab Potential: N/A ELOS: N/A   Today's Date: 10/22/2016 SLP Individual Time: 8264-1583 SLP Individual Time Calculation (min): 45 min   Problem List:  Patient Active Problem List   Diagnosis Date Noted  . Right middle cerebral artery stroke ([place]) 10/21/2016  . Atrial flutter ([place])   . 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 Weight loss 10/18/2016  . Paroxysmal atrial fibrillation ([place]) 10/18/2016  . Status post catheter ablation of atrial fibrillation 10/18/2016  . Alzheimer's disease 10/18/2016  . Acute ischemic stroke (HCC) - R MCA scattered infarcts, unknown etiology 10/17/2016  . Anal stricture 04/17/2014  . Rectal pain 04/17/2014  . Unspecified constipation 08/30/2012  . Medicare annual wellness visit, initial 01/19/2012  . Medically noncompliant 01/19/2012  . Post-nasal drip 01/19/2012  . Hemorrhoid 06/24/2011  . PENILE LESION 02/12/2010  . 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 HYPERCHOLESTEROLEMIA 02/10/2010  . MEMORY LOSS 02/05/2009  . DIVERTICULOSIS, COLON 12/15/2006  . BENIGN PROSTATIC HYPERTROPHY 12/15/2006  . OSTEOARTHRITIS, GENERALIZED, MULTIPLE JOINTS 12/15/2006   Past Medical History:  Past Medical History:  Diagnosis Date  . Alzheimer's dementia   . Atrial flutter ([place])    aflutter s/p RFCA  . Benign prostatic hypertrophy   . BPH (benign prostatic hyperplasia)   . Diverticulosis of colon   . Stroke ([place]) 10/17/2016   "can't use his left arm; LLE seems weaker" (10/21/2016)   Past Surgical History:  Past Surgical History:  Procedure Laterality Date  . [place]  . ATRIAL FLUTTER ABLATION  02/07/2009   aflutter ablation. TEE EF 55% 02/06/09  . audiol eval  06/07/02  . BACK SURGERY  2008   "disc surgery; not sure what part of back"   . COLONOSCOPY  03/08/06   2 polyps, divertics, int ext hemorrhoids  . HEMORRHOID SURGERY  03/08/06   [place]  . [place]   at the [facility] Administration/ Hospitalnotes 09/01/2010  . HERNIA REPAIR  04/07/10   Dr. Bary Castilla  . [place]  . MRI INT  2/204   auit canals wnl except Mastoiditis  . TRANSURETHRAL RESECTION OF PROSTATE  2011    Assessment / Plan / Recommendation Clinical Impression Patient is an 83 y.o.right handed malewith history of atrial flutterwith ablation 2013, dementia maintained on Namenda. Per chart review and family, patient lives with spouse independent prior to admission. At baseline he was able to feed himselfand wash . 2 level home bath and bedroom upstairs with 2steps to entry. Presented 10/17/2016 after being found down with left-sided weakness. Patient presented to [facility]. CT of the brain showed no acute intracranial process. TPA was administered. Transferred to [facility] for further evaluation. MRI reviewed, showing multiple right infarcts >posterior. Per report, scattered acute infarcts in the right MCA territory most confluent about the upper central sulcus where there was petechial hemorrhage. CT angiogram of the head showed no large vessel occlusion. 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 No branch vessel occlusion. Echocardiogram with ejection fraction of 70%. Carotid Dopplers right ICA demonstrated a moderate amount of smooth homogenous plaque suggestive of 60-79% stenosis left ICA demonstrated 1-39% stenosis. Neurology consulted presently on aspirin for CVA prophylaxis. Subcutaneous Lovenox for DVT prophylaxis.Vascular surgery consulted Dr. Deitra Mayo in regards  to right carotid stenosis and did not feel patient was a candidate for carotid surgery at this time due to his advanced dementia.  Patient transferred to CIR on 10/21/2016 .   Patient  demonstrates severe cognitive-linguistic impairments at baseline due to advanced dementia. Family present and report patient is at his cognitive baseline except for increased agitation. Educated family that suspect agitation is due to being in an unfamiliar environment and out of his daily routine. Also educated on strategies that staff will utilize to decrease agitation such as a set schedule and clinicians and utilizing familiar activities like using the commode vs. urinal. All verbalized understanding and agreement. Patient does not warrant further SLP intervention at this time.    Skilled Therapeutic Interventions          Administered a cognitive-linguistic evaluation. Please see above for details. Educated the patient's wife and daughter in regards to strategies the staff will utilize in attempt to minimize agitation. All verbalized understanding and agreement.    SLP Assessment  Patient does not need any further Speech Lanaguage Pathology Services    Recommendations  Oral Care Recommendations: Oral care BID Patient destination: Home Follow up Recommendations: 24 hour supervision/assistance;None Equipment Recommended: None recommended by SLP    SLP Frequency N/A  SLP Duration  SLP Intensity  SLP Treatment/Interventions N/A  N/A  N/A   Pain Pain Assessment Pain Assessment: No/denies pain Pain Score: 0-No pain  Prior Functioning Type of Home: House  Lives With: Spouse Available Help at Discharge: Family;Available 24 hours/day Vocation: Retired  Function:  Cognition Comprehension Comprehension assist level: Understands basic 25 - 49% of the time/ requires cueing 50 - 75% of the time  Expression   Expression assist level: Expresses basic 25 - 49% of the time/requires cueing 50 - 75% of the time. Uses single words/gestures.  Social Interaction Social Interaction assist level: Interacts appropriately 25 - 49% of time - Needs frequent redirection.  Problem Solving Problem  solving assist level: Solves basic 25 - 49% of the time - needs direction more than half the time to initiate, plan or complete simple activities  Memory Memory assist level: Recognizes or recalls 25 - 49% of the time/requires cueing 50 - 75% of the time   Short Term Goals: N/A   Refer to Care Plan for Long Term Goals  Recommendations for other services: None   Discharge Criteria: Patient will be discharged from SLP if patient refuses treatment 3 consecutive times without medical reason, if treatment goals not met, if there is a change in medical status, if patient makes no progress towards goals or if patient is discharged from hospital.  The above assessment, treatment plan, treatment alternatives and goals were discussed and mutually agreed upon: by family  Gustav Knueppel 10/22/2016, 2:52 PM

## 2016-10-22 NOTE — Progress Notes (Signed)
Patient information reviewed and entered into eRehab system by Azlin Zilberman, RN, CRRN, PPS Coordinator.  Information including medical coding and functional independence measure will be reviewed and updated through discharge.     Per nursing patient was given "Data Collection Information Summary for Patients in Inpatient Rehabilitation Facilities with attached "Privacy Act Statement-Health Care Records" upon admission.  

## 2016-10-22 NOTE — Evaluation (Signed)
Physical Therapy Assessment and Plan  Patient Details  Name: Kelly Lloyd MRN: [id number] Date of Birth: Jul 13, 1927 2 PT Diagnosis: Abnormality of gait, Coordination disorder, Hemiplegia non-dominant, Hypotonia, Impaired cognition and Muscle weakness Rehab Potential: Good ELOS: 14-18days    Today's Date: 10/22/2016 PT Individual Time: 1100-1200 PT Individual Time Calculation (min): 60 min    Problem List:  Patient Active Problem List   Diagnosis Date Noted  . Right middle cerebral artery stroke ([place]) 10/21/2016  . Atrial flutter ([place])   . 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 Weight loss 10/18/2016  . Paroxysmal atrial fibrillation ([place]) 10/18/2016  . Status post catheter ablation of atrial fibrillation 10/18/2016  . Alzheimer's disease 10/18/2016  . Acute ischemic stroke (HCC) - R MCA scattered infarcts, unknown etiology 10/17/2016  . Anal stricture 04/17/2014  . Rectal pain 04/17/2014  . Unspecified constipation 08/30/2012  . Medicare annual wellness visit, initial 01/19/2012  . Medically noncompliant 01/19/2012  . Post-nasal drip 01/19/2012  . Hemorrhoid 06/24/2011  . PENILE LESION 02/12/2010  . 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 HYPERCHOLESTEROLEMIA 02/10/2010  . MEMORY LOSS 02/05/2009  . DIVERTICULOSIS, COLON 12/15/2006  . BENIGN PROSTATIC HYPERTROPHY 12/15/2006  . OSTEOARTHRITIS, GENERALIZED, MULTIPLE JOINTS 12/15/2006    Past Medical History:  Past Medical History:  Diagnosis Date  . Alzheimer's dementia   . Atrial flutter ([place])    aflutter s/p RFCA  . Benign prostatic hypertrophy   . BPH (benign prostatic hyperplasia)   . Diverticulosis of colon   . Stroke ([place]) 10/17/2016   "can't use his left arm; LLE seems weaker" (10/21/2016)   Past Surgical History:  Past Surgical History:  Procedure Laterality Date  . [place]  . ATRIAL FLUTTER ABLATION  02/07/2009   aflutter ablation.  TEE EF 55% 02/06/09  . audiol eval  06/07/02  . BACK SURGERY  2008   "disc surgery; not sure what part of back"  . COLONOSCOPY  03/08/06   2 polyps, divertics, int ext hemorrhoids  . HEMORRHOID SURGERY  03/08/06   [place]  . [place]   at the [facility] Administration/ Hospitalnotes 09/01/2010  . HERNIA REPAIR  04/07/10   Dr. Bary Castilla  . [place]  . MRI INT  2/204   auit canals wnl except Mastoiditis  . TRANSURETHRAL RESECTION OF PROSTATE  2011    Assessment & Plan Clinical Impression: Patient is a 83 y.o.right handed malewith history of atrial flutter with ablation 2013, dementia maintained on Namenda. Per chart review and family, patient lives with spouse independent prior to admission. At baseline he was able to feed himselfand wash . 2 level home bath and bedroom upstairs with 2steps to entry. Presented 10/17/2016 after being found down with left-sided weakness. Patient presented to [facility]. CT of the brain showed no acute intracranial process. TPA was administered. Transferred to [facility] for further evaluation. MRI reviewed, showing multiple right infarcts >posterior. Per report, scattered acute infarcts in the right MCA territory most confluent about the upper central sulcus where there was petechial hemorrhage. CT angiogram of the head showed no large vessel occlusion. 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 No branch vessel occlusion. Echocardiogram with ejection fraction of 70%. Carotid Dopplers right ICA demonstrated a moderate amount of smooth homogenous plaque suggestive of 60-79% stenosis left ICA demonstrated 1-39% stenosis. Neurology consulted presently on aspirin for CVA  prophylaxis. Subcutaneous Lovenox for DVT prophylaxis. Vascular surgery consulted Dr. Deitra Mayo in regards to right carotid stenosis and did not feel patient was a candidate for carotid surgery at this  time due to his advanced dementia.  Patient transferred to CIR on 10/21/2016 .   Patient currently requires mod with mobility secondary to muscle weakness, impaired timing and sequencing and unbalanced muscle activation, decreased attention to left, decreased attention, decreased awareness, decreased problem solving, decreased safety awareness, decreased memory and delayed processing and decreased sitting balance, decreased standing balance, decreased postural control, hemiplegia and decreased balance strategies.  Prior to hospitalization, patient was supervision with mobility and lived with Spouse in a House home.  Home access is 2Stairs to enter.  Patient will benefit from skilled PT intervention to maximize safe functional mobility, minimize fall risk and decrease caregiver burden for planned discharge home with 24 hour assist.  Anticipate patient will benefit from follow up [facility] at discharge.  PT - End of Session Activity Tolerance: Tolerates < 10 min activity, no significant change in vital signs Endurance Deficit: Yes PT Assessment Rehab Potential (ACUTE/IP ONLY): Good Barriers to Discharge: [place] home environment;Other (comment) (Dementia) PT Patient demonstrates impairments in the following area(s): Balance;Behavior;Endurance;Motor;Nutrition;Pain;Perception;Safety;Sensory;Skin Integrity PT Transfers Functional Problem(s): Bed Mobility;Bed to Chair;Car;Floor;Furniture PT Locomotion Functional Problem(s): Ambulation;Wheelchair Mobility;Stairs PT Plan PT Intensity: Minimum of 1-2 x/day ,45 to 90 minutes PT Frequency: 5 out of 7 days PT Duration Estimated Length of Stay: 14-18days  PT Treatment/Interventions: Ambulation/gait training;Balance/vestibular training;Cognitive remediation/compensation;Community reintegration;Discharge planning;Disease management/prevention;DME/adaptive equipment instruction;Functional electrical stimulation;Functional mobility training;Neuromuscular  re-education;Pain management;Patient/family education;Psychosocial support;Skin care/wound management;Splinting/orthotics;Stair training;Therapeutic Activities;Therapeutic Exercise;UE/LE Coordination activities;Visual/perceptual remediation/compensation;UE/LE Strength taining/ROM;Wheelchair propulsion/positioning PT Transfers Anticipated Outcome(s): Min assist with LRAd  PT Locomotion Anticipated Outcome(s): Min assist with LRAD for household ambulation PT Recommendation Follow Up Recommendations: Home health PT Patient destination: Home Equipment Recommended: Wheelchair cushion (measurements);None recommended by PT;Rolling walker with 5" wheels  Skilled Therapeutic Intervention PT instructed patient in PT Evaluation and initiated treatment intervention; see below for results. PT educated patient in [place], rehab potential, rehab goals, and discharge recommendations. PT instructed pt in multiple transfers including  Various surface heights, and car transfer. Gait training with RW progressed from 70 ft to 164f with mod assist and use of LUE hand splint for RW control. [facility] and all needs met.      PT Evaluation Precautions/Restrictions Precautions Precautions: Fall Precaution Comments: Dementia Restrictions Weight Bearing Restrictions: No General   Vital Signs Pain Pain Assessment Pain Assessment: No/denies pain Pain Score: 0-No pain Home Living/Prior Functioning Home Living Available Help at Discharge: Family;Available 24 hours/day Type of Home: House Home Access: Stairs to enter ECenterPoint Energyof Steps: 2 Home Layout: Two level;Bed/bath upstairs;Able to live on main level with bedroom/bathroom Alternate Level Stairs-Number of Steps: 12 Alternate Level Stairs-Rails: Can reach both Bathroom Shower/Tub: WMultimedia programmer Standard Bathroom Accessibility: Yes  Lives With: Spouse Prior  Function Level of Independence: Needs assistance with homemaking;Independent with gait  Able to Take Stairs?: Yes Driving: No Vocation: Retired Comments: goes to gym. baseline dementia Vision/Perception  Vision - Assessment Eye Alignment: Within Functional Limits Additional Comments: L inattention, decreased attention impacting formal visual testing Perception Perception: Impaired Inattention/Neglect: Does not attend to left visual field;Does not attend to left side of body Praxis Praxis: Impaired Praxis Impairment Details: Ideomotor  Cognition Overall Cognitive Status: History of cognitive impairments - at baseline Arousal/Alertness: Awake/alert Orientation Level: Oriented to person  Attention: Sustained Sustained Attention: Impaired Sustained Attention Impairment: Verbal basic;Functional basic Memory: Impaired Memory Impairment: Storage deficit;Retrieval deficit Awareness: Impaired Problem Solving: Impaired Behaviors: Impulsive;Restless Safety/Judgment: Impaired Comments: base demendtia Sensation Sensation Light Touch: Impaired by gross assessment Proprioception: Impaired by gross assessment Additional Comments: poor trunk and LUE  Coordination Gross Motor Movements are Fluid and Coordinated: No Fine Motor Movements are Fluid and Coordinated: No Coordination and Movement Description: dysmetria in LLE. decreased tone in LUE Motor  Motor Motor: Hemiplegia Motor - Skilled Clinical Observations: L Hemipleia (UE >LE)  Mobility Bed Mobility Bed Mobility: Rolling Right;Rolling Left;Supine to Sit;Sit to Supine Rolling Right: 3: Mod assist Rolling Left: 3: Mod assist Supine to Sit: 2: Max assist Supine to Sit Details: Visual cues/gestures for sequencing;Verbal cues for sequencing;Verbal cues for precautions/safety;Verbal cues for technique;Manual facilitation for placement Sit to Supine: 3: Mod assist Sit to Supine - Details: Verbal cues for technique;Verbal cues for  precautions/safety;Verbal cues for safe use of DME/AE;Manual facilitation for weight shifting;Manual facilitation for placement Transfers Transfers: Yes Sit to Stand: 3: Mod assist Sit to Stand Details: Verbal cues for technique;Verbal cues for precautions/safety;Verbal cues for gait pattern;Verbal cues for safe use of DME/AE Stand Pivot Transfers: 3: Mod assist Stand Pivot Transfer Details: Verbal cues for technique;Verbal cues for precautions/safety;Verbal cues for gait pattern;Verbal cues for safe use of DME/AE Locomotion  Ambulation Ambulation: Yes Ambulation/Gait Assistance: 3: Mod assist Ambulation Distance (Feet): 70 Feet Assistive device: Rolling walker Ambulation/Gait Assistance Details: Verbal cues for technique;Verbal cues for precautions/safety;Verbal cues for safe use of DME/AE;Manual facilitation for weight shifting;Tactile cues for posture Gait Gait: Yes Gait Pattern: Impaired Gait Pattern: Narrow base of support;Ataxic Stairs / Additional Locomotion Stairs: Yes Stair Management Technique: One rail Right Number of Stairs: 8 Height of Stairs: 3 Wheelchair Mobility Wheelchair Mobility: No  Trunk/Postural Assessment  Cervical Assessment Cervical Assessment: Exceptions to [facility] (head forward) Thoracic Assessment Thoracic Assessment: Exceptions to [facility] (rounded shoulders) Lumbar Assessment Lumbar Assessment: Exceptions to [facility] (posterior pelvic tilt) Postural Control Postural Control: Deficits on evaluation (delayed)  Balance Balance Balance Assessed: Yes Dynamic Sitting Balance Sitting balance - Comments: pt required constant min guard with occasional posterior leaning Dynamic Standing Balance Dynamic Standing - Balance Support: Left upper extremity supported Dynamic Standing - Level of Assistance: 3: Mod assist Dynamic Standing - Comments: dressing Extremity Assessment  RUE Assessment RUE Assessment: Exceptions to [facility] (generalized weakness) LUE Assessment LUE  Assessment: Exceptions to [facility] (hemiplegia, min AROM in shoulder and trace activation of biceps/grasp)       See Function Navigator for Current Functional Status.   Refer to Care Plan for Long Term Goals  Recommendations for other services: None   Discharge Criteria: Patient will be discharged from PT if patient refuses treatment 3 consecutive times without medical reason, if treatment goals not met, if there is a change in medical status, if patient makes no progress towards goals or if patient is discharged from hospital.  The above assessment, treatment plan, treatment alternatives and goals were discussed and mutually agreed upon: by patient  Lorie Phenix 10/22/2016, 2:02 PM

## 2016-10-23 ENCOUNTER — Inpatient Hospital Stay (HOSPITAL_COMMUNITY): Payer: Medicare Other | Admitting: Occupational Therapy

## 2016-10-23 ENCOUNTER — Inpatient Hospital Stay (HOSPITAL_COMMUNITY): Payer: Medicare Other | Admitting: Physical Therapy

## 2016-10-23 DIAGNOSIS — F0151 Vascular dementia with behavioral disturbance: Secondary | ICD-10-CM

## 2016-10-23 MED ORDER — RISPERIDONE 1 MG PO TABS
0.5000 mg | ORAL_TABLET | Freq: Every day | ORAL | Status: DC
  Administered 2016-10-23 – 2016-11-07 (×16): 0.5 mg via ORAL
  Filled 2016-10-23 (×17): qty 1

## 2016-10-23 NOTE — Progress Notes (Signed)
Patient agitated at beginning of shift.  Refused all medications.  Refused po fluids.  Difficult to redirect.  Family at bedside and attempted to get patient to take yogurt that had medications mixed in.  Patient refused and swatted at family member.  Multiple attempts made to get out of bed unassisted.  Soft wrist restraints remained in place but patient was throwing legs off bed or scooting to end of bed.  Low bed obtained and patient moved closer to nurse's station for safety.  Patient with difficulty following directions/cueing.  When attempting to transfer, patient max assist x2 .  Grabbing at curtain in room, attempting to grab hands of caregivers, pulling back at staff.  Patient did take medications placed in chocolate ice cream, but spit out Avodart.  Rested quietly the rest of shift.    Fredna Dow M

## 2016-10-23 NOTE — Progress Notes (Signed)
Occupational Therapy Session Note  Patient Details  Name: Kelly Lloyd MRN: [id number] Date of Birth: 11/09/1927  Today's Date: 10/23/2016 OT Individual Time: 1331-1450 OT Individual Time Calculation (min): 79 min    Short Term Goals: Week 1:  OT Short Term Goal 1 (Week 1): Pt will sit to stand with min A in prep for clothing management OT Short Term Goal 2 (Week 1): Pt will locate 3/5 needed grooming items wiht min VC for scanning L on sink OT Short Term Goal 3 (Week 1): Pt will transfer to BSC/toilet with MOD A and LRAD OT Short Term Goal 4 (Week 1): Pt will don shirt with MOD A OT Short Term Goal 5 (Week 1): Pt will complete 2/3 components of LB dressing with touching A  Skilled Therapeutic Interventions/Progress Updates:    Tx focus on Lt NMR, dynamic standing, and endurance during functional and meaningful leisure-based activities.   Pt greeted in w/c with spouse and daughter present. Escorted him to therapy apartment. Played multiple Elvis Presley songs and pt stood to dance with spouse and daughter, bilateral UEs on their shoulders or holding their hands. Pt requiring Mod A for sit<stands with RW, Min A for dynamic balance. Pt swaying hips for Lt/Rt weight shifting, and elevating shoulders for bilateral integration. Pt visibly attempting to mouth song lyrics. Afterwards pt engaged in golfing activity while seated and standing as tolerated, initiating L UE use, but requiring HOH assist from therapist for maintaining functional grasp on putter. Pt initiating sit<stands (without device) to strike golf ball with putter. 1 sit<stand completed with supervision! Pt obtaining 2 holes in one, family cheering. After, sitting in w/c, pt holding music playing device with [facility] assist for integration of L UE. Discussed with family benefits of music listening with dementia population. Provided them with therapy ipad outside of tx for 1 hour, encouraged them to escort pt around facility for increasing  alertness and stimulation (ipad retrieved 1 hour later by this therapist). Pt left with family and safety belt applied.     For all commands, utilized heavy tactile and visual cues. Pt sensitive to context, responded well with repetitive practice.   Therapy Documentation Precautions:  Precautions Precautions: Fall Precaution Comments: Dementia Restrictions Weight Bearing Restrictions: No   Vital Signs: Therapy Vitals Temp: 98.2 F (36.8 C) Temp Source: Oral Pulse Rate: 79 Resp: 18 BP: (!) 97/54 Patient Position (if appropriate): Sitting Oxygen Therapy SpO2: 98 % O2 Device: Not Delivered Pain: No c/o pain during tx    ADL:      See Function Navigator for Current Functional Status.   Therapy/Group: Individual Therapy  Erma Raiche A Lynniah Janoski 10/23/2016, 3:54 PM

## 2016-10-23 NOTE — Progress Notes (Signed)
[facility] PHYSICAL MEDICINE & REHABILITATION     PROGRESS NOTE    Subjective/Complaints: Got 1-2 hours of sleep per RN. Restless early this morning. Moved closer to nurses station.   ROS: Limited due to cognitive/behavioral   Objective: Vital Signs: Blood pressure (!) 131/56, pulse 85, temperature 98 F (36.7 C), temperature source Oral, resp. rate 16, SpO2 98 %. No results found.  Recent Labs  10/21/16 0541 10/22/16 0741  WBC 8.3 9.7  HGB 12.9* 13.8  HCT 40.3 42.6  PLT 233 188    Recent Labs  10/21/16 0541 10/22/16 0741  NA 138 137  K 3.8 4.3  CL 105 105  GLUCOSE 92 93  BUN 16 16  CREATININE 1.21 1.12  CALCIUM 9.1 9.2   CBG (last 3)  No results for input(s): GLUCAP in the last 72 hours.  Wt Readings from Last 3 Encounters:  10/17/16 80.7 kg (177 lb 14.6 oz)  10/17/16 63.2 kg (139 lb 5.3 oz)  04/16/14 75.3 kg (166 lb)    Physical Exam:   Gen: restless.  HENT:  Head: Normocephalic.  Eyes: EOMare normal.  Neck: Normal range of motion. Neck supple. No thyromegalypresent.  Cardiovascular: RRR without murmur. No JVD  Respiratory: Effort normaland breath sounds normal. No respiratory distress. He has no wheezes. He has no rales.  GI: Soft. Bowel sounds are normal. He exhibits no distension.  Skin. Warm and dry Musculoskeletal: He exhibits no edemaor tenderness.  Neurological: He is alert.    Makes eye contact    Garbled speech.  Moves LLE to pain provocation. LUE 0/5. Left inattention Psych: confused, pleasant   Assessment/Plan: 1. Functional deficits  secondary to right MCA infarct which require 3+ hours per day of interdisciplinary therapy in a comprehensive inpatient rehab setting. Physiatrist is providing close team supervision and 24 hour management of active medical problems listed below. Physiatrist and rehab team continue to assess barriers to discharge/monitor patient progress toward functional and medical  goals.  Function:  Bathing Bathing position Bathing activity did not occur: Refused    Bathing parts      Bathing assist        Upper Body Dressing/Undressing Upper body dressing Upper body dressing/undressing activity did not occur: Refused                  Upper body assist        Lower Body Dressing/Undressing Lower body dressing   What is the patient wearing?: Pants, Underwear Underwear - Performed by patient: Thread/unthread right underwear leg, Thread/unthread left underwear leg   Pants- Performed by patient: Pull pants up/down Pants- Performed by helper: Thread/unthread left pants leg, Thread/unthread right pants leg                      Lower body assist        Toileting Toileting Toileting activity did not occur: No continent bowel/bladder event   Toileting steps completed by helper: Adjust clothing prior to toileting, Performs perineal hygiene, Adjust clothing after toileting Toileting Assistive Devices: Grab bar or rail  Toileting assist     Transfers Chair/bed transfer     Chair/bed transfer assist level: Moderate assist (Pt 50 - 74%/lift or lower) Chair/bed transfer assistive device: Armrests     Locomotion Ambulation     Max distance: 47ft  Assist level: Moderate assist (Pt 50 - 74%)   Wheelchair Wheelchair activity did not occur: Refused Type: Manual      Cognition Comprehension Comprehension  assist level: Understands basic less than 25% of the time/ requires cueing >75% of the time  Expression Expression assist level: Expresses basic 25 - 49% of the time/requires cueing 50 - 75% of the time. Uses single words/gestures.  Social Interaction Social Interaction assist level: Interacts appropriately 25 - 49% of time - Needs frequent redirection.  Problem Solving Problem solving assist level: Solves basic 25 - 49% of the time - needs direction more than half the time to initiate, plan or complete simple activities  Memory Memory  assist level: Recognizes or recalls 25 - 49% of the time/requires cueing 50 - 75% of the time   Medical Problem List and Plan: 1. Left side weaknesssecondary to right MCA infarct -begin inpatient rehab therapies 2. DVT Prophylaxis/Anticoagulation: Subcutaneous Lovenox. Monitor platelet counts and any signs of bleeding 3. Pain Management: Tylenol 4. Mood: Namenda 10 mg twice a day, Aricept 10 mg daily at bedtime -increase risperdal to 0.5mg  at 8pm for sundowning  -low bed  -restraints still required for patient safety 5. Neuropsych: This patient isnot capable of making decisions on hisown behalf. 6. Skin/Wound Care: Routine skin checks 7. Fluids/Electrolytes/Nutrition: Routine I&O with follow-up chemistries reviewed -encourage PO 8.Right carotid stenosis. Follow-up vascular surgery. Not a surgical candidate at this time 9.Atrial flutter. Ablation 2013. Cardiac rate controlled -hgb normal, labs reviewed 10. BPH. Avodart 0.5 mg daily. Check PVR 3   LOS (Days) 2 A FACE TO FACE EVALUATION WAS PERFORMED  Meredith Staggers, MD 10/23/2016 8:24 AM

## 2016-10-23 NOTE — Progress Notes (Signed)
Occupational Therapy Session Note  Patient Details  Name: Kelly Lloyd MRN: [id number] Date of Birth: 09/10/1927  Today's Date: 10/23/2016 OT Individual Time: 1478-2956 OT Individual Time Calculation (min): 60 min    Short Term Goals: Week 1:  OT Short Term Goal 1 (Week 1): Pt will sit to stand with min A in prep for clothing management OT Short Term Goal 2 (Week 1): Pt will locate 3/5 needed grooming items wiht min VC for scanning L on sink OT Short Term Goal 3 (Week 1): Pt will transfer to BSC/toilet with MOD A and LRAD OT Short Term Goal 4 (Week 1): Pt will don shirt with MOD A OT Short Term Goal 5 (Week 1): Pt will complete 2/3 components of LB dressing with touching A  Skilled Therapeutic Interventions/Progress Updates: Patient very confused, but appeared comfortable with his future dtr in law present - as he referred to her by name and seemed familiar with her. She stated she "used to be a cardiac nurse years ago."  The patient refused to bathe and stated he did not need to redress.   However, he was holding onto a packet that contained a toothbrus and paste and coaxed to the sink to stand to brush his teeth (static standing balance and being to sink to swish and spit out water was completed with contact guard assist).  He was receptive to stand to wash periarea and change his "depends" and pants.   He was able wash frontal periarea with mod assist for standing balance.   He was able to don depends, pants, socks and shoes with overall moderate assist.  He was able to squeeze this clinician's hand with 2/5 gross hand strength on 2 of 5 commands to do so.   Patient cognition was more inhibiting during these requests than his actual neuro muscular deficits.  Patient was left seated in this w/c with safety belt in place and with his future dtr  In law present.     Therapy Documentation Precautions:  Precautions Precautions: Fall Precaution Comments: Dementia Restrictions Weight  Bearing Restrictions: No   Pain: Pain Assessment Pain Assessment: No/denies pain    See Function Navigator for Current Functional Status.   Therapy/Group: Individual Therapy  Alfredia Ferguson [facility] 10/23/2016, 12:47 PM

## 2016-10-23 NOTE — Progress Notes (Signed)
Physical Therapy Session Note  Patient Details  Name: Kelly Lloyd MRN: [id number] Date of Birth: Oct 06, 1927  Today's Date: 10/23/2016 PT Individual Time: 1005-1100 PT Individual Time Calculation (min): 55 min   Short Term Goals: Week 1:  PT Short Term Goal 1 (Week 1): Pt will perform sit<>stand with min assist consistently PT Short Term Goal 2 (Week 1): Pt will initate WC mobility  PT Short Term Goal 3 (Week 1): Pt will ambulate 145f with mod assist and LRAD  PT Short Term Goal 4 (Week 1): Pt will perform bed mobiltiy with min assist from PT.   Skilled Therapeutic Interventions/Progress Updates:   [facility]  Ambulation in room to Bathroom  For toilet transfer. Mod assist for toilet transfer. Pt perform continent bowel and bladder evacuation. PT required to perform pericare clothing management. PT noted possible moisture related skin breakdown at sacrum. RN notified.   Gait training instructed by PT with RW [id number] min-mod assist from PT with max cues for proper Ad management and safety when returning to seat.   Nustep Reciprocal movement pattern and endurance training x 10 minutes with Constant assist for proper LUE position due to inattention and decreased motor control.   Patient returned too room and [facility] and all needs met. PT applied LUE lap tray for improved support of the L hand.           Therapy Documentation Precautions:  Precautions Precautions: Fall Precaution Comments: Dementia Restrictions Weight Bearing Restrictions: No Pain:   0/10   See Function Navigator for Current Functional Status.   Therapy/Group: Individual Therapy  AustLorie Phenix/2018, 11:07 AM

## 2016-10-24 LAB — URINALYSIS, COMPLETE (UACMP) WITH MICROSCOPIC
Bacteria, UA: NONE SEEN
Bilirubin Urine: NEGATIVE
GLUCOSE, UA: NEGATIVE mg/dL
HGB URINE DIPSTICK: NEGATIVE
Ketones, ur: NEGATIVE mg/dL
Leukocytes, UA: NEGATIVE
NITRITE: NEGATIVE
Protein, ur: NEGATIVE mg/dL
SPECIFIC GRAVITY, URINE: 1.017 (ref 1.005–1.030)
pH: 6 (ref 5.0–8.0)

## 2016-10-24 NOTE — IPOC Note (Signed)
Overall Plan of Care [facility]) Patient Details Name: Kelly Lloyd MRN: [id number] DOB: 01-22-1928  Admitting Diagnosis: R CVA  Hospital Problems: Active Problems:   Right middle cerebral artery stroke [facility])     Functional Problem List: Nursing Behavior, Bladder, Bowel, Safety, Motor, Skin Integrity  PT Balance, Behavior, Endurance, Motor, Nutrition, Pain, Perception, Safety, Sensory, Skin Integrity  OT Balance, Cognition, Endurance, Motor, Pain, Safety, Perception, Vision  SLP    TR         Basic ADL's: OT Eating, Grooming, Bathing, Dressing, Toileting     Advanced  ADL's: OT       Transfers: PT Bed Mobility, Bed to Chair, Car, Floor, Chief Operating Officer: PT Ambulation, Emergency planning/management officer, Stairs     Additional Impairments: OT Fuctional Use of Upper Extremity  SLP        TR      Anticipated Outcomes Item Anticipated Outcome  Self Feeding S  Swallowing      Basic self-care  MIN A  Toileting  MIN A   Bathroom Transfers MIN A  Bowel/Bladder  pt will be contintent bowel and bladder during admission  Transfers  Min assist with LRAd   Locomotion  Min assist with LRAD for household ambulation  Communication     Cognition     Pain  pt will be pain free or pain less than 3 during this admission  Safety/Judgment  pt will be free from falls during this admission   Therapy Plan: PT Intensity: Minimum of 1-2 x/day ,45 to 90 minutes PT Frequency: 5 out of 7 days PT Duration Estimated Length of Stay: 14-18days  OT Intensity: Minimum of 1-2 x/day, 45 to 90 minutes OT Frequency: 5 out of 7 days OT Duration/Estimated Length of Stay: 14-18 days         Team Interventions: Nursing Interventions Patient/Family Education, Bowel Management, Bladder Management, Medication Management, Cognitive Remediation/Compensation  PT interventions Ambulation/gait training, Balance/vestibular training, Cognitive remediation/compensation, Community  reintegration, Discharge planning, Disease management/prevention, DME/adaptive equipment instruction, Functional electrical stimulation, Functional mobility training, Neuromuscular re-education, Pain management, Patient/family education, Psychosocial support, Skin care/wound management, Splinting/orthotics, Stair training, Therapeutic Activities, Therapeutic Exercise, UE/LE Coordination activities, Visual/perceptual remediation/compensation, UE/LE Strength taining/ROM, Wheelchair propulsion/positioning  OT Interventions Training and development officer, Cognitive remediation/compensation  SLP Interventions    TR Interventions    SW/CM Interventions Discharge Planning, Psychosocial Support, Patient/Family Education    Team Discharge Planning: Destination: PT-Home ,OT- Home , SLP-Home Projected Follow-up: PT-Home health PT, OT-  Home health OT, SLP-24 hour supervision/assistance, None Projected Equipment Needs: PT-Wheelchair cushion (measurements), None recommended by PT, Rolling walker with 5" wheels, OT- To be determined, SLP-None recommended by SLP Equipment Details: PT- , OT-  Patient/family involved in discharge planning: PT- Patient, Family member/caregiver,  OT-Patient, Family member/caregiver, SLP-Family member/caregiver  MD ELOS: 14-18 days Medical Rehab Prognosis:  Excellent Assessment: The patient has been admitted for CIR therapies with the diagnosis of right MCA infarct, hx of dementia. The team will be addressing functional mobility, strength, stamina, balance, safety, adaptive techniques and equipment, self-care, bowel and bladder mgt, patient and caregiver education, cognition, communication, sleep-wake restoration, community reintegration. Goals have been set at min assist for locomotion, transfers, self-care, cognition, communication. Baseline dementia affecting carryover from day to day is a barrier to discharge.    Meredith Staggers, MD, FAAPMR      See Team Conference Notes for  weekly updates to the plan of care

## 2016-10-24 NOTE — Progress Notes (Signed)
[facility] PHYSICAL MEDICINE & REHABILITATION     PROGRESS NOTE    Subjective/Complaints: Slept much better last night. Resting when I entered room this morning.   ROS: pt denies nausea, vomiting, diarrhea, cough, shortness of breath or chest pain   Objective: Vital Signs: Blood pressure 130/75, pulse 80, temperature 99.1 F (37.3 C), temperature source Oral, resp. rate 16, SpO2 99 %. No results found.  Recent Labs  10/22/16 0741  WBC 9.7  HGB 13.8  HCT 42.6  PLT 188    Recent Labs  10/22/16 0741  NA 137  K 4.3  CL 105  GLUCOSE 93  BUN 16  CREATININE 1.12  CALCIUM 9.2   CBG (last 3)  No results for input(s): GLUCAP in the last 72 hours.  Wt Readings from Last 3 Encounters:  10/17/16 80.7 kg (177 lb 14.6 oz)  10/17/16 63.2 kg (139 lb 5.3 oz)  04/16/14 75.3 kg (166 lb)    Physical Exam:   Gen: restless.  HENT:  Head: Normocephalic.  Eyes: EOMare normal.  Neck: Normal range of motion. Neck supple. No thyromegalypresent.  Cardiovascular: RRR without murmur. No JVD   Respiratory: CTA Bilaterally without wheezes or rales. Normal effort  GI: Soft. Bowel sounds are normal. He exhibits no distension.  Skin. Warm and dry Musculoskeletal: He exhibits no edemaor tenderness.  Neurological: He is just awakening. calm    Makes eye contact      Moves LLE to pain provocation. LUE 0/5. Left inattention Psych: confused, pleasant   Assessment/Plan: 1. Functional deficits  secondary to right MCA infarct which require 3+ hours per day of interdisciplinary therapy in a comprehensive inpatient rehab setting. Physiatrist is providing close team supervision and 24 hour management of active medical problems listed below. Physiatrist and rehab team continue to assess barriers to discharge/monitor patient progress toward functional and medical goals.  Function:  Bathing Bathing position Bathing activity did not occur: Refused Position: Standing at sink  Bathing parts  Body parts bathed by patient: Front perineal area (He refused to wash any parts other then perineal/buttocks) Body parts bathed by helper: Buttocks  Bathing assist Assist Level: Touching or steadying assistance(Pt > 75%)      Upper Body Dressing/Undressing Upper body dressing Upper body dressing/undressing activity did not occur: Refused What is the patient wearing?: Pull over shirt/dress     Pull over shirt/dress - Perfomed by patient: Pull shirt over trunk, Put head through opening, Thread/unthread right sleeve Pull over shirt/dress - Perfomed by helper: Thread/unthread left sleeve        Upper body assist Assist Level: Touching or steadying assistance(Pt > 75%)      Lower Body Dressing/Undressing Lower body dressing   What is the patient wearing?: Pants, Underwear Underwear - Performed by patient: Thread/unthread right underwear leg Underwear - Performed by helper: Thread/unthread left underwear leg, Pull underwear up/down Pants- Performed by patient: Thread/unthread right pants leg Pants- Performed by helper: Thread/unthread left pants leg, Pull pants up/down                      Lower body assist        Toileting Toileting Toileting activity did not occur: No continent bowel/bladder event   Toileting steps completed by helper: Adjust clothing prior to toileting, Performs perineal hygiene, Adjust clothing after toileting Toileting Assistive Devices: Grab bar or rail  Toileting assist     Transfers Chair/bed transfer   Chair/bed transfer method: Ambulatory Chair/bed transfer assist level: Moderate  assist (Pt 50 - 74%/lift or lower) Chair/bed transfer assistive device: Armrests     Locomotion Ambulation     Max distance: 161ft  Assist level: Moderate assist (Pt 50 - 74%)   Wheelchair Wheelchair activity did not occur: Refused Type: Manual      Cognition Comprehension Comprehension assist level: Understands basic 25 - 49% of the time/ requires cueing 50  - 75% of the time  Expression Expression assist level: Expresses basic 25 - 49% of the time/requires cueing 50 - 75% of the time. Uses single words/gestures.  Social Interaction Social Interaction assist level: Interacts appropriately 25 - 49% of time - Needs frequent redirection.  Problem Solving Problem solving assist level: Solves basic 25 - 49% of the time - needs direction more than half the time to initiate, plan or complete simple activities  Memory Memory assist level: Recognizes or recalls less than 25% of the time/requires cueing greater than 75% of the time   Medical Problem List and Plan: 1. Left side weaknesssecondary to right MCA infarct -continue PT, OT, SLP 2. DVT Prophylaxis/Anticoagulation: Subcutaneous Lovenox. Monitor platelet counts and any signs of bleeding 3. Pain Management: Tylenol 4. Mood/cognition: Namenda 10 mg twice a day, Aricept 10 mg daily at bedtime -increased risperdal to 0.5mg  at 8pm for sundowning with good results  -low bed  -restraints still required for patient safety  -check ua/ucx also with low grade temp 5. Neuropsych: This patient isnot capable of making decisions on hisown behalf. 6. Skin/Wound Care: Routine skin checks 7. Fluids/Electrolytes/Nutrition: Routine I&O with follow-up chemistries reviewed -encourage PO 8.Right carotid stenosis. Follow-up vascular surgery. Not a surgical candidate at this time 9.Atrial flutter. Ablation 2013. Cardiac rate controlled -hgb normal, labs reviewed 10. BPH. Avodart 0.5 mg daily. Check PVR 3   LOS (Days) 3 A FACE TO FACE EVALUATION WAS PERFORMED  Meredith Staggers, MD 10/24/2016 8:33 AM

## 2016-10-25 ENCOUNTER — Inpatient Hospital Stay (HOSPITAL_COMMUNITY): Payer: Medicare Other | Admitting: Occupational Therapy

## 2016-10-25 ENCOUNTER — Inpatient Hospital Stay (HOSPITAL_COMMUNITY): Payer: Medicare Other | Admitting: Physical Therapy

## 2016-10-25 LAB — URINE CULTURE

## 2016-10-25 NOTE — Progress Notes (Signed)
[facility] PHYSICAL MEDICINE & REHABILITATION     PROGRESS NOTE    Subjective/Complaints: Restless at times last night but did sleep. Still sleeping when I entered today  ROS: pt denies nausea, vomiting, diarrhea, cough, shortness of breath or chest pain   Objective: Vital Signs: Blood pressure 135/60, pulse 88, temperature 97.9 F (36.6 C), temperature source Oral, resp. rate 18, SpO2 99 %. No results found. No results for input(s): WBC, HGB, HCT, PLT in the last 72 hours. No results for input(s): NA, K, CL, GLUCOSE, BUN, CREATININE, CALCIUM in the last 72 hours.  Invalid input(s): CO CBG (last 3)  No results for input(s): GLUCAP in the last 72 hours.  Wt Readings from Last 3 Encounters:  10/17/16 80.7 kg (177 lb 14.6 oz)  10/17/16 63.2 kg (139 lb 5.3 oz)  04/16/14 75.3 kg (166 lb)    Physical Exam:   Gen: restless.  HENT:  Head: Normocephalic.  Eyes: EOMare normal.  Neck: Normal range of motion. Neck supple. No thyromegalypresent.  Cardiovascular: RRR without murmur. No JVD    Respiratory: CTA Bilaterally without wheezes or rales. Normal effort  GI: Soft. Bowel sounds are normal. He exhibits no distension.  Skin. Warm and dry Musculoskeletal: He exhibits no edemaor tenderness.  Neurological:    Makes eye contact when awakened.   Moves LLE to pain provocation. LUE 0/5. Left inattention Psych: confused  Assessment/Plan: 1. Functional deficits  secondary to right MCA infarct which require 3+ hours per day of interdisciplinary therapy in a comprehensive inpatient rehab setting. Physiatrist is providing close team supervision and 24 hour management of active medical problems listed below. Physiatrist and rehab team continue to assess barriers to discharge/monitor patient progress toward functional and medical goals.  Function:  Bathing Bathing position Bathing activity did not occur: Refused Position: Standing at sink  Bathing parts Body parts bathed by  patient: Front perineal area (He refused to wash any parts other then perineal/buttocks) Body parts bathed by helper: Buttocks  Bathing assist Assist Level: Touching or steadying assistance(Pt > 75%)      Upper Body Dressing/Undressing Upper body dressing Upper body dressing/undressing activity did not occur: Refused What is the patient wearing?: Pull over shirt/dress     Pull over shirt/dress - Perfomed by patient: Pull shirt over trunk, Put head through opening, Thread/unthread right sleeve Pull over shirt/dress - Perfomed by helper: Thread/unthread left sleeve        Upper body assist Assist Level: Touching or steadying assistance(Pt > 75%)      Lower Body Dressing/Undressing Lower body dressing   What is the patient wearing?: Pants, Underwear Underwear - Performed by patient: Thread/unthread right underwear leg Underwear - Performed by helper: Thread/unthread left underwear leg, Pull underwear up/down Pants- Performed by patient: Thread/unthread right pants leg Pants- Performed by helper: Thread/unthread left pants leg, Pull pants up/down                      Lower body assist        Toileting Toileting Toileting activity did not occur: No continent bowel/bladder event   Toileting steps completed by helper: Adjust clothing prior to toileting, Performs perineal hygiene, Adjust clothing after toileting Toileting Assistive Devices: Grab bar or rail  Toileting assist     Transfers Chair/bed transfer   Chair/bed transfer method: Ambulatory Chair/bed transfer assist level: Moderate assist (Pt 50 - 74%/lift or lower) Chair/bed transfer assistive device: Armrests     Locomotion Ambulation  Max distance: 12ft  Assist level: Moderate assist (Pt 50 - 74%)   Wheelchair Wheelchair activity did not occur: Refused Type: Manual      Cognition Comprehension Comprehension assist level: Understands basic 25 - 49% of the time/ requires cueing 50 - 75% of the time   Expression Expression assist level: Expresses basic 25 - 49% of the time/requires cueing 50 - 75% of the time. Uses single words/gestures.  Social Interaction Social Interaction assist level: Interacts appropriately 25 - 49% of time - Needs frequent redirection.  Problem Solving Problem solving assist level: Solves basic 25 - 49% of the time - needs direction more than half the time to initiate, plan or complete simple activities  Memory Memory assist level: Recognizes or recalls less than 25% of the time/requires cueing greater than 75% of the time   Medical Problem List and Plan: 1. Left side weaknesssecondary to right MCA infarct -continue PT, OT, SLP 2. DVT Prophylaxis/Anticoagulation: Subcutaneous Lovenox. Monitor platelet counts and any signs of bleeding 3. Pain Management: Tylenol 4. Mood/cognition: Namenda 10 mg twice a day, Aricept 10 mg daily at bedtime -increased risperdal to 0.5mg  at 8pm for sundowning with good results so far  -low bed  -restraints still required for patient safety  -ua neg, ucx pending 5. Neuropsych: This patient isnot capable of making decisions on hisown behalf. 6. Skin/Wound Care: Routine skin checks 7. Fluids/Electrolytes/Nutrition: Routine I&O with follow-up chemistries reviewed -encourage PO 8.Right carotid stenosis. Follow-up vascular surgery. Not a surgical candidate at this time 9.Atrial flutter. Ablation 2013. Cardiac rate controlled -hgb normal, labs reviewed 10. BPH. Avodart 0.5 mg daily.     LOS (Days) 4 A Suwanee T, MD 10/25/2016 8:58 AM

## 2016-10-25 NOTE — Progress Notes (Signed)
Occupational Therapy Session Note  Patient Details  Name: Kelly Lloyd MRN: [id number] Date of Birth: 07-24-1927  Today's Date: 10/25/2016 OT Individual Time: 0800-0900 OT Individual Time Calculation (min): 60 min    Short Term Goals: Week 1:  OT Short Term Goal 1 (Week 1): Pt will sit to stand with min A in prep for clothing management OT Short Term Goal 2 (Week 1): Pt will locate 3/5 needed grooming items wiht min VC for scanning L on sink OT Short Term Goal 3 (Week 1): Pt will transfer to BSC/toilet with MOD A and LRAD OT Short Term Goal 4 (Week 1): Pt will don shirt with MOD A OT Short Term Goal 5 (Week 1): Pt will complete 2/3 components of LB dressing with touching A  Skilled Therapeutic Interventions/Progress Updates:    Pt asleep and difficult to wake up to start session.  Not oriented to place, time, or situation.  Decreased ability to understand commands for rolling or transition to sitting from supine.  Pt needing total assist to transfer supine to sit with resistance present.  He eventually was able to maintain sustained attention and maintain sitting balance.  Gave him shoes and  max instructional cueing for donning but he would only place them beside of him.  Therapist assisted with donning the left shoe and tying, then pt finally donned the right shoe with therapist tying it.  Had him transfer to the wheelchair with max assist.  Worked on self feeding.  Therapist helped setup tray and apply butter and jelly to his toast.  When given a fork he initially just placed it down and did not attempt use.  Eventually he automatically picked it up and began to self feed eggs and fruit.  He would pick up bacon and toast with the LUE.  Coffee placed on the left side of midline as well as fruit cup.  He was able to scan and located them without cueing.  Part of the way through breakfast he became slightly confused again and attempted to eat his bacon and his toast with his fork instead of picking  it up by hand.  When therapist attempted to cue him, he just continued using the fork instead.  Safety belt in place and pt's daughter-in-law in at end of session to continue supervision for meal.    Therapy Documentation Precautions:  Precautions Precautions: Fall Precaution Comments: Dementia Restrictions Weight Bearing Restrictions: No  Pain: Pain Assessment Pain Assessment: No/denies pain ADL: See Function Navigator for Current Functional Status.   Therapy/Group: Individual Therapy  Hildagarde Holleran,Zale OTR/L 10/25/2016, 12:08 PM

## 2016-10-25 NOTE — Progress Notes (Addendum)
Occupational Therapy Session Note  Patient Details  Name: Kelly Lloyd MRN: [id number] Date of Birth: 26-Oct-1927  Today's Date: 10/25/2016 OT Individual Time: 1015-1045 OT Individual Time Calculation (min): 30 min    Short Term Goals: Week 1:  OT Short Term Goal 1 (Week 1): Pt will sit to stand with min A in prep for clothing management OT Short Term Goal 2 (Week 1): Pt will locate 3/5 needed grooming items wiht min VC for scanning L on sink OT Short Term Goal 3 (Week 1): Pt will transfer to BSC/toilet with MOD A and LRAD OT Short Term Goal 4 (Week 1): Pt will don shirt with MOD A OT Short Term Goal 5 (Week 1): Pt will complete 2/3 components of LB dressing with touching A  Skilled Therapeutic Interventions/Progress Updates:    Pt seen this session for LUE NMR and postural control. Pt received in w/c and taken to gym, worked on stand pivot to R to mat with mod A.  Pt has a left lean, pillow placed under L hip for pelvic alignment.   Pt not able to follow commands for open chain activities, he did better with focusing on a concrete tasks. For example, with commands such as " bend your elbow, lift your arm" pt had great difficulty comprehending. When given a stack of cones to move, he actively used L arm with A for grasp to move cones.    In standing, pt stood up with min A but then pt had posterior lean and all his weight through his heels.  Max A to hold balance and mod to max to transfer back to w/c as pt was demonstrating L in attention especially with foot placement. When cued with one word "walk" he could transfer more efficiently. Quick release belt placed and pt resting in chair in gym for his next therapy session.  Therapy Documentation Precautions:  Precautions Precautions: Fall Precaution Comments: Dementia Restrictions Weight Bearing Restrictions: No    Vital Signs: Therapy Vitals Temp: 97.9 F (36.6 C) Temp Source: Oral Pulse Rate: 88 Resp: 18 BP: 135/60 Patient  Position (if appropriate): Lying Oxygen Therapy SpO2: 99 % O2 Device: Not Delivered Pain: Pain Assessment Pain Assessment: No/denies pain ADL:    See Function Navigator for Current Functional Status.   Therapy/Group: Individual Therapy  [place] 10/25/2016, 8:43 AM

## 2016-10-25 NOTE — Progress Notes (Signed)
Physical Therapy Session Note  Patient Details  Name: Kelly Lloyd MRN: [id number] Date of Birth: 05/13/1927  Today's Date: 10/25/2016 PT Individual Time: 1100-1200 and 1450-1535 PT Individual Time Calculation (min): 60 min and 45 min  Short Term Goals: Week 1:  PT Short Term Goal 1 (Week 1): Pt will perform sit<>stand with min assist consistently PT Short Term Goal 2 (Week 1): Pt will initate WC mobility  PT Short Term Goal 3 (Week 1): Pt will ambulate 130ft with mod assist and LRAD  PT Short Term Goal 4 (Week 1): Pt will perform bed mobiltiy with min assist from PT.   Skilled Therapeutic Interventions/Progress Updates: Pt presented in gym hand off from OT. Performed sit to stand from w/c minA, ambulated 179ft, 122ft x 2 with RW with minA straight path, modA turns, tactile cues for staying inside RW and erect posture. Pt with noted posterior lean when transferring into chair.  Pt initially resistant to use of L hand splint, improved once padded with washcloth.  Practiced sit to stand w/c to mat with RW with minA. Tactile cues for hand placement and decreased safety awareness noted. Attempted static reaching with RW however pt perseverative on reaching over to LUE and removing splint. Performed NuStep for reciprocal activity L1 x 10 min with L hand splint placed. Performed squat pivot transfer to w/c with minA for safety. Pt returned to room with wife present, QRB, and half lap tray placed.   Tx2: Pt presented in w/c with family present. Performed w/c mobility use only of BLE. Pt able to follow directions if kept simple, "turn" while pointing or go to "X" object. If more complicated directions pt unable to follow. Pt propelled 235ft with minA. Transported to rehab gym, and performed standing balance activities including placing horseshoes on basketball hoop and playing horseshoes with LUE in splint and use of RUE. Pt able to reach high low with decreased posterior lean. Pt performed sit/stands  throughout treatment with varying levels of assistance from minA to min guard. Pt would intermittently use safe hand placement as unable to consistently follow commands. Attempted standing on wedge for forced anterior wt shift however pt unable to correct forward flexed posture despite multimodal cues. Pt ambulated 140ft to room and returned to w/c with QRB and half lap tray in place with family present.   Therapy Documentation Precautions:  Precautions Precautions: Fall Precaution Comments: Dementia Restrictions Weight Bearing Restrictions: No General:   Vital Signs: Therapy Vitals Temp: 98 F (36.7 C) Temp Source: Oral Pulse Rate: 76 Resp: 18 BP: (!) 98/46 Patient Position (if appropriate): Sitting Oxygen Therapy SpO2: 97 % O2 Device: Not Delivered   See Function Navigator for Current Functional Status.   Therapy/Group: Individual Therapy  Kimbely Whiteaker 10/25/2016, 4:05 PM

## 2016-10-26 ENCOUNTER — Inpatient Hospital Stay (HOSPITAL_COMMUNITY): Payer: Medicare Other | Admitting: Physical Therapy

## 2016-10-26 ENCOUNTER — Inpatient Hospital Stay (HOSPITAL_COMMUNITY): Payer: Medicare Other

## 2016-10-26 ENCOUNTER — Inpatient Hospital Stay (HOSPITAL_COMMUNITY): Payer: Medicare Other | Admitting: Occupational Therapy

## 2016-10-26 NOTE — Progress Notes (Signed)
Occupational Therapy Session Note  Patient Details  Name: Kelly Lloyd MRN: [id number] Date of Birth: 1927/09/16  Today's Date: 10/26/2016 OT Individual Time: 1030-1155 OT Individual Time Calculation (min): 85 min    Short Term Goals: Week 1:  OT Short Term Goal 1 (Week 1): Pt will sit to stand with min A in prep for clothing management OT Short Term Goal 2 (Week 1): Pt will locate 3/5 needed grooming items wiht min VC for scanning L on sink OT Short Term Goal 3 (Week 1): Pt will transfer to BSC/toilet with MOD A and LRAD OT Short Term Goal 4 (Week 1): Pt will don shirt with MOD A OT Short Term Goal 5 (Week 1): Pt will complete 2/3 components of LB dressing with touching A  Skilled Therapeutic Interventions/Progress Updates:    Pt resting in w/c upon arrival with son present.  Pt initially engaged in BADL retraining including bathing/dressing with sit<>stand from w/c at sink.  Pt required max multimodal cues to initiate bathing/dressing tasks with hand-over-hand assist provided approx 50% of time to initiate.  Pt exhibits significant apraxia with several bathing/dressing tasks.  Pt attempted to apply deodorant to several inappropriate body parts.  Pt required max multimodal cues to incorporate LUE into functional tasks.  Pt transitioned to gym for focus on following one step functional commands and LUE use during tasks.  Pt requires max multimodal and demonstrational cues to initiate tasks.  Pt easily distracted and requires max verbal cues to redirect to task.  Music provided (Elvis) and attempted to sing along with songs on several occasions.  Pt returned to room and remained in w/c with QRB and half lap tray in place.  Elbow protector placed on L elbow.  [name] present.    Therapy Documentation Precautions:  Precautions Precautions: Fall Precaution Comments: Dementia Restrictions Weight Bearing Restrictions: No   Pain: Pain Assessment Pain Assessment: No/denies pain  See  Function Navigator for Current Functional Status.   Therapy/Group: Individual Therapy  Leroy Libman 10/26/2016, 12:14 PM

## 2016-10-26 NOTE — Progress Notes (Signed)
Physical Therapy Session Note  Patient Details  Name: Kelly Lloyd MRN: [id number] Date of Birth: 07-17-1927  Today's Date: 10/26/2016 PT Individual Time: 1350-1500 PT Individual Time Calculation (min): 70 min   Short Term Goals: Week 1:  PT Short Term Goal 1 (Week 1): Pt will perform sit<>stand with min assist consistently PT Short Term Goal 2 (Week 1): Pt will initate WC mobility  PT Short Term Goal 3 (Week 1): Pt will ambulate 142ft with mod assist and LRAD  PT Short Term Goal 4 (Week 1): Pt will perform bed mobiltiy with min assist from PT.   Skilled Therapeutic Interventions/Progress Updates: Tx1: Pt presented in w/c sleeping but easily aroused.with wife present. Transported to rehab gym, performed squat pivot transfer to mat with minA for safety. Pt participated in seated balance activities including catching ball with encouraged use of RUE. Pt with x 3 occurences of LOB posteriorly to R with pt initiating use in RUE to attempt to catch. Pt able to correct posture to midline with verbal cues. Participated in kicking ball in unsupported seated to BLE. Sit to supine with modA, attempted supine therex for R NMR however pt with increased difficulty following commands. Supine to sit minA with improved response to verbal cues. Ambulated 110ft x 2 with RW and minA, poor negotiation around objects and decreased control with RW. Performed NuStep L3 x 8 min for reciprocal NMR and conditioning. Pt returned to room with QRB, half lap try and wife present.         Therapy Documentation Precautions:  Precautions Precautions: Fall Precaution Comments: Dementia Restrictions Weight Bearing Restrictions: No General:   Vital Signs: Therapy Vitals Temp: 98 F (36.7 C) Temp Source: Oral Pulse Rate: 81 Resp: 18 BP: (!) 112/55 Patient Position (if appropriate): Sitting Oxygen Therapy SpO2: 95 % O2 Device: Not Delivered   See Function Navigator for Current Functional  Status.   Therapy/Group: Individual Therapy  [place] Vise  Khaliel Morey, PTA  10/26/2016, 3:58 PM

## 2016-10-26 NOTE — Progress Notes (Signed)
[facility] PH[facility]ICINE & REHABILITATION     PROGRESS NOTE    Subjective/Complaints: Slept several hours last night. Restless this am when I entered. Trying to slide out of bed.   ROS: limited by cognition   Objective: Vital Signs: Blood pressure 119/81, pulse 86, temperature 97.9 F (36.6 C), temperature source Oral, resp. rate 18, SpO2 98 %. No results found. No results for input(s): WBC, HGB, HCT, PLT in the last 72 hours. No results for input(s): NA, K, CL, GLUCOSE, BUN, CREATININE, CALCIUM in the last 72 hours.  Invalid input(s): CO CBG (last 3)  No results for input(s): GLUCAP in the last 72 hours.  Wt Readings from Last 3 Encounters:  10/17/16 80.7 kg (177 lb 14.6 oz)  10/17/16 63.2 kg (139 lb 5.3 oz)  04/16/14 75.3 kg (166 lb)    Physical Exam:   Gen: restless.  HENT:  Head: Normocephalic.  Eyes: EOMare normal.  Neck: Normal range of motion. Neck supple. No thyromegalypresent.  Cardiovascular: RRR without murmur. No JVD    Respiratory: CTA Bilaterally without wheezes or rales. Normal effort   GI: Soft. Bowel sounds are normal. He exhibits no distension.  Skin. Warm and dry Musculoskeletal: He exhibits no edemaor tenderness.  Neurological:    Makes eye contact and can follow simple commands.    Moves LLE to pain provocation. LUE 0/5. Left inattention still present Psych: confused and restless.   Assessment/Plan: 1. Functional deficits  secondary to right MCA infarct which require 3+ hours per day of interdisciplinary therapy in a comprehensive inpatient rehab setting. Physiatrist is providing close team supervision and 24 hour management of active medical problems listed below. Physiatrist and rehab team continue to assess barriers to discharge/monitor patient progress toward functional and medical goals.  Function:  Bathing Bathing position Bathing activity did not occur: Refused Position: Standing at sink  Bathing parts Body parts bathed by  patient: Front perineal area (He refused to wash any parts other then perineal/buttocks) Body parts bathed by helper: Buttocks  Bathing assist Assist Level: Touching or steadying assistance(Pt > 75%)      Upper Body Dressing/Undressing Upper body dressing Upper body dressing/undressing activity did not occur: Refused What is the patient wearing?: Pull over shirt/dress     Pull over shirt/dress - Perfomed by patient: Pull shirt over trunk, Put head through opening, Thread/unthread right sleeve Pull over shirt/dress - Perfomed by helper: Thread/unthread left sleeve        Upper body assist Assist Level: Touching or steadying assistance(Pt > 75%)      Lower Body Dressing/Undressing Lower body dressing   What is the patient wearing?: Shoes Underwear - Performed by patient: Thread/unthread right underwear leg Underwear - Performed by helper: Thread/unthread left underwear leg, Pull underwear up/down Pants- Performed by patient: Thread/unthread right pants leg Pants- Performed by helper: Thread/unthread left pants leg, Pull pants up/down         Shoes - Performed by patient: Don/doff right shoe Shoes - Performed by helper: Fasten right, Fasten left, Don/doff left shoe          Lower body assist        Toileting Toileting Toileting activity did not occur: No continent bowel/bladder event   Toileting steps completed by helper: Adjust clothing prior to toileting, Performs perineal hygiene, Adjust clothing after toileting Toileting Assistive Devices: Grab bar or rail  Toileting assist     Transfers Chair/bed transfer   Chair/bed transfer method: Ambulatory Chair/bed transfer assist level: Maximal assist (Pt  25 - 49%/lift and lower) Chair/bed transfer assistive device: Armrests     Locomotion Ambulation     Max distance: 121ft  Assist level: Moderate assist (Pt 50 - 74%)   Wheelchair Wheelchair activity did not occur: Refused Type: Manual       Cognition Comprehension Comprehension assist level: Understands basic 25 - 49% of the time/ requires cueing 50 - 75% of the time  Expression Expression assist level: Expresses basis less than 25% of the time/requires cueing >75% of the time.  Social Interaction Social Interaction assist level: Interacts appropriately 25 - 49% of time - Needs frequent redirection.  Problem Solving Problem solving assist level: Solves basic less than 25% of the time - needs direction nearly all the time or does not effectively solve problems and may need a restraint for safety  Memory Memory assist level: Recognizes or recalls less than 25% of the time/requires cueing greater than 75% of the time   Medical Problem List and Plan: 1. Left side weaknesssecondary to right MCA infarct -continue PT, OT, SLP  -he is ultimately going to be limited by his baseline cognitive deficits. Team to discuss tomorrow 2. DVT Prophylaxis/Anticoagulation: Subcutaneous Lovenox. Monitor platelet counts and any signs of bleeding 3. Pain Management: Tylenol 4. Mood/cognition: Namenda 10 mg twice a day, Aricept 10 mg daily at bedtime -continue risperdal 0.5mg  at 8pm for sundowning-good results so far  -low bed  -restraints still required for patient safety  -ua neg, ucx with multispecies---will re-send for ucx 5. Neuropsych: This patient isnot capable of making decisions on hisown behalf. 6. Skin/Wound Care: Routine skin checks 7. Fluids/Electrolytes/Nutrition: Routine I&O with follow-up chemistries reviewed -encourage PO 8.Right carotid stenosis. Follow-up vascular surgery. Not a surgical candidate at this time 9.Atrial flutter. Ablation 2013. Cardiac rate controlled -hgb normal  10. BPH. Avodart 0.5 mg daily.     LOS (Days) 5 A FACE TO FACE EVALUATION WAS PERFORMED  Meredith Staggers, MD 10/26/2016 12:01 PM

## 2016-10-26 NOTE — Progress Notes (Signed)
Occupational Therapy Session Note  Patient Details  Name: Kelly Lloyd MRN: [id number] Date of Birth: [birth year]  Today's Date: 10/26/2016 OT Individual Time: 1300-1330 OT Individual Time Calculation (min): 30 min    Short Term Goals: Week 1:  OT Short Term Goal 1 (Week 1): Pt will sit to stand with min A in prep for clothing management OT Short Term Goal 2 (Week 1): Pt will locate 3/5 needed grooming items wiht min VC for scanning L on sink OT Short Term Goal 3 (Week 1): Pt will transfer to BSC/toilet with MOD A and LRAD OT Short Term Goal 4 (Week 1): Pt will don shirt with MOD A OT Short Term Goal 5 (Week 1): Pt will complete 2/3 components of LB dressing with touching A  Skilled Therapeutic Interventions/Progress Updates:    Pt seen for OT session focusing on ADL re-training and attention to task. Pt sitting up in w/c upon arrival with wife present and agreeable to tx session.  He completed hand hygiene at sink, requiring assist to locate soap dispenser on L and with increased time due to ataxia was able to wash L UE, however, had to be cued throughout task regarding purpose of time at sink.  Oral care completed with total A to locate toothbrush despite max cuing and pt attempting to brush teeth prior to placing toothpaste. Following total A cuing to use toothpaste again required total A to place toothpaste on proper part of brush. From there, he was able to brush teeth with supervision.  Had pt complete towel folding activity with goal of pt automatically incorporating L UE into functional task. Despite increased time in attempt of task pt did not initiate use of L UE. Attempted task in standing, however, pt requiring max A to stand as he did not let go of w/c armrest upon standing and was unable to follow multimodal cuing for directions. Attempted stand at [facility] which was more successful as pt placed B UEs on RW, however, was unable to be cued to step into RW for effective use for  balance. Pt returned to w/c at end of session, left with QRB donned and wife present. Discussed with wife pt's prior level of function, goals, and d/cplanning.   Therapy Documentation Precautions:  Precautions Precautions: Fall Precaution Comments: Dementia Restrictions Weight Bearing Restrictions: No Pain:   No/ denies pain  See Function Navigator for Current Functional Status.   Therapy/Group: Individual Therapy  Lewis, Ellard Nan C 10/26/2016, 7:17 AM

## 2016-10-27 ENCOUNTER — Inpatient Hospital Stay (HOSPITAL_COMMUNITY): Payer: Medicare Other | Admitting: Physical Therapy

## 2016-10-27 ENCOUNTER — Inpatient Hospital Stay (HOSPITAL_COMMUNITY): Payer: Medicare Other | Admitting: Occupational Therapy

## 2016-10-27 DIAGNOSIS — G8194 Hemiplegia, unspecified affecting left nondominant side: Secondary | ICD-10-CM

## 2016-10-27 MED ORDER — APIXABAN 5 MG PO TABS
5.0000 mg | ORAL_TABLET | Freq: Two times a day (BID) | ORAL | Status: DC
  Administered 2016-10-27 – 2016-11-08 (×25): 5 mg via ORAL
  Filled 2016-10-27 (×25): qty 1

## 2016-10-27 NOTE — Progress Notes (Signed)
[facility]YS[facility]ICINE & RE[facility]ABILITATION     PROGRESS NOTE    Subjective/Complaints:  No issues overnite, remains confused , ? baseline  ROS: limited by cognition   Objective: Vital Signs: Blood pressure 139/75, pulse 78, temperature 97.9 F (36.6 C), temperature source Oral, resp. rate 18, SpO2 97 %. No results found. No results for input(s): WBC, HGB, HCT, PLT in the last 72 hours. No results for input(s): NA, K, CL, GLUCOSE, BUN, CREATININE, CALCIUM in the last 72 hours.  Invalid input(s): CO CBG (last 3)  No results for input(s): GLUCAP in the last 72 hours.  Wt Readings from Last 3 Encounters:  10/17/16 80.7 kg (177 lb 14.6 oz)  10/17/16 63.2 kg (139 lb 5.3 oz)  04/16/14 75.3 kg (166 lb)    Physical Exam:   Gen: restless.  HENT:  Head: Normocephalic.  Eyes: EOMare normal.  Neck: Normal range of motion. Neck supple. No thyromegalypresent.  Cardiovascular: RRR without murmur. No JVD    Respiratory: CTA Bilaterally without wheezes or rales. Normal effort   GI: Soft. Bowel sounds are normal. He exhibits no distension.  Skin. Warm and dry Musculoskeletal: He exhibits no edemaor tenderness.  Neurological:    Makes eye contact and can follow simple commands.    Moves LLE to pain provocation. LUE 0/5. Left inattention still present Psych: confused and restless.   Assessment/Plan: 1. Functional deficits  secondary to right MCA infarct which require 3+ hours per day of interdisciplinary therapy in a comprehensive inpatient rehab setting. Physiatrist is providing close team supervision and 24 hour management of active medical problems listed below. Physiatrist and rehab team continue to assess barriers to discharge/monitor patient progress toward functional and medical goals.  Function:  Bathing Bathing position Bathing activity did not occur: Refused (only consented to peri area) Position: Wheelchair/chair at sink  Bathing parts Body parts bathed by  patient: Abdomen, Left arm, Chest, Right upper leg Body parts bathed by helper: Right arm, Front perineal area, Buttocks, Left upper leg, Right lower leg, Left lower leg  Bathing assist Assist Level: Touching or steadying assistance(Pt > 75%)      Upper Body Dressing/Undressing Upper body dressing Upper body dressing/undressing activity did not occur: Refused What is the patient wearing?: Pull over shirt/dress     Pull over shirt/dress - Perfomed by patient: Thread/unthread right sleeve, Thread/unthread left sleeve Pull over shirt/dress - Perfomed by helper: Put head through opening, Pull shirt over trunk        Upper body assist Assist Level: Touching or steadying assistance(Pt > 75%)      Lower Body Dressing/Undressing Lower body dressing   What is the patient wearing?: Pants, Socks, Shoes Underwear - Performed by patient: Thread/unthread right underwear leg Underwear - Performed by helper: Thread/unthread left underwear leg, Pull underwear up/down Pants- Performed by patient: Thread/unthread left pants leg Pants- Performed by helper: Thread/unthread right pants leg, Pull pants up/down       Socks - Performed by helper: Don/doff right sock, Don/doff left sock Shoes - Performed by patient: Don/doff right shoe, Don/doff left shoe Shoes - Performed by helper: Fasten right, Fasten left          Lower body assist Assist for lower body dressing:  (4/6, 67%, moderate assist)      Toileting Toileting Toileting activity did not occur: No continent bowel/bladder event   Toileting steps completed by helper: Adjust clothing prior to toileting, Performs perineal hygiene, Adjust clothing after toileting Toileting Assistive Devices: Grab bar or  rail  Toileting assist Assist level: Two helpers (pre Benjamin Stain, [place])   Transfers Chair/bed transfer   Chair/bed transfer method: Ambulatory Chair/bed transfer assist level: Maximal assist (Pt 25 - 49%/lift and lower) Chair/bed transfer  assistive device: Armrests     Locomotion Ambulation     Max distance: 149f  Assist level: Moderate assist (Pt 50 - 74%)   Wheelchair Wheelchair activity did not occur: Refused Type: Manual      Cognition Comprehension Comprehension assist level: Understands basic 25 - 49% of the time/ requires cueing 50 - 75% of the time  Expression Expression assist level: Expresses basis less than 25% of the time/requires cueing >75% of the time.  Social Interaction Social Interaction assist level: Interacts appropriately 25 - 49% of time - Needs frequent redirection.  Problem Solving Problem solving assist level: Solves basic less than 25% of the time - needs direction nearly all the time or does not effectively solve problems and may need a restraint for safety  Memory Memory assist level: Recognizes or recalls less than 25% of the time/requires cueing greater than 75% of the time   Medical Problem List and Plan: 1. Left side weaknesssecondary to right MCA infarct -continue PT, OT, SLP  -Team conference today please see physician documentation under team conference tab, met with team face-to-face to discuss problems,progress, and goals. Formulized individual treatment plan based on medical history, underlying problem and comorbidities.2. DVT Prophylaxis/Anticoagulation: Subcutaneous Lovenox. Monitor platelet counts and any signs of bleeding 3. Pain Management: Tylenol 4. Mood/cognition: Namenda 10 mg twice a day, Aricept 10 mg daily at bedtime -continue risperdal 0.59mat 8pm for sundowning-good results so far  -low bed  -restraints still required for patient safety  -ua neg, ucx with multispecies---will re-send for ucx 5. Neuropsych: This patient isnot capable of making decisions on hisown behalf. 6. Skin/Wound Care: 2 stage 2 areas  Sup aspect of gluteal cleft and Left upper sacral area no drainage or odor- foam dressing 7. Fluids/Electrolytes/Nutrition: Routine I&O  with follow-up chemistries reviewed -75-100% meal intake with >100067mluid intake in last 24h 8.Right carotid stenosis. Follow-up vascular surgery. Not a surgical candidate at this time 9.Atrial flutter. Ablation 2013. Cardiac rate controlled -hgb normal  10. BPH. Avodart 0.5 mg daily.     LOS (Days) 6 A FACE TO FACE EVALUATION WAS PERFORMED  KIRCharlett BlakeD 10/27/2016 8:54 AM

## 2016-10-27 NOTE — Progress Notes (Signed)
Occupational Therapy Session Note  Patient Details  Name: Kelly Lloyd MRN: [id number] Date of Birth: 10/26/1927  Today's Date: 10/27/2016 OT Individual Time: 1415-1520 OT Individual Time Calculation (min): 65 min    Short Term Goals: Week 1:  OT Short Term Goal 1 (Week 1): Pt will sit to stand with min A in prep for clothing management OT Short Term Goal 2 (Week 1): Pt will locate 3/5 needed grooming items wiht min VC for scanning L on sink OT Short Term Goal 3 (Week 1): Pt will transfer to BSC/toilet with MOD A and LRAD OT Short Term Goal 4 (Week 1): Pt will don shirt with MOD A OT Short Term Goal 5 (Week 1): Pt will complete 2/3 components of LB dressing with touching A  Skilled Therapeutic Interventions/Progress Updates:    Pt seen for OT session focusing on attention to task and sorting during functional activity. Pt sitting up in w/c upon arrival, agreeable to tx session.  Pt taken to rehab apartment total A in w/c for time and energy conservation. He competed sorting Optometrist, able to successfully separate forks and spoons with min questioning cues to self correct mistake at end of task. He stood at cabinet with min A to remove items from overhead cabinet. Pt not initiating use of L UE during task and had no awareness of therapist repositioning L UE into supported position during task. Without cuing, pt able to correctly separate pantry items by type of food. In therapy gym, completed sorting activity of beanbags, initially pt having difficulty following instructions by therapist, however, when given the libery to sort the bean bags how he desired pt able to attend to task ~20 minutes to sort objects by color. Ultimately had to have wife come get pt from therapy as he was unoriented to where he was and refusing to leave task. Pt remains disoriented to place and situation making transition from tasks difficult for pt. He returned to room and left sitting up in w/c with friends  present. Required education and distraction for use of QRB for safety, pt with no safety awareness/ awareness of deficits and therefore does not understand need for belt to be donned. [name] educated regarding purpose of belt and next degree of restraint if required. She did admit to pt having sundowning tendencies PTA.    Therapy Documentation Precautions:  Precautions Precautions: Fall Precaution Comments: Dementia Restrictions Weight Bearing Restrictions: No Pain:   No/ denies pain  See Function Navigator for Current Functional Status.   Therapy/Group: Individual Therapy  Lewis, Attie Nawabi C 10/27/2016, 3:22 PM

## 2016-10-27 NOTE — Progress Notes (Signed)
Social Work Patient ID: Kelly Lloyd, male   DOB: [birth year], 83 y.o.   MRN: [id number]  Met with wife and spoke with daughter-Dayle to discuss team conference goals of min assist and target discharge 7/21. Discussed if Bethena Roys could manage pt at a min assist level, which she responded she can not. Only way to go home would be to hire 24 hr care for him and set a bedroom on the first floor of their house. Both discussed going to a SNF then transitioning to a memory care once at the level he can be managed there. Made aware their insurance may not cover this since he came to rehab. They will get choices To this worker and then a NH can be pursued and see if insurance will cover.

## 2016-10-27 NOTE — Progress Notes (Signed)
Neurology Dr.Xu requests patient to begin Eliquis 5 mg twice a day in light of history of atrial fibrillation. Aspirin discontinued. All issues discussed with family. New orders have been placed

## 2016-10-27 NOTE — Progress Notes (Signed)
Occupational Therapy Session Note  Patient Details  Name: Kadrian Partch Bouwman MRN: [id number] Date of Birth: 10/04/1927  Today's Date: 10/27/2016 OT Individual Time: 3664-4034 OT Individual Time Calculation (min): 55 min    Short Term Goals: Week 1:  OT Short Term Goal 1 (Week 1): Pt will sit to stand with min A in prep for clothing management OT Short Term Goal 2 (Week 1): Pt will locate 3/5 needed grooming items wiht min VC for scanning L on sink OT Short Term Goal 3 (Week 1): Pt will transfer to BSC/toilet with MOD A and LRAD OT Short Term Goal 4 (Week 1): Pt will don shirt with MOD A OT Short Term Goal 5 (Week 1): Pt will complete 2/3 components of LB dressing with touching A  Skilled Therapeutic Interventions/Progress Updates:    Session One:  Pt seen for OT session focusing on ADL re-training and education with pt's wife. Pt sitting up in w/c upon arrival, agreeable to tx session.  Completed UB bathing/dressing and grooming tasks from w/c level at sink. He was able to doff shirt with VCs for attention to task, however, able to attend to L UE during task. He required max multimodal cuing and assist to thread  B UEs into shirt and from there able to place overhead.  Pt initiated use of L UE to hold toothbrush in prep for applying toothpaste, however, due to decreased control and attention he required min A to functioanlly grab and maintain functional positioning of toothbrush. He was provided with shampoo shower cap per wife's request, completed total A. Pt able to attend to all areas of head to brush hair with set-up assist. Pt left seated in w/c at end of session, wife present and QRB donned. Extensive time spent educating pt's wife regarding his functional deficits, CVA, dementia, d/c planning and continuum of care. [name] and family may benefit from family conference, made CSW aware.   Therapy Documentation Precautions:  Precautions Precautions: Fall Precaution Comments:  Dementia Restrictions Weight Bearing Restrictions: No Pain:   No/ denies pain  See Function Navigator for Current Functional Status.   Therapy/Group: Individual Therapy  Lewis, Aleecia Tapia C 10/27/2016, 7:09 AM

## 2016-10-27 NOTE — Progress Notes (Signed)
Occupational Therapy Session Note  Patient Details  Name: Kelly Lloyd MRN: [id number] Date of Birth: 11-Feb-1928  Today's Date: 10/27/2016 OT Individual Time: 9242-6834 OT Individual Time Calculation (min): 35 min    Short Term Goals: Week 1:  OT Short Term Goal 1 (Week 1): Pt will sit to stand with min A in prep for clothing management OT Short Term Goal 2 (Week 1): Pt will locate 3/5 needed grooming items wiht min VC for scanning L on sink OT Short Term Goal 3 (Week 1): Pt will transfer to BSC/toilet with MOD A and LRAD OT Short Term Goal 4 (Week 1): Pt will don shirt with MOD A OT Short Term Goal 5 (Week 1): Pt will complete 2/3 components of LB dressing with touching A  Skilled Therapeutic Interventions/Progress Updates:    1:1 Pt in bed eating breakfast when arrived.  Pt only oriented to self but very pleasant and social.  At time pt with very confused speech and unintellagable. Pt came to EOB with min A but difficulty maintain upright position on uneven surface falling to the left.  Pt able to perform sit to stands throughout session with min to modA.  Pt ambulated to bathroom with RW with mod to max A with therapist guiding RW in correct direction.  Pt able to void contiently on toilet.  Ambulated to sink with mod A with RW to perform LB bathing and dressing. Pt with very dry skin on bilteral feet so therapist assisted with skin care for feet.  PT able to participate in bathing and dressing with instructual cues and VC for attention left.  Pt with no automatic incorporation of left Ue in any functional activity requiring max A.  Left pt with safety belt at RN station.    Therapy Documentation Precautions:  Precautions Precautions: Fall Precaution Comments: Dementia Restrictions Weight Bearing Restrictions: No Pain: No c/o pain in session   Other Treatments:    See Function Navigator for Current Functional Status.   Therapy/Group: Individual Therapy  Willeen Cass  [facility] 10/27/2016, 9:24 AM

## 2016-10-28 ENCOUNTER — Inpatient Hospital Stay (HOSPITAL_COMMUNITY): Payer: Medicare Other | Admitting: Physical Therapy

## 2016-10-28 ENCOUNTER — Inpatient Hospital Stay (HOSPITAL_COMMUNITY): Payer: Medicare Other | Admitting: Occupational Therapy

## 2016-10-28 LAB — CREATININE, SERUM
CREATININE: 1.17 mg/dL (ref 0.61–1.24)
GFR calc Af Amer: >60 mL/min (ref >60)
GFR calc non Af Amer: 54 mL/min — ABNORMAL LOW (ref >60)

## 2016-10-28 NOTE — Progress Notes (Signed)
Occupational Therapy Session Note  Patient Details  Name: Kelly Lloyd MRN: [id number] Date of Birth: [birth year]  Today's Date: 10/28/2016 OT Individual Time: 0930-1030 and 1430-1453 OT Individual Time Calculation (min): 60 min and 23 min   Short Term Goals: Week 1:  OT Short Term Goal 1 (Week 1): Pt will sit to stand with min A in prep for clothing management OT Short Term Goal 2 (Week 1): Pt will locate 3/5 needed grooming items wiht min VC for scanning L on sink OT Short Term Goal 3 (Week 1): Pt will transfer to BSC/toilet with MOD A and LRAD OT Short Term Goal 4 (Week 1): Pt will don shirt with MOD A OT Short Term Goal 5 (Week 1): Pt will complete 2/3 components of LB dressing with touching A  Skilled Therapeutic Interventions/Progress Updates:    Session One: Pt seen for OT ADL bathing/dressing session. Pt received with hand off from RN following assisting with toileting task. Pt pleasant though not oriented to place or situation. Completed bathing/dressing from w/c level at sink. Pt with much better attention to L side during bathing task, automatically attending to that side to bathe and initiated use of L hand to wash R arm, however, required min A to begin due to ataxia, however, once started in motion pt able to cont bathing of R UE with L hand. Assist required to thread limbs into shirt/pants and then pt able to finish task. He stood at sink to complete grooming task, washing hands multiple times, however, addressing L hand each time with min A . He stood with close supervision and CGA provided for safety.  Pt becomes slightly agitated during certain parts of dressing tasks as he does not understand why therapist must be so close to him or is offering assistance for such tasks as tying shoes. Pt able to be calmly re-directed with increased time and able to cont on with therapy. He required lots of education and calming techniques in order to allow for QRB to be donned.  [name]  present during session and education provided regarding pt's rate of return, ataxia and functional implications, continuum of care and d/c planning.   Session Two: Pt seen for OT session focusing on attention to task and safety with mobility. Pt received in w/c with hand off from PT. In therapy day room attempted to have pt stand to complete "table washing task" for L UE ROM/ weightbearing. Lots of encouragement required for participation. Finally p agreeable to complete, however, did not initiate/ could be cued to use L UE during task. Therapist was able to position L UE into weightbearing position while pt washed table. Pt then becoming agitated/ disorientated with therapists min A and attempting to "walk away" unable to be directed back to chair. RN attempting to help with distraction, however, pt cont to loose balance and requiring +2 total A to place into chair and prevent fall. Pt returned to w/c and taken back to room to wife per request. Pt left sitting up in chair with wife and son present, QRB donned. Education provided regarding possible sundowning effects, may modify therapy schedule in order to best suite pt. Discussed behavioral changes with RN.   Therapy Documentation Precautions:  Precautions Precautions: Fall Precaution Comments: Dementia Restrictions Weight Bearing Restrictions: No Pain:   No/deneies pain  See Function Navigator for Current Functional Status.   Therapy/Group: Individual Therapy  Lewis, Payne Garske C 10/28/2016, 7:07 AM

## 2016-10-28 NOTE — Progress Notes (Signed)
Occupational Therapy Session Note  Patient Details  Name: Kelly Lloyd MRN: [id number] Date of Birth: [birth year]  Today's Date: 10/28/2016 OT Individual Time: 1130-1200 OT Individual Time Calculation (min): 30 min   Skilled Therapeutic Interventions/Progress Updates:    1:1 Presented numerous opportunities for purpose familiar tasks both in standing and sitting but pt with difficulty with transition away from his wife so she could get lunch. Pt resisting therpist taking pt out of room - unwilling to propel with bilateral feet. Once bilateral feet rest were on pt able to be transitioned to the dayroom but declined to assist with functional table setting task. Pt continued to worry about his wife absence and what was going to happen next.  Left in room with wife with quick release belt setup in prep for lunch.   Therapy Documentation Precautions:  Precautions Precautions: Fall Precaution Comments: Dementia Restrictions Weight Bearing Restrictions: No Pain: No c/o pain  See Function Navigator for Current Functional Status.   Therapy/Group: Individual Therapy  Willeen Cass [facility] 10/28/2016, 2:51 PM

## 2016-10-28 NOTE — Progress Notes (Signed)
Nutrition Follow-up  DOCUMENTATION CODES:   Severe malnutrition in context of chronic illness  INTERVENTION:  Continue Ensure Enlive po TID, each supplement provides 350 kcal and 20 grams of protein.  Encourage adequate PO intake.   NUTRITION DIAGNOSIS:   Malnutrition (severe) related to chronic illness (dementia) as evidenced by severe depletion of body fat, severe depletion of muscle mass; ongoing  GOAL:   Patient will meet greater than or equal to 90% of their needs; met  MONITOR:   PO intake, Supplement acceptance, Labs, Weight trends, Skin, I & O's  REASON FOR ASSESSMENT:   Malnutrition Screening Tool    ASSESSMENT:   83 y.o. right handed male with history of atrial flutter, dementia maintained on Namenda.  Presented 10/17/2016 after being found down with left-sided weakness. MRI reviewed, showing multiple right infarcts. scattered acute infarcts in the right MCA territory most confluent about the upper central sulcus where there was petechial hemorrhage.   Meal completion has been 80-100% with 100% at breakfast this AM. Pt currently has Ensure ordered and has been consuming them. Intake has been adequate. RD to continue with current orders.   Diet Order:  Diet regular Room service appropriate? Yes; Fluid consistency: Thin  Skin:  Wound (see comment) (Stage II to buttocks and sacrum)  Last BM:  7/10  Height:   Ht Readings from Last 1 Encounters:  10/17/16 6' (1.829 m)    Weight:   Wt Readings from Last 1 Encounters:  10/17/16 177 lb 14.6 oz (80.7 kg)    Ideal Body Weight:  80.9 kg  BMI:  There is no height or weight on file to calculate BMI.  Estimated Nutritional Needs:   Kcal:  1700-1900  Protein:  90-100 grams  Fluid:  1.7 - 1.9 L/day  EDUCATION NEEDS:   No education needs identified at this time  Corrin Parker, MS, RD, LDN Pager # [phone] After hours/ weekend pager # [phone]

## 2016-10-28 NOTE — Progress Notes (Signed)
[place] PHYSICAL MEDICINE & REHABILITATION     PROGRESS NOTE    Subjective/Complaints:  Slept well, responded well to Risperdal up at around 5 AM. Appreciate social work note.  ROS: limited by cognition   Objective: Vital Signs: Blood pressure 108/68, pulse 78, temperature 98.9 F (37.2 C), temperature source Oral, resp. rate 16, SpO2 100 %. No results found. No results for input(s): WBC, HGB, HCT, PLT in the last 72 hours.  Recent Labs  10/28/16 0457  CREATININE 1.17   CBG (last 3)  No results for input(s): GLUCAP in the last 72 hours.  Wt Readings from Last 3 Encounters:  10/17/16 80.7 kg (177 lb 14.6 oz)  10/17/16 63.2 kg (139 lb 5.3 oz)  04/16/14 75.3 kg (166 lb)    Physical Exam:   Gen: restless.  HENT:  Head: Normocephalic.  Eyes: EOMare normal.  Neck: Normal range of motion. Neck supple. No thyromegalypresent.  Cardiovascular: RRR without murmur. No JVD    Respiratory: CTA Bilaterally without wheezes or rales. Normal effort   GI: Soft. Bowel sounds are normal. He exhibits no distension.  Skin. Warm and dry Musculoskeletal: He exhibits no edemaor tenderness.  Neurological:    Makes eye contact and can follow simple commands. Can answer his name, Lage    Moves LLE to pain provocation. LUE trace/5. Left inattention still present, 5/5 right upper extremity, is antigravity bilateral lower extremities, although difficult to do manual muscle testing secondary to aphasia and cognitive deficits  Psych: Aphasic, confused but pleasant  Assessment/Plan: 1. Functional deficits  secondary to right MCA infarct which require 3+ hours per day of interdisciplinary therapy in a comprehensive inpatient rehab setting. Physiatrist is providing close team supervision and 24 hour management of active medical problems listed below. Physiatrist and rehab team continue to assess barriers to discharge/monitor patient progress toward functional and medical  goals.  Function:  Bathing Bathing position Bathing activity did not occur: Refused (only consented to peri area) Position: Wheelchair/chair at sink  Bathing parts Body parts bathed by patient: Abdomen, Left arm, Chest, Right upper leg Body parts bathed by helper: Right arm, Front perineal area, Buttocks, Left upper leg, Right lower leg, Left lower leg  Bathing assist Assist Level: Touching or steadying assistance(Pt > 75%)      Upper Body Dressing/Undressing Upper body dressing Upper body dressing/undressing activity did not occur: Refused What is the patient wearing?: Pull over shirt/dress     Pull over shirt/dress - Perfomed by patient: Thread/unthread right sleeve, Thread/unthread left sleeve Pull over shirt/dress - Perfomed by helper: Put head through opening, Pull shirt over trunk        Upper body assist Assist Level: Touching or steadying assistance(Pt > 75%)      Lower Body Dressing/Undressing Lower body dressing   What is the patient wearing?: Pants, Socks, Shoes Underwear - Performed by patient: Thread/unthread right underwear leg Underwear - Performed by helper: Thread/unthread left underwear leg, Pull underwear up/down Pants- Performed by patient: Thread/unthread left pants leg, Thread/unthread right pants leg Pants- Performed by helper: Pull pants up/down     Socks - Performed by patient: Don/doff right sock, Don/doff left sock (able to doff both but required A to don) Socks - Performed by helper: Don/doff right sock, Don/doff left sock Shoes - Performed by patient: Don/doff right shoe, Don/doff left shoe Shoes - Performed by helper: Fasten right, Fasten left          Lower body assist Assist for lower body dressing: Touching or  steadying assistance (Pt > 75%)      Toileting Toileting Toileting activity did not occur: No continent bowel/bladder event   Toileting steps completed by helper: Adjust clothing prior to toileting, Performs perineal hygiene,  Adjust clothing after toileting Toileting Assistive Devices: Grab bar or rail  Toileting assist Assist level: Touching or steadying assistance (Pt.75%)   Transfers Chair/bed transfer   Chair/bed transfer method: Ambulatory Chair/bed transfer assist level: Maximal assist (Pt 25 - 49%/lift and lower) Chair/bed transfer assistive device: Armrests     Locomotion Ambulation     Max distance: 16ft  Assist level: Moderate assist (Pt 50 - 74%)   Wheelchair Wheelchair activity did not occur: Refused Type: Manual      Cognition Comprehension Comprehension assist level: Understands basic 25 - 49% of the time/ requires cueing 50 - 75% of the time  Expression Expression assist level: Expresses basis less than 25% of the time/requires cueing >75% of the time.  Social Interaction Social Interaction assist level: Interacts appropriately 25 - 49% of time - Needs frequent redirection.  Problem Solving Problem solving assist level: Solves basic less than 25% of the time - needs direction nearly all the time or does not effectively solve problems and may need a restraint for safety  Memory Memory assist level: Recognizes or recalls less than 25% of the time/requires cueing greater than 75% of the time   Medical Problem List and Plan: 1. Left side weaknesssecondary to right MCA infarct -continue PT, OT, SLP  - tentative discharge date 11/06/2016 .2. DVT Prophylaxis/Anticoagulation: Subcutaneous Lovenox. Monitor platelet counts and any signs of bleeding 3. Pain Management: Tylenol 4. Mood/cognition: Namenda 10 mg twice a day, Aricept 10 mg daily at bedtime -continue risperdal 0.5mg  at 8pm for sundowning-good results so far  -low bed  -restraints still required for patient safety  -ua neg, ucx with multispecies---will re-send for ucx 5. Neuropsych: This patient isnot capable of making decisions on hisown behalf. 6. Skin/Wound Care: 2 stage 2 areas  Sup aspect of gluteal  cleft and Left upper sacral area no drainage or odor- foam dressing 7. Fluids/Electrolytes/Nutrition: Routine I&O with follow-up chemistries reviewed - good meal intake 8.Right carotid stenosis. Follow-up vascular surgery. Not a surgical candidate at this time 9.Atrial flutter. Ablation 2013. Cardiac rate controlled -hgb normal , eliquis , was started on 10/27/2016 10. BPH. Avodart 0.5 mg daily.     LOS (Days) 7 A FACE TO FACE EVALUATION WAS PERFORMED  Charlett Blake, MD 10/28/2016 8:43 AM

## 2016-10-28 NOTE — Progress Notes (Signed)
Physical Therapy Session Note  Patient Details  Name: Kelly Lloyd MRN: [id number] Date of Birth: 1927-09-18  Today's Date: 10/28/2016  PT Individual Time: 1332-1430 AND 1630-1700 PT Individual Time Calculation (min): 58 min AND 30 min   Short Term Goals: Week 1:  PT Short Term Goal 1 (Week 1): Pt will perform sit<>stand with min assist consistently PT Short Term Goal 2 (Week 1): Pt will initate WC mobility  PT Short Term Goal 3 (Week 1): Pt will ambulate 13f with mod assist and LRAD  PT Short Term Goal 4 (Week 1): Pt will perform bed mobiltiy with min assist from PT.   Skilled Therapeutic Interventions/Progress Updates:   Pt received sitting in WC and agreeable to PT  WC mobility with BLE propulsion with supervision assist from PT x 1554f. Min cues from PT for improved awareness of obstacles on the L.   PT instructed pt in gait training in various environments including tile floor of rehab unit x 20011fmd 150f40fith min assist. Up/down 10ft6fp, min assist provided by PT, over uneven mulched surface x 12 ft and up/down 6 inch curb with Mod assist from PT and moderate cues for AD management. .   CMarland Kitchenr transfer training with RW and min assist from PT as well as moderatae cues for technique and safety throughout transfer.   Nustep reciprocal movement training x 10 mintues with LUE hand support, level 4-6. Pt noted to initiate  LUE reach to hand support with increased independence   Seated UE peg board. Pt able to maintain pattern with moderate questioning cues. Reaching with LUE x 8 with max cues for attention to the LUE and hand over hand assist to facilite wrist extension prior to grasp in order to improve functional handling of pegs.   Session 2.   Pt received sitting in WC and agreeable to PT  Gait training instructed by PT 180ft 64fwith min assist and mod cues for safety and awareness of situation.   LUE Fine motor control and awareness to grab and release tennis ball  successful volition grasp 25% of the time, successful drop 5% of the time. Pt noted to perform hshoulder flexion and lift ball when asked to release.   WC mobility instructed by PT with supervision assist from PT x 150ft u13f BLE for propulsion. Moderate cues for location of room in rehab unit.   Patient returned too room and [facility] and all needs met.            Therapy Documentation Precautions:  Precautions Precautions: Fall Precaution Comments: Dementia Restrictions Weight Bearing Restrictions: No General:   Vital Signs: Therapy Vitals Temp: 98.6 F (37 C) Temp Source: Oral Pulse Rate: 79 Resp: 17 BP: (!) 111/55 Patient Position (if appropriate): Sitting Oxygen Therapy SpO2: 99 % O2 Device: Not Delivered Pain:   Mobility:   Locomotion :    Trunk/Postural Assessment :    Balance:   Exercises:   Other Treatments:     See Function Navigator for Current Functional Status.   Therapy/Group: Individual Therapy  Paulette Lynch [name], 2:33 PM

## 2016-10-28 NOTE — Progress Notes (Signed)
Physical Therapy Session Note  Patient Details  Name: Kelly Lloyd MRN: [id number] Date of Birth: 09-12-1927  Today's Date: 10/27/2016 PT Individual Time:  1300-1400 60 min      Short Term Goals: Week 1:  PT Short Term Goal 1 (Week 1): Pt will perform sit<>stand with min assist consistently PT Short Term Goal 2 (Week 1): Pt will initate WC mobility  PT Short Term Goal 3 (Week 1): Pt will ambulate 131ft with mod assist and LRAD  PT Short Term Goal 4 (Week 1): Pt will perform bed mobiltiy with min assist from PT.   Skilled Therapeutic Interventions/Progress Updates:      Pt received sitting in WC and agreeable to PT.   PT transported pt to rehab gym with total assist in [facility] for time management.   Gait training with RW and LUE hand splint x 163ft, 152ft, 124ft and 28ft. PT provided min assist throughout with moderate verbal cues for safety and AD management. Pt demonstrated no LOB in turns on this day.  Nustep reciprocal movement pattern and endurance training x 10 minutes, level 4, with min cues throughout for decreased speed to reduce excessive fatigue.     PT instructed pt in stair training to ascend and descent 1 flight of stairs with min assist to ascend and mod assist to descend with 1 UE support on the R. Prolonged rest break between ascent and descent due to fatigue. Poor problem solving to descend stairs as well as decreased safety awareness.   Seated forced use of the LLE to hold 1LB weight and tap ball back to therapist. Moderate cues from PT throughout 4 bouts, 1 min each for increased attention to the LUE and improved grip on weight bar with the LUE. Improve success noted with increased time  PT instructed pt in [facility] mobility back to room with BLE propulsion technique. Supervision assist provided by PT with min cues for improved attention to the L and awareness of obstacles and door frames.   Pt left sitting in WC with QRB in place and family present.   Therapy  Documentation Precautions:  Precautions Precautions: Fall Precaution Comments: Dementia Restrictions Weight Bearing Restrictions: No Vital Signs: Therapy Vitals Temp: 98.9 F (37.2 C) Temp Source: Oral Pulse Rate: 78 Resp: 16 BP: 108/68 Patient Position (if appropriate): Lying Oxygen Therapy SpO2: 100 % O2 Device: Not Delivered Pain 0/10   See Function Navigator for Current Functional Status.   Therapy/Group: Individual Therapy  Lorie Phenix 10/28/2016, 7:56 AM

## 2016-10-28 NOTE — Patient Care Conference (Signed)
Inpatient RehabilitationTeam Conference and Plan of Care Update Date: 10/27/2016   Time: 10:55 AM    Patient Name: Kelly Lloyd      Medical Record Number: [id number]  Date of Birth: January 13, 1928 Sex: Male         Room/Bed: 4W15C/4W15C-01 Payor Info: Payor: [place] / Plan: BCBS MEDICARE / Product Type: *No Product type* /    Admitting Diagnosis: R CVA  Admit Date/Time:  10/21/2016  5:36 PM Admission Comments: No comment available   Primary Diagnosis:  <principal problem not specified> Principal Problem: <principal problem not specified>  Patient Active Problem List   Diagnosis Date Noted  . Right middle cerebral artery stroke ([place]) 10/21/2016  . Atrial flutter ([place])   . 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 Weight loss 10/18/2016  . Paroxysmal atrial fibrillation ([place]) 10/18/2016  . Status post catheter ablation of atrial fibrillation 10/18/2016  . Alzheimer's disease 10/18/2016  . Acute ischemic stroke (HCC) - R MCA scattered infarcts, unknown etiology 10/17/2016  . Anal stricture 04/17/2014  . Rectal pain 04/17/2014  . Unspecified constipation 08/30/2012  . Medicare annual wellness visit, initial 01/19/2012  . Medically noncompliant 01/19/2012  . Post-nasal drip 01/19/2012  . Hemorrhoid 06/24/2011  . PENILE LESION 02/12/2010  . 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 HYPERCHOLESTEROLEMIA 02/10/2010  . MEMORY LOSS 02/05/2009  . DIVERTICULOSIS, COLON 12/15/2006  . BENIGN PROSTATIC HYPERTROPHY 12/15/2006  . OSTEOARTHRITIS, GENERALIZED, MULTIPLE JOINTS 12/15/2006    Expected Discharge Date: Expected Discharge Date: 11/06/16  Team Members Present: Physician leading conference: Dr. Alysia Penna Social Worker Present: Ovidio Kin, LCSW Nurse Present: Dorthula Nettles, RN PT Present: Barrie Folk, PT;Rosita Dechalus, PTA OT Present: Napoleon Form, OT PPS Coordinator present : Daiva Nakayama, RN,  CRRN     Current Status/Progress Goal Weekly Team Focus  Medical   chronic cognitive deficits,  inc Bowel and bladder,   maintain skin integrity, medical stability  update family with care needs   Bowel/Bladder   Pt is incontinent of bowel and bladder  to have fewer incontinet episodes  offer toileting  episodes hourly while awake to decrease numer of inct.episodes.   Swallow/Nutrition/ Hydration             ADL's   Varies depending on time of day and attention/frustration level. Ranges mod- total A  Min A overall  ADL re-training; L neuro re-ed; safety; family education   Mobility   Pt  min assist   transfers, gait with RW, w/c mobilty   Communication             Safety/Cognition/ Behavioral Observations            Pain   denies         Skin   Stage II to sarcum- foam inplace. Bruising to extermites. skin tear to left elbow tegaderm inpalce.  free of fruther breakdown mod assist  assess skin and cahnge foam and tegaderm as needed      *See Care Plan and progress notes for long and short-term goals.     Barriers to Discharge  Current Status/Progress Possible Resolutions Date Resolved   Physician                      Nursing                    PT  OT                    SLP                  SW  CVA/Dementia-home support    safe discharge plan          Discharge Planning/Teaching Needs:  Unsure of discharge plan, wife and family awaiting progress in therapies to see what best option is for them.      Team Discussion:  Goals of min assist level-pt does flucuate due to cognition at the moment. Incont B & B-RN working on at times just caches in time. Stage 2 on sacrum. Very distractible, unsure if wife can provide min assist level at discharge. Family needs to come up with plan.  Revisions to Treatment Plan:  Discharge plan-ques if needs SNF    Continued Need for Acute Rehabilitation Level of Care: The patient requires daily medical management  by a physician with specialized training in physical medicine and rehabilitation for the following conditions: Daily direction of a multidisciplinary physical rehabilitation program to ensure safe treatment while eliciting the highest outcome that is of practical value to the patient.: Yes Daily medical management of patient stability for increased activity during participation in an intensive rehabilitation regime.: Yes Daily analysis of laboratory values and/or radiology reports with any subsequent need for medication adjustment of medical intervention for : Neurological problems;Other;Mood/behavior problems  [name] 10/28/2016, 2:14 PM

## 2016-10-29 ENCOUNTER — Inpatient Hospital Stay (HOSPITAL_COMMUNITY): Payer: Medicare Other | Admitting: Physical Therapy

## 2016-10-29 ENCOUNTER — Inpatient Hospital Stay (HOSPITAL_COMMUNITY): Payer: Medicare Other

## 2016-10-29 ENCOUNTER — Inpatient Hospital Stay (HOSPITAL_COMMUNITY): Payer: Medicare Other | Admitting: Occupational Therapy

## 2016-10-29 LAB — URINE CULTURE: CULTURE: NO GROWTH

## 2016-10-29 NOTE — Progress Notes (Signed)
Physical Therapy Session Note  Patient Details  Name: Kelly Lloyd MRN: [id number] Date of Birth: [birth year]  Today's Date: 10/29/2016 PT Individual Time: 1400-1430 PT Individual Time Calculation (min): 30 min    Skilled Therapeutic Interventions/Progress Updates: Pt received seated in w/c with family present; denies pain and initially agreeable to treatment however actively resisting against intervention when cued to participate or initiate mobility tasks. Attempted various methods to increase participation to initiate gait or w/c propulsion; pt actively resisting moving LEs onto legrests or allowing therapist to turn w/c to pull backwards to leave room. Ultimately used pt's wife to motivate pt to get up and leave room by having her walk out into the hallway. Pt ambulated >200' with RW and minA to maintain LUE on handle; pt became frustrated with hand splint so it was removed however unable to maintain hand on RW without assist. Upon return to room attempted to engage pt in sit>stand to allow RN to assess skin on bottom; pt refusing and becoming mildly agitated. Pt left to rest in recliner with quick release belt intact and family, RN present.      Therapy Documentation Precautions:  Precautions Precautions: Fall Precaution Comments: Dementia Restrictions Weight Bearing Restrictions: No   See Function Navigator for Current Functional Status.   Therapy/Group: Individual Therapy  Luberta Mutter 10/29/2016, 4:42 PM

## 2016-10-29 NOTE — Progress Notes (Signed)
Occupational Therapy Session Note  Patient Details  Name: Kelly Lloyd MRN: [id number] Date of Birth: 02/17/1928  Today's Date: 10/29/2016 OT Individual Time: 1030-1100 OT Individual Time Calculation (min): 30 min    Short Term Goals: Week 1:  OT Short Term Goal 1 (Week 1): Pt will sit to stand with min A in prep for clothing management OT Short Term Goal 1 - Progress (Week 1): Met OT Short Term Goal 2 (Week 1): Pt will locate 3/5 needed grooming items wiht min VC for scanning L on sink OT Short Term Goal 2 - Progress (Week 1): Met OT Short Term Goal 3 (Week 1): Pt will transfer to BSC/toilet with MOD A and LRAD OT Short Term Goal 3 - Progress (Week 1): Met OT Short Term Goal 4 (Week 1): Pt will don shirt with MOD A OT Short Term Goal 4 - Progress (Week 1): Met OT Short Term Goal 5 (Week 1): Pt will complete 2/3 components of LB dressing with touching A OT Short Term Goal 5 - Progress (Week 1): Progressing toward goal  Skilled Therapeutic Interventions/Progress Updates:    1:1. No pain reported. Pt brushes teeth with set up and increased time with VC to scan to L to locate items and HOH A to hold toothpaste in LUE. In kitchen pt sorts silverware with set up and Vc for initiation. Pt stands with CGA-supervision to weight bear on LUE to wipe down kitchen table and counter. Pt refuses HOH A to assist LUE to wash countertops. Exited session with pt seated in w/c, wife present, and QRB donned.   Therapy Documentation Precautions:  Precautions Precautions: Fall Precaution Comments: Dementia Restrictions Weight Bearing Restrictions: No  See Function Navigator for Current Functional Status.   Therapy/Group: Individual Therapy  Tonny Branch 10/29/2016, 12:20 PM

## 2016-10-29 NOTE — Progress Notes (Signed)
Physical Therapy Weekly Progress Note  Patient Details  Name: Kelly Lloyd MRN: [id number] Date of Birth: 1927/05/03  Beginning of progress report period: October 22, 2016 End of progress report period: October 29, 2016  Today's Date: 10/29/2016 PT Individual Time: 6283-1517 PT Individual Time Calculation (min): 57 min   Patient has met 4 of 4 short term goals.  Pt has demonstrated improved consistency with transfers, bed mobility, and gait. Pt consistently required min assist for gait and tranfers to improve anterior weight shift as well as increased awareness of the LUE. Pt consistently performs at Supervision level with WC mobility using BLE propulsion. Poor problem solving and decreased awareness of deficits secondary to baseline dementia prevent increased progress at this time.   Patient continues to demonstrate the following deficits muscle weakness, decreased cardiorespiratoy endurance, unbalanced muscle activation and decreased coordination, decreased attention to left and ideational apraxia, decreased initiation, decreased attention, decreased awareness, decreased problem solving, decreased safety awareness, decreased memory and delayed processing and decreased standing balance, decreased postural control, hemiplegia and decreased balance strategies and therefore will continue to benefit from skilled PT intervention to increase functional independence with mobility.  Patient progressing toward long term goals..  Continue plan of care.  PT Short Term Goals Week 1:  PT Short Term Goal 1 (Week 1): Pt will perform sit<>stand with min assist consistently PT Short Term Goal 1 - Progress (Week 1): Met PT Short Term Goal 2 (Week 1): Pt will initate WC mobility  PT Short Term Goal 2 - Progress (Week 1): Met PT Short Term Goal 3 (Week 1): Pt will ambulate 185f with mod assist and LRAD  PT Short Term Goal 3 - Progress (Week 1): Met PT Short Term Goal 4 (Week 1): Pt will perform bed mobiltiy with  min assist from PT.  PT Short Term Goal 4 - Progress (Week 1): Met Week 2:  PT Short Term Goal 1 (Week 2): STG=LTg due to ELOS  Skilled Therapeutic Interventions/Progress Updates:   Pt received sitting in WC and agreeable to PT  WC mobility 2x 1529fwith supervision assist from PT using BLE for propulsion. Pt require min cues for doorway management.   Dynamic gait training to weave through 2x 7 cones with RW and min assist from PT. Moderate cues for attention to task and imrpoved posture. Pt perform second bout without LUE hand splint and was able to maintain grasp on RW 75% of the time. Additional gait training for 2052f 2 without LUE hand support and improved grasp and attention to the L UE noted   Blocked practice Bed mobility including sit<>supine and rolling R and L with min-supervision assist from PT and min cues for safety with LUE positiongin.   Supine NMR:  SLR BLE  Bridges x 15 Clam shells with level 2 tband x 15 Reciprocal marches x 15  Max verbal cues to technique and proper speed of movement to improve neuromotor control.   Seated UE NMR to hit ball with 1lb bar weight 2 x 3 minutes.   Patient returned too room and left sitting in [facility] in reach and all needs met.        Therapy Documentation Precautions:  Precautions Precautions: Fall Precaution Comments: Dementia Restrictions Weight Bearing Restrictions: No   Pain: Pain Assessment Pain Assessment: Faces Faces Pain Scale: No hurt   See Function Navigator for Current Functional Status.  Therapy/Group: Individual Therapy  AusLorie Phenix13/2018, 9:50 AM

## 2016-10-29 NOTE — Progress Notes (Signed)
[facility] PHYSICAL MEDICINE & REHABILITATION     PROGRESS NOTE    Subjective/Complaints:  No agitation this am, mostly in pm  ROS: limited by cognition   Objective: Vital Signs: Blood pressure (!) 130/58, pulse 76, temperature 97.9 F (36.6 C), temperature source Oral, resp. rate 16, weight 80.1 kg (176 lb 8 oz), SpO2 99 %. No results found. No results for input(s): WBC, HGB, HCT, PLT in the last 72 hours.  Recent Labs  10/28/16 0457  CREATININE 1.17   CBG (last 3)  No results for input(s): GLUCAP in the last 72 hours.  Wt Readings from Last 3 Encounters:  10/27/16 80.1 kg (176 lb 8 oz)  10/17/16 80.7 kg (177 lb 14.6 oz)  10/17/16 63.2 kg (139 lb 5.3 oz)    Physical Exam:   Gen: restless.  HENT:  Head: Normocephalic.  Eyes: EOMare normal.  Neck: Normal range of motion. Neck supple. No thyromegalypresent.  Cardiovascular: RRR without murmur. No JVD    Respiratory: CTA Bilaterally without wheezes or rales. Normal effort   GI: Soft. Bowel sounds are normal. He exhibits no distension.  Skin. Warm and dry Musculoskeletal: He exhibits no edemaor tenderness. No pain with Left shoulder ROM Neurological:    Makes eye contact and can follow simple commands. Can answer his name, Be[place]ol    Moves LLE to pain provocation. LUE trace/5. Left inattention still present, 5/5 right upper extremity, is antigravity bilateral lower extremities, although difficult to do manual muscle testing secondary to aphasia and cognitive deficits  Psych: Aphasic, confused but pleasant  Assessment/Plan: 1. Functional deficits  secondary to right MCA infarct which require 3+ hours per day of interdisciplinary therapy in a comprehensive inpatient rehab setting. Physiatrist is providing close team supervision and 24 hour management of active medical problems listed below. Physiatrist and rehab team continue to assess barriers to discharge/monitor patient progress toward functional and medical  goals.  Function:  Bathing Bathing position Bathing activity did not occur: Refused (only consented to peri area) Position: Wheelchair/chair at sink  Bathing parts Body parts bathed by patient: Abdomen, Left arm, Chest, Right upper leg, Right arm, Left upper leg, Right lower leg, Left lower leg Body parts bathed by helper: Front perineal area, Buttocks, Back  Bathing assist Assist Level: Touching or steadying assistance(Pt > 75%)      Upper Body Dressing/Undressing Upper body dressing Upper body dressing/undressing activity did not occur: Refused What is the patient wearing?: Pull over shirt/dress     Pull over shirt/dress - Perfomed by patient: Put head through opening, Pull shirt over trunk Pull over shirt/dress - Perfomed by helper: Thread/unthread right sleeve, Thread/unthread left sleeve        Upper body assist Assist Level: Touching or steadying assistance(Pt > 75%)      Lower Body Dressing/Undressing Lower body dressing   What is the patient wearing?: Pants, Socks, Shoes Underwear - Performed by patient: Thread/unthread right underwear leg Underwear - Performed by helper: Thread/unthread left underwear leg, Pull underwear up/down Pants- Performed by patient: Pull pants up/down, Thread/unthread right pants leg Pants- Performed by helper: Thread/unthread left pants leg     Socks - Performed by patient: Don/doff right sock, Don/doff left sock Socks - Performed by helper: Don/doff right sock, Don/doff left sock Shoes - Performed by patient: Don/doff right shoe, Don/doff left shoe Shoes - Performed by helper: Fasten right, Fasten left          Lower body assist Assist for lower body dressing: Touching or steadying  assistance (Pt > 75%)      Toileting Toileting Toileting activity did not occur: No continent bowel/bladder event   Toileting steps completed by helper: Adjust clothing prior to toileting, Performs perineal hygiene, Adjust clothing after  toileting Toileting Assistive Devices: Grab bar or rail  Toileting assist Assist level: Two helpers   Transfers Chair/bed transfer   Chair/bed transfer method: Stand pivot Chair/bed transfer assist level: Touching or steadying assistance (Pt > 75%) Chair/bed transfer assistive device: Armrests, Medical sales representative     Max distance: 220ft  Assist level: Touching or steadying assistance (Pt > 75%)   Wheelchair Wheelchair activity did not occur: Refused Type: Manual Max wheelchair distance: 173ft  Assist Level: Supervision or verbal cues  Cognition Comprehension Comprehension assist level: Understands basic less than 25% of the time/ requires cueing >75% of the time  Expression Expression assist level: Expresses basis less than 25% of the time/requires cueing >75% of the time.  Social Interaction Social Interaction assist level: Interacts appropriately less than 25% of the time. May be withdrawn or combative.  Problem Solving Problem solving assist level: Solves basic less than 25% of the time - needs direction nearly all the time or does not effectively solve problems and may need a restraint for safety  Memory Memory assist level: Recognizes or recalls less than 25% of the time/requires cueing greater than 75% of the time   Medical Problem List and Plan: 1. Left side weaknesssecondary to right MCA infarct -continue PT, OT, SLP  - tentative discharge date 11/06/2016 .2. DVT Prophylaxis/Anticoagulation: Subcutaneous Lovenox. Monitor platelet counts and any signs of bleeding 3. Pain Management: Tylenol 4. Mood/cognition: Namenda 10 mg twice a day, Aricept 10 mg daily at bedtime -continue risperdal 0.5mg  at 8pm for sundowning-good results so far  -low bed  -restraints still required for patient safety  -ua neg, ucx with multispecies---will re-send for ucx 5. Neuropsych: This patient isnot capable of making decisions on hisown behalf. 6.  Skin/Wound Care: 2 stage 2 areas  Sup aspect of gluteal cleft and Left upper sacral area no drainage or odor- foam dressing 7. Fluids/Electrolytes/Nutrition: Routine I&O with follow-up chemistries reviewed - good meal intake 8.Right carotid stenosis. Follow-up vascular surgery. Not a surgical candidate at this time 9.Atrial flutter. Ablation 2013. Cardiac rate controlled -hgb normal , eliquis , was started on 10/27/2016, recheck in am 10. BPH. Avodart 0.5 mg daily.     LOS (Days) 8 A FACE TO FACE EVALUATION WAS PERFORMED  Charlett Blake, MD 10/29/2016 8:09 AM

## 2016-10-29 NOTE — Progress Notes (Signed)
Occupational Therapy Weekly Progress Note  Patient Details  Name: Kelly Lloyd MRN: [id number] Date of Birth: Aug 08, 1927  Beginning of progress report period: October 22, 2016 End of progress report period: October 29, 2016  Today's Date: 10/29/2016 OT Individual Time: 7628-3151 OT Individual Time Calculation (min): 75 min    Patient has met 3 of 4 short term goals.  Pt making slow but steady progress towards OT goals. Pt very inconsistent with performance, attention, and participation throughout the day. Dementia behaviors including sundowning and disorientation are very much limiting his functional performance and ability for gains. Pt only oriented to self and has no awareness of deficits related to CVA and therefore can become agitated when physical assist is provided by therapist for balance/ safety. [name] has been present during some sessions and is aware of pt's behaviors and high safety/ fall risk. She does not believe she will be able to handle him at home safely and therefore is pursing rehab facility following IPR. Pt will cont to benefit from OT services to address BADLs, neuro re-ed, and functional balance.   Patient continues to demonstrate the following deficits:abnormal posture, apraxia, cognitive deficits, disturbance of vision, hemiplegia affecting non-dominant side and muscle weakness (generalized) and therefore will continue to benefit from skilled OT intervention to enhance overall performance with BADL and Reduce care partner burden.  Patient progressing toward long term goals..  Continue plan of care.  OT Short Term Goals Week 1:  OT Short Term Goal 1 (Week 1): Pt will sit to stand with min A in prep for clothing management OT Short Term Goal 1 - Progress (Week 1): Met OT Short Term Goal 2 (Week 1): Pt will locate 3/5 needed grooming items wiht min VC for scanning L on sink OT Short Term Goal 2 - Progress (Week 1): Met OT Short Term Goal 3 (Week 1): Pt will transfer to  BSC/toilet with MOD A and LRAD OT Short Term Goal 3 - Progress (Week 1): Met OT Short Term Goal 4 (Week 1): Pt will don shirt with MOD A OT Short Term Goal 4 - Progress (Week 1): Met OT Short Term Goal 5 (Week 1): Pt will complete 2/3 components of LB dressing with touching A OT Short Term Goal 5 - Progress (Week 1): Progressing toward goal Week 2:  OT Short Term Goal 1 (Week 2): STG=LTG due to LOS  Skilled Therapeutic Interventions/Progress Updates:    Pt seen for OT session focusing on ADL re-training and L neuro re-ed. Pt sitting up in w/c asleep upon arrival, increased time to awaken however pleasant once awaken. Pt taken to therapy day room to eat breakfast. Required contextual cuing to initiate eating task once VCs for initiate did not work. Once scoop of eggs placed on fork pt began eating. He was able to locate all items on meal tray that were placed on L side of tray. He did not initiate use of L UE during task however. Pt attempting to eat plastic lid which covered coffee, however, able to be easily re-directed and was appopriate from that point forward.   Pt then utilized  BIONIK- Inmotion Arm control machine. He completed 160 L UE AAROM reps with assist from robot to be more accurate and controlled. He initiated 75% of the movements and during 2 trials improved accuracy and efficiency with closer half targets than far away. Due to cognitive deficits from dementia, decreased attention to task, and decreased ability to follow multimodal cuing pt had difficulty  attending and actively engaging in task. Pt returned to nurses station at end of session for supervision.   Therapy Documentation Precautions:  Precautions Precautions: Fall Precaution Comments: Dementia Restrictions Weight Bearing Restrictions: No Pain:   No/ denies pain  See Function Navigator for Current Functional Status.   Therapy/Group: Individual Therapy  Lewis, Andrian Urbach C 10/29/2016, 7:08 AM

## 2016-10-30 LAB — CBC
HCT: 39.4 % (ref 39.0–52.0)
HEMOGLOBIN: 12.8 g/dL — AB (ref 13.0–17.0)
MCH: 31.9 pg (ref 26.0–34.0)
MCHC: 32.5 g/dL (ref 30.0–36.0)
MCV: 98.3 fL (ref 78.0–100.0)
PLATELETS: 276 10*3/uL (ref 150–400)
RBC: 4.01 MIL/uL — AB (ref 4.22–5.81)
RDW: 13 % (ref 11.5–15.5)
WBC: 7.6 10*3/uL (ref 4.0–10.5)

## 2016-10-30 NOTE — Progress Notes (Signed)
At around 2 am, patient alarm went off staffs ran into the room and found patient kneeling on the mat in front of his bed and the rest of his body was still in bed.. Patient was assisted back into bed. Assessment done, no injury noted at the time. He denied pain or discomfort. V/s taken and were stable.  MD and family members notified. Patient slept the very good. Passed it in report to the on coming nurse. We continue to monitor.

## 2016-10-30 NOTE — Progress Notes (Signed)
Kelly Lloyd is a 83 y.o. male [birth year] [id number]  Subjective: Events last PM reviewed - no apparent injury - mild irritability and distraction, no acute distress  Objective: Vital signs in last 24 hours: Temp:  [97.4 F (36.3 C)-98.6 F (37 C)] 97.8 F (36.6 C) (07/14 0442) Pulse Rate:  [78-80] 80 (07/14 0442) Resp:  [18] 18 (07/14 0442) BP: (110-120)/(46-61) 110/60 (07/14 0442) SpO2:  [97 %-98 %] 98 % (07/14 0442) Weight change:  Last BM Date: 10/29/16  Intake/Output from previous day: 07/13 0701 - 07/14 0700 In: 360 [P.O.:360] Out: 300 [Urine:300]  Physical Exam General: No apparent distress   Looks at me but not verbally interactive Lungs: Normal effort. Lungs clear to auscultation, no crackles or wheezes. Cardiovascular: Regular rate and rhythm, no edema Neurological: No new neurological deficits; L HP   Lab Results: BMET    Component Value Date/Time   NA 137 10/22/2016 0741   NA 139 07/08/2011 1114   K 4.3 10/22/2016 0741   K 4.9 07/08/2011 1114   CL 105 10/22/2016 0741   CL 102 07/08/2011 1114   CO2 24 10/22/2016 0741   CO2 31 07/08/2011 1114   GLUCOSE 93 10/22/2016 0741   GLUCOSE 80 07/08/2011 1114   BUN 16 10/22/2016 0741   BUN 16 07/08/2011 1114   CREATININE 1.17 10/28/2016 0457   CREATININE 1.20 07/08/2011 1114   CALCIUM 9.2 10/22/2016 0741   CALCIUM 9.6 07/08/2011 1114   GFRNONAA 54 (L) 10/28/2016 0457   GFRNONAA >60 07/08/2011 1114   GFRAA >60 10/28/2016 0457   GFRAA >60 07/08/2011 1114   CBC    Component Value Date/Time   WBC 7.6 10/30/2016 0640   RBC 4.01 (L) 10/30/2016 0640   HGB 12.8 (L) 10/30/2016 0640   HGB 14.0 07/08/2011 1114   HCT 39.4 10/30/2016 0640   HCT 41.9 07/08/2011 1114   PLT 276 10/30/2016 0640   PLT 247 07/08/2011 1114   MCV 98.3 10/30/2016 0640   MCV 95 07/08/2011 1114   MCH 31.9 10/30/2016 0640   MCHC 32.5 10/30/2016 0640   RDW 13.0 10/30/2016 0640   RDW 13.2 07/08/2011 1114   LYMPHSABS 1.4 10/22/2016 0741    LYMPHSABS 1.3 07/08/2011 1114   MONOABS 0.8 10/22/2016 0741   MONOABS 0.9 (H) 07/08/2011 1114   EOSABS 0.3 10/22/2016 0741   EOSABS 0.3 07/08/2011 1114   BASOSABS 0.0 10/22/2016 0741   BASOSABS 0.0 07/08/2011 1114   CBG's (last 3):  No results for input(s): GLUCAP in the last 72 hours. LFT's Lab Results  Component Value Date   ALT 29 10/22/2016   AST 52 (H) 10/22/2016   ALKPHOS 105 10/22/2016   BILITOT 0.9 10/22/2016    Studies/Results: No results found.  Medications:  I have reviewed the patient's current medications. Scheduled Medications: . apixaban  5 mg Oral BID  . donepezil  10 mg Oral Daily  . dutasteride  0.5 mg Oral Daily  . feeding supplement (ENSURE ENLIVE)  237 mL Oral TID BM  . memantine  10 mg Oral BID  . 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 R MCA CVA with L HP - continue CIR therapies and support - monitor and manage med comorbidities 2. Dementia - now with increase cognitive irritability s/p CVA and acute hospitalization - continue homes meds and risperdone prn 3. AFlutter s/p RFA 2013 - continue anticoag as begun for #1  Length of stay, days: 9   Valerie A. Asa Lente, MD 10/30/2016, 10:51 AM

## 2016-10-31 DIAGNOSIS — F0280 Dementia in other diseases classified elsewhere without behavioral disturbance: Secondary | ICD-10-CM

## 2016-10-31 DIAGNOSIS — G309 Alzheimer's disease, unspecified: Secondary | ICD-10-CM

## 2016-10-31 DIAGNOSIS — I4892 Unspecified atrial flutter: Secondary | ICD-10-CM

## 2016-10-31 NOTE — Progress Notes (Signed)
Kelly Lloyd is a 83 y.o. male Dec 07, 1927 [id number]  Subjective: RN keeping pt close for visual contact on morning rounds - Pt very pleasant and not currently distracted  Objective: Vital signs in last 24 hours: Temp:  [97 F (36.1 C)-97.8 F (36.6 C)] 97.8 F (36.6 C) (07/15 0518) Pulse Rate:  [80-82] 80 (07/15 0518) Resp:  [18] 18 (07/15 0518) BP: (112-115)/(50-60) 115/60 (07/15 0518) SpO2:  [97 %-98 %] 98 % (07/15 0518) Weight change:  Last BM Date: 10/31/16  Intake/Output from previous day: 07/14 0701 - 07/15 0700 In: 150 [P.O.:150] Out: -   Physical Exam General: No apparent distress   Pleasant, in WC with waist belt Lungs: Normal effort. Lungs clear to auscultation, no crackles or wheezes. Cardiovascular: Regular rate and rhythm, no edema Neurological: No new neurological deficits; L HP   Lab Results: BMET    Component Value Date/Time   NA 137 10/22/2016 0741   NA 139 07/08/2011 1114   K 4.3 10/22/2016 0741   K 4.9 07/08/2011 1114   CL 105 10/22/2016 0741   CL 102 07/08/2011 1114   CO2 24 10/22/2016 0741   CO2 31 07/08/2011 1114   GLUCOSE 93 10/22/2016 0741   GLUCOSE 80 07/08/2011 1114   BUN 16 10/22/2016 0741   BUN 16 07/08/2011 1114   CREATININE 1.17 10/28/2016 0457   CREATININE 1.20 07/08/2011 1114   CALCIUM 9.2 10/22/2016 0741   CALCIUM 9.6 07/08/2011 1114   GFRNONAA 54 (L) 10/28/2016 0457   GFRNONAA >60 07/08/2011 1114   GFRAA >60 10/28/2016 0457   GFRAA >60 07/08/2011 1114   CBC    Component Value Date/Time   WBC 7.6 10/30/2016 0640   RBC 4.01 (L) 10/30/2016 0640   HGB 12.8 (L) 10/30/2016 0640   HGB 14.0 07/08/2011 1114   HCT 39.4 10/30/2016 0640   HCT 41.9 07/08/2011 1114   PLT 276 10/30/2016 0640   PLT 247 07/08/2011 1114   MCV 98.3 10/30/2016 0640   MCV 95 07/08/2011 1114   MCH 31.9 10/30/2016 0640   MCHC 32.5 10/30/2016 0640   RDW 13.0 10/30/2016 0640   RDW 13.2 07/08/2011 1114   LYMPHSABS 1.4 10/22/2016 0741   LYMPHSABS 1.3  07/08/2011 1114   MONOABS 0.8 10/22/2016 0741   MONOABS 0.9 (H) 07/08/2011 1114   EOSABS 0.3 10/22/2016 0741   EOSABS 0.3 07/08/2011 1114   BASOSABS 0.0 10/22/2016 0741   BASOSABS 0.0 07/08/2011 1114   CBG's (last 3):  No results for input(s): GLUCAP in the last 72 hours. LFT's Lab Results  Component Value Date   ALT 29 10/22/2016   AST 52 (H) 10/22/2016   ALKPHOS 105 10/22/2016   BILITOT 0.9 10/22/2016    Studies/Results: No results found.  Medications:  I have reviewed the patient's current medications. Scheduled Medications: . apixaban  5 mg Oral BID  . donepezil  10 mg Oral Daily  . dutasteride  0.5 mg Oral Daily  . feeding supplement (ENSURE ENLIVE)  237 mL Oral TID BM  . memantine  10 mg Oral BID  . risperiDONE  0.5 mg Oral Daily   PRN Medications: acetaminophen **OR** acetaminophen (TYLENOL) oral liquid 160 mg/5 mL **OR** acetaminophen, ondansetron **OR** ondansetron (ZOFRAN) IV, risperiDONE, sorbitol  Assessment/Plan: Principal Problem:   Right middle cerebral artery stroke (HCC) Active Problems:   HYPERCHOLESTEROLEMIA   Status post catheter ablation of atrial fibrillation   Alzheimer's disease   Atrial flutter (HCC)   Flaccid hemiplegia of left nondominant side as  late effect of cerebral infarction ([place])   1. R MCA CVA with L HP - continue CIR therapies and support - monitor and manage med comorbidities 2. Dementia - with increase cognitive irritability s/p CVA and acute hospitalization - continue homes meds and risperdone prn 3. AFlutter s/p RFA 2013 - continue anticoag as begun for #1  Length of stay, days: 10   Itxel Wickard A. Asa Lente, MD 10/31/2016, 9:12 AM

## 2016-11-01 ENCOUNTER — Inpatient Hospital Stay (HOSPITAL_COMMUNITY): Payer: Medicare Other | Admitting: Occupational Therapy

## 2016-11-01 ENCOUNTER — Inpatient Hospital Stay (HOSPITAL_COMMUNITY): Payer: Medicare Other | Admitting: Physical Therapy

## 2016-11-01 DIAGNOSIS — F0281 Dementia in other diseases classified elsewhere with behavioral disturbance: Secondary | ICD-10-CM

## 2016-11-01 DIAGNOSIS — G308 Other Alzheimer's disease: Secondary | ICD-10-CM

## 2016-11-01 NOTE — NC FL2 (Signed)
[place] MEDICAID FL2 LEVEL OF CARE SCREENING TOOL     IDENTIFICATION  Patient Name: Kelly Lloyd Birthdate: [birth year] Sex: male Admission Date (Current Location): 10/21/2016  [facility] [place] Number:  Engineering geologist and Address:  [facility], [place] [address] [place],  [address]      Provider Number: [id number]  Attending Physician Name and Address:  Charlett Blake, MD  Relative Name and Phone Number:  [name]-wife [phone]    Current Level of Care: Other (Comment) (rehab) Recommended Level of Care: Nursing Facility Prior Approval Number:    Date Approved/Denied:   PASRR Number: [id number]  Discharge Plan: SNF    Current Diagnoses: Patient Active Problem List   Diagnosis Date Noted  . Right middle cerebral artery stroke ([place]) 10/21/2016  . Atrial flutter ([place])   . Flaccid hemiplegia of left nondominant side as late effect of cerebral infarction ([place])   . Weight loss 10/18/2016  . Paroxysmal atrial fibrillation ([place]) 10/18/2016  . Status post catheter ablation of atrial fibrillation 10/18/2016  . Alzheimer's disease 10/18/2016  . Acute ischemic stroke (HCC) - R MCA scattered infarcts, unknown etiology 10/17/2016  . Anal stricture 04/17/2014  . Rectal pain 04/17/2014  . Unspecified constipation 08/30/2012  . Medicare annual wellness visit, initial 01/19/2012  . Medically noncompliant 01/19/2012  . Post-nasal drip 01/19/2012  . Hemorrhoid 06/24/2011  . PENILE LESION 02/12/2010  . 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 HYPERCHOLESTEROLEMIA 02/10/2010  . Memory loss 02/05/2009  . DIVERTICULOSIS, COLON 12/15/2006  . BENIGN PROSTATIC HYPERTROPHY 12/15/2006  . OSTEOARTHRITIS, GENERALIZED, MULTIPLE JOINTS 12/15/2006    Orientation RESPIRATION BLADDER Height & Weight     Self, Place  Normal Incontinent (Timed tolieting-every 2-3 hours) Weight: 176 lb 8 oz (80.1 kg) Height:     BEHAVIORAL SYMPTOMS/MOOD  NEUROLOGICAL BOWEL NUTRITION STATUS      Continent Diet (Regular diet)  AMBULATORY STATUS COMMUNICATION OF NEEDS Skin   Limited Assist Non-Verbally PU Stage and Appropriate Care                       Personal Care Assistance Level of Assistance  Bathing, Dressing Bathing Assistance: Limited assistance   Dressing Assistance: Limited assistance     Functional Limitations Info  Speech          SPECIAL CARE FACTORS FREQUENCY  PT (By licensed PT), OT (By licensed OT), Bowel and bladder program     PT Frequency: 5x week OT Frequency: 5x week Bowel and Bladder Program Frequency: Timed tolieting program every 2 hours toliet patient          Contractures Contractures Info: Not present    Additional Factors Info  Code Status Code Status Info: Full Code             Current Medications (11/01/2016):  This is the current hospital active medication list Current Facility-Administered Medications  Medication Dose Route Frequency Provider Last Rate Last Dose  . acetaminophen (TYLENOL) tablet 650 mg  650 mg Oral Q4H PRN Angiulli, Lavon Paganini, PA-C       Or  . acetaminophen (TYLENOL) solution 650 mg  650 mg Per Tube Q4H PRN Angiulli, Lavon Paganini, PA-C       Or  . acetaminophen (TYLENOL) suppository 650 mg  650 mg Rectal Q4H PRN Angiulli, Lavon Paganini, PA-C      . apixaban (ELIQUIS) tablet 5 mg  5 mg Oral BID Angiulli, Lavon Paganini, PA-C   5 mg  at 11/01/16 6546  . donepezil (ARICEPT) tablet 10 mg  10 mg Oral Daily Kirsteins, Luanna Salk, MD   10 mg at 11/01/16 5035  . dutasteride (AVODART) capsule 0.5 mg  0.5 mg Oral Daily Cathlyn Parsons, PA-C   0.5 mg at 10/31/16 2019  . feeding supplement (ENSURE ENLIVE) (ENSURE ENLIVE) liquid 237 mL  237 mL Oral TID BM AngiulliLavon Paganini, PA-C   237 mL at 11/01/16 4656  . memantine (NAMENDA) tablet 10 mg  10 mg Oral BID Cathlyn Parsons, PA-C   10 mg at 11/01/16 8127  . ondansetron (ZOFRAN) tablet 4 mg  4 mg Oral Q6H PRN Angiulli, Lavon Paganini, PA-C        Or  . ondansetron [facility]) injection 4 mg  4 mg Intravenous Q6H PRN Angiulli, Lavon Paganini, PA-C      . risperiDONE (RISPERDAL) tablet 0.25 mg  0.25 mg Oral QHS PRN Meredith Staggers, MD   0.25 mg at 10/29/16 0100  . risperiDONE (RISPERDAL) tablet 0.5 mg  0.5 mg Oral Daily Alger Simons T, MD   0.5 mg at 10/31/16 1917  . sorbitol 70 % solution 30 mL  30 mL Oral Daily PRN Cathlyn Parsons, PA-C         Discharge Medications: Please see discharge summary for a list of discharge medications.  Relevant Imaging Results:  Relevant Lab Results:   Additional Information Pt does have dementia but is making progress in therapies from his stroke  Vieno Tarrant, Gardiner Rhyme, LCSW

## 2016-11-01 NOTE — Progress Notes (Signed)
Physical Therapy Session Note  Patient Details  Name: Kelly Lloyd MRN: [id number] Date of Birth: 1927/09/27  Today's Date: 11/01/2016 PT Individual Time: 1105-1205 PT Individual Time Calculation (min): 60 min   Short Term Goals: Week 2:  PT Short Term Goal 1 (Week 2): STG=LTg due to ELOS  Skilled Therapeutic Interventions/Progress Updates: Pt presented in w/c with wife present. W/C propulsion throughout unit x 282ft with min cues for direction. Gait training 184ft x 2 with RW with seated rest in rocking recliner.Pt not resistant to using hand splint however hand out of splint at end of ambulation.  Pt required minA for sit to stand from recliner due to soft surface and chair movement. NuStep L4 x 15 min for reciprocal activity. Pt demonstrating increasing awareness of LUE independently placing LUE on arm bar when dropping off hand bar. Performed ambulatory transfer with HHA to mat from NuStep as pt becoming agitated when encouraging use of RW. Pt verbalizing need for urinary void. Upon standing pt again resistant to use of RW and then attempting to turn RW around or try to use sideways. PTA eventually able to have pt use RW correctly and pt maintained LUE in orthotic during gait to pt's room. Pt ambulated to toilet and noted increased use of LUE when attempting to doff pants. Pt with + void/BM, performed successful sit to stand with use of wall rail and increased use of LUE to pull up pants. Performed gait with HHA from toilet to w/c (approx 60ft) with modA. Performed hand hygiene in w/c with minA and session ended with QRB in place and lunch in front of pt with wife present.      Therapy Documentation Precautions:  Precautions Precautions: Fall Precaution Comments: Dementia Restrictions Weight Bearing Restrictions: No General:   Vital Signs: Therapy Vitals Temp: 97.7 F (36.5 C) Temp Source: Oral Pulse Rate: 80 Resp: 18 BP: (!) 107/43 Patient Position (if appropriate):  Sitting Oxygen Therapy SpO2: 98 % O2 Device: Not Delivered Pain: Pain Assessment Pain Assessment: No/denies pain Pain Score: 0-No pain Mobility:   Locomotion :    Trunk/Postural Assessment :    Balance:   Exercises:   Other Treatments:     See Function Navigator for Current Functional Status.   Therapy/Group: Individual Therapy  Jayke Caul  Harwood Nall, PTA  11/01/2016, 12:14 PM

## 2016-11-01 NOTE — Progress Notes (Signed)
Occupational Therapy Session Note  Patient Details  Name: Kelly Lloyd MRN: [id number] Date of Birth: 1927-08-17  Today's Date: 11/01/2016 OT Individual Time: 1300-1345 OT Individual Time Calculation (min): 45 min    Short Term Goals: Week 2:  OT Short Term Goal 1 (Week 2): STG=LTG due to LOS  Skilled Therapeutic Interventions/Progress Updates:    Treatment session focused on ADL/self care training, transfer training, pt/caregiver education, neuromuscular reeducation, dynamic/static standing/seated trunk control, and safety awareness.  Upon entering room, pt entering bathroom with CNA. Therapist take over toileting task for skilled intervention training for pt. Pt provided with tactile and verbal cues during toilet transfer using grab bar. Pt demo'ed weakness to L side UE. Pt required min A for toilet transfer and was unable to complete successful BM. Pt required therapist assistance for perineal clean up prior to transferring to w/c. Pt was prompted at sink to stand and wash hands. Pt required [facility] for washing L hand and for L handed awareness. Pt prompted to wash sink with L hand with HOH for weight bearing and AAROM for neuro reeducation. Pt completed w/c to bed transfer with mod A and tolerated upright sitting EOB with close guard assistance for dynamic and static sitting task. Therapist provided several cues for attention to L side. Pt given shake to drink from nursing and practiced self feeding skills sitting EOB with CGA support. Pt transferred from EOB to w/c with mod A and verbal prompts for hand/foot placement. No pain noted and family was present. Pt was left with safety belt on and call bell within reach.   Therapy Documentation Precautions:  Precautions Precautions: Fall Precaution Comments: Dementia Restrictions Weight Bearing Restrictions: No    Vital Signs: Therapy Vitals Temp: 98.4 F (36.9 C) Temp Source: Oral Pulse Rate: 83 Resp: 18 BP: (!) 109/53 Patient  Position (if appropriate): Sitting Oxygen Therapy SpO2: 98 % O2 Device: Not Delivered Pain: Pain Assessment Pain Assessment: No/denies pain  See Function Navigator for Current Functional Status.   Therapy/Group: Individual Therapy  Delon Sacramento 11/01/2016, 3:31 PM

## 2016-11-01 NOTE — Progress Notes (Signed)
[facility] PHYSICAL [facility]ICINE & REHABILITATION     PROGRESS NOTE    Subjective/Complaints:  Sitting up in wheelchair looking at pictures of his family. Cannot name members.  ROS: limited by cognition   Objective: Vital Signs: Blood pressure (!) 109/53, pulse 83, temperature 98.4 F (36.9 C), temperature source Oral, resp. rate 18, weight 80.1 kg (176 lb 8 oz), SpO2 98 %. No results found.  Recent Labs  10/30/16 0640  WBC 7.6  HGB 12.8*  HCT 39.4  PLT 276   No results for input(s): NA, K, CL, GLUCOSE, BUN, CREATININE, CALCIUM in the last 72 hours.  Invalid input(s): CO CBG (last 3)  No results for input(s): GLUCAP in the last 72 hours.  Wt Readings from Last 3 Encounters:  10/27/16 80.1 kg (176 lb 8 oz)  10/17/16 80.7 kg (177 lb 14.6 oz)  10/17/16 63.2 kg (139 lb 5.3 oz)    Physical Exam:   Gen: restless.  HENT:  Head: Normocephalic.  Eyes: EOMare normal.  Neck: Normal range of motion. Neck supple. No thyromegalypresent.  Cardiovascular: RRR without murmur. No JVD    Respiratory: CTA Bilaterally without wheezes or rales. Normal effort   GI: Soft. Bowel sounds are normal. He exhibits no distension.  Skin. Warm and dry Musculoskeletal: He exhibits no edemaor tenderness. No pain with Left shoulder ROM Neurological:    Makes eye contact and can follow simple commands. Can answer his name, Juste  Difficult to perform muscle testing, does have left upper extremity weakness but able to move antigravity spontaneously  Psych: Aphasic, confused but pleasant  Assessment/Plan: 1. Functional deficits  secondary to right MCA infarct which require 3+ hours per day of interdisciplinary therapy in a comprehensive inpatient rehab setting. Physiatrist is providing close team supervision and 24 hour management of active medical problems listed below. Physiatrist and rehab team continue to assess barriers to discharge/monitor patient progress toward functional and medical  goals.  Function:  Bathing Bathing position Bathing activity did not occur: Refused (only consented to peri area) Position: Wheelchair/chair at sink  Bathing parts Body parts bathed by patient: Abdomen, Left arm, Chest, Right upper leg, Right arm, Left upper leg, Right lower leg, Left lower leg Body parts bathed by helper: Front perineal area, Buttocks, Back  Bathing assist Assist Level: Touching or steadying assistance(Pt > 75%)      Upper Body Dressing/Undressing Upper body dressing Upper body dressing/undressing activity did not occur: Refused What is the patient wearing?: Pull over shirt/dress     Pull over shirt/dress - Perfomed by patient: Put head through opening, Pull shirt over trunk Pull over shirt/dress - Perfomed by helper: Thread/unthread right sleeve, Thread/unthread left sleeve        Upper body assist Assist Level: Touching or steadying assistance(Pt > 75%)      Lower Body Dressing/Undressing Lower body dressing   What is the patient wearing?: Pants, Socks, Shoes Underwear - Performed by patient: Thread/unthread right underwear leg Underwear - Performed by helper: Thread/unthread left underwear leg, Pull underwear up/down Pants- Performed by patient: Pull pants up/down, Thread/unthread right pants leg Pants- Performed by helper: Thread/unthread left pants leg     Socks - Performed by patient: Don/doff right sock, Don/doff left sock Socks - Performed by helper: Don/doff right sock, Don/doff left sock Shoes - Performed by patient: Don/doff right shoe, Don/doff left shoe Shoes - Performed by helper: Fasten right, Fasten left          Lower body assist Assist for lower body  dressing: Touching or steadying assistance (Pt > 75%)      Toileting Toileting Toileting activity did not occur: No continent bowel/bladder event Toileting steps completed by patient: Adjust clothing prior to toileting, Adjust clothing after toileting Toileting steps completed by helper:  Performs perineal hygiene Toileting Assistive Devices: Grab bar or rail  Toileting assist Assist level: Touching or steadying assistance (Pt.75%)   Transfers Chair/bed transfer   Chair/bed transfer method: Stand pivot Chair/bed transfer assist level: Touching or steadying assistance (Pt > 75%) Chair/bed transfer assistive device: Armrests, Medical sales representative     Max distance: 23ft  Assist level: Touching or steadying assistance (Pt > 75%)   Wheelchair Wheelchair activity did not occur: Refused Type: Manual Max wheelchair distance: 146ft  Assist Level: Supervision or verbal cues  Cognition Comprehension Comprehension assist level: Understands basic 25 - 49% of the time/ requires cueing 50 - 75% of the time  Expression Expression assist level: Expresses basic 25 - 49% of the time/requires cueing 50 - 75% of the time. Uses single words/gestures.  Social Interaction Social Interaction assist level: Interacts appropriately 50 - 74% of the time - May be physically or verbally inappropriate.  Problem Solving Problem solving assist level: Solves basic less than 25% of the time - needs direction nearly all the time or does not effectively solve problems and may need a restraint for safety  Memory Memory assist level: Recognizes or recalls less than 25% of the time/requires cueing greater than 75% of the time   Medical Problem List and Plan: 1. Left side weaknesssecondary to right MCA infarct -continue PT, OT, SLP  - tentative discharge date 11/06/2016 .2. DVT Prophylaxis/Anticoagulation: Subcutaneous Lovenox. Monitor platelet counts and any signs of bleeding 3. Pain Management: Tylenol 4. Mood/cognition: Namenda 10 mg twice a day, Aricept 10 mg daily at bedtime -continue risperdal 0.5mg  at 8pm for sundowning-good results so far  -low bed  -restraints still required for patient safety  -ua neg, ucx with multispecies---will re-send for ucx 5.  Neuropsych: This patient isnot capable of making decisions on hisown behalf. 6. Skin/Wound Care: 2 stage 2 areas  Sup aspect of gluteal cleft and Left upper sacral area no drainage or odor- foam dressing 7. Fluids/Electrolytes/Nutrition: Routine I&O with follow-up chemistries reviewed - good meal intake 8.Right carotid stenosis. Follow-up vascular surgery. Not a surgical candidate at this time 9.Atrial flutter. Ablation 2013. Cardiac rate controlled -hgb normal , eliquis , was started on 10/27/2016, hemoglobin stable at 12.8 on 10/30/2016 10. BPH. Avodart 0.5 mg daily.     LOS (Days) 11 A FACE TO FACE EVALUATION WAS PERFORMED  Charlett Blake, MD 11/01/2016 4:31 PM

## 2016-11-01 NOTE — Progress Notes (Signed)
Notified by another nurse that patient had fell. Assessment performed. Patient does not appear to have any injuries. Patient denies any pain. Vitals signs were taken. Doctor notified. No new orders given. Family notified.

## 2016-11-01 NOTE — Progress Notes (Signed)
Occupational Therapy Session Note  Patient Details  Name: Kelly Lloyd MRN: [id number] Date of Birth: 22-Aug-1927  Today's Date: 11/01/2016  OT Individual Time: 6606-3016 and 1400-1430 OT Individual Time Calculation (min): 60 min and 30 min   Short Term Goals: Week 2:  OT Short Term Goal 1 (Week 2): STG=LTG due to LOS  Skilled Therapeutic Interventions/Progress Updates:    Session One: Pt seen for OT session focusing on ADL re-training, attention to task, and use of L UE. Pt sitting up in w/c upon arrival with RN, wife, and son present. Pt pleasant and agreeable to tx session. He declined bathing this morning and wife reported pt being in clean clothes. He completed oral care at sink, voluntarily using L UE to turn on water control, demonstrating much improved, awareness, initiation, ROM, and strength to use L UE in functional manner. Required cues for sequencing of task to use toothbrush, and completed remainder of task with supervision. In therapy gym, stood to complete laundry folding activity, pt ultimately sat to complete task, however, initiated use of L UE into task though due to poor coordination and decreased grasp pt had difficulty using it at functional level.  He self propelled w/c throughout unit using B LEs with increased time and supervision. He returned to room and left seated in w/c with wife and son present, QRB donned.   Session Two: Pt seen for OT session focusing on ADL re-training, attention to task, problem solving and L UE use. Pt received with hand off from NT with pt on toilet. Pt required significantly increased time and cuing to problem solve use of toilet paper for hygiene and how to dispose of toilet paper following successful hygiene.  He returned to sink and with environmental cuing pt able to wash hands, attending bi-laterally.  He declined ambulation this session and taken to [facility] gym total A in w/c. Completed pipe tree activity in quiet environment, unable to  initiate task following VCs, however, when presented with pieces one at a time he was able to attend and complete task with mod cuing for correct placement on very simple design. Required max-total cuing for moderately complex design. Pt able to use L UE spontaneously during session to assist at gross stabilizer level. Pt returned to room and with assist for safety and awareness transferred to recliner. Left seatd with QRB donned and family present.   Therapy Documentation Precautions:  Precautions Precautions: Fall Precaution Comments: Dementia Restrictions Weight Bearing Restrictions: No Pain:   No/ denies pain  See Function Navigator for Current Functional Status.   Therapy/Group: Individual Therapy  Lewis, Jeniah Kishi C 11/01/2016, 7:15 AM

## 2016-11-02 ENCOUNTER — Encounter (HOSPITAL_COMMUNITY): Payer: Self-pay

## 2016-11-02 ENCOUNTER — Inpatient Hospital Stay (HOSPITAL_COMMUNITY): Payer: Medicare Other | Admitting: Physical Therapy

## 2016-11-02 ENCOUNTER — Inpatient Hospital Stay (HOSPITAL_COMMUNITY): Payer: Medicare Other | Admitting: Occupational Therapy

## 2016-11-02 DIAGNOSIS — I69354 Hemiplegia and hemiparesis following cerebral infarction affecting left non-dominant side: Principal | ICD-10-CM

## 2016-11-02 MED ORDER — TUBERCULIN PPD 5 UNIT/0.1ML ID SOLN
5.0000 [IU] | Freq: Once | INTRADERMAL | Status: AC
  Administered 2016-11-02: 5 [IU] via INTRADERMAL
  Filled 2016-11-02: qty 0.1

## 2016-11-02 NOTE — Progress Notes (Addendum)
Social Work Patient ID: Kelly Lloyd, male   DOB: [birth year], 83 y.o.   MRN: [id number]  Met with wife to discuss plans. She informed worker they have a place at [facility] and assisted living with memory care In [place]. They have a bed for pt, but paperwork needs to be completed. Informed her [facility] has turned him down for a bed. Wife feels this is the best place for him and is a long term plan. Will see what Paperwork needs to be completed and work on discharge plan. Have asked [facility] to order a tb test for pt, since he will need this to go there. Checking to make sure can do the amount of care pt needs.

## 2016-11-02 NOTE — Progress Notes (Signed)
Physical Therapy Session Note  Patient Details  Name: Kelly Lloyd MRN: [id number] Date of Birth: June 18, 1927  Today's Date: 11/02/2016 PT Individual Time: 1100-1200 PT Individual Time Calculation (min): 60 min   Short Term Goals: Week 2:  PT Short Term Goal 1 (Week 2): STG=LTg due to ELOS  Skilled Therapeutic Interventions/Progress Updates: Pt presented in recliner with granddaughter present. Pt indicating need to use toilet. Pt ambulated to toilet with RW and doffed brief/pants with use of BUE (+bm/incontinent). Pt sit to stand min guard with increased use of LUE, and more independent use of LUE to pull up pants. HHA ambulation to sink with pt maintaining good(-)balance while washing hands. HHA ambulation to recliner as pt attempted to pick up objects off floor initially with RUE then attempting with LUE. Pt transported to rehab gym for time management. Performed standing balance activities no AD cleaning mirrors and sorting objects, encouraging use of LUE. Attempted picking up objects off ground however pt resistant to perform it in standing. Ambulated throughout unit via HHA >247ft with min guard, cues to increase erect posture and widen BOS with limited carryover. Pt ambulated back to room and returned to recliner with QRB placed and granddaughter and wife present.     Therapy Documentation Precautions:  Precautions Precautions: Fall Precaution Comments: Dementia Restrictions Weight Bearing Restrictions: No General:   Vital Signs:  Pain: Pain Assessment Pain Assessment: No/denies pain  See Function Navigator for Current Functional Status.   Therapy/Group: Individual Therapy  Deonta Bomberger  Jacqeline Broers, PTA  11/02/2016, 12:11 PM

## 2016-11-02 NOTE — Progress Notes (Signed)
Nutrition Follow-up  DOCUMENTATION CODES:   Severe malnutrition in context of chronic illness  INTERVENTION:  Continue Ensure Enlive po TID, each supplement provides 350 kcal and 20 grams of protein.  Encourage adequate PO intake.   NUTRITION DIAGNOSIS:   Malnutrition (severe) related to chronic illness (dementia) as evidenced by severe depletion of body fat, severe depletion of muscle mass; ongoing  GOAL:   Patient will meet greater than or equal to 90% of their needs; met  MONITOR:   PO intake, Supplement acceptance, Labs, Weight trends, Skin, I & O's  REASON FOR ASSESSMENT:   Malnutrition Screening Tool    ASSESSMENT:   83 y.o. right handed male with history of atrial flutter, dementia maintained on Namenda.  Presented 10/17/2016 after being found down with left-sided weakness. MRI reviewed, showing multiple right infarcts. scattered acute infarcts in the right MCA territory most confluent about the upper central sulcus where there was petechial hemorrhage.   Meal completion has been varied from 25-100%, with most intake usually 100%. Pt currently has Ensure ordered and has been consuming them. RD to continue with current orders to aid in caloric and protein needs.   Diet Order:  Diet regular Room service appropriate? Yes; Fluid consistency: Thin  Skin:  Wound (see comment) (Stage II to buttocks and sacrum)  Last BM:  7/17  Height:   Ht Readings from Last 1 Encounters:  10/17/16 6' (1.829 m)    Weight:   Wt Readings from Last 1 Encounters:  10/27/16 176 lb 8 oz (80.1 kg)    Ideal Body Weight:  80.9 kg  BMI:  Body mass index is 23.94 kg/m.  Estimated Nutritional Needs:   Kcal:  1700-1900  Protein:  90-100 grams  Fluid:  1.7 - 1.9 L/day  EDUCATION NEEDS:   No education needs identified at this time  Corrin Parker, MS, RD, LDN Pager # [phone] After hours/ weekend pager # [phone]

## 2016-11-02 NOTE — Progress Notes (Signed)
[facility] PHYSICAL MEDICINE & REHABILITATION     PROGRESS NOTE    Subjective/Complaints:  Patient is friendly and cooperative. Eating breakfast. Nursing notes no issues overnight. Other than he was up at the nurses desk in a recliner but rested well without evidence of agitation.  ROS: limited by cognition   Objective: Vital Signs: Blood pressure (!) 123/55, pulse 82, temperature 98.1 F (36.7 C), temperature source Oral, resp. rate 17, weight 80.1 kg (176 lb 8 oz), SpO2 97 %. No results found. No results for input(s): WBC, HGB, HCT, PLT in the last 72 hours. No results for input(s): NA, K, CL, GLUCOSE, BUN, CREATININE, CALCIUM in the last 72 hours.  Invalid input(s): CO CBG (last 3)  No results for input(s): GLUCAP in the last 72 hours.  Wt Readings from Last 3 Encounters:  10/27/16 80.1 kg (176 lb 8 oz)  10/17/16 80.7 kg (177 lb 14.6 oz)  10/17/16 63.2 kg (139 lb 5.3 oz)    Physical Exam:   Gen: restless.  HENT:  Head: Normocephalic.  Eyes: EOMare normal.  Neck: Normal range of motion. Neck supple. No thyromegalypresent.  Cardiovascular: RRR without murmur. No JVD    Respiratory: CTA Bilaterally without wheezes or rales. Normal effort   GI: Soft. Bowel sounds are normal. He exhibits no distension.  Skin. Warm and dry Musculoskeletal: He exhibits no edemaor tenderness. No pain with Left shoulder ROM Neurological:    Makes eye contact and can follow simple commands. Can answer his name, Kelly Lloyd  Difficult to perform muscle testing, does have left upper extremity weakness but able to move antigravity spontaneously  Psych: Aphasic, confused but pleasant  Assessment/Plan: 1. Functional deficits  secondary to right MCA infarct which require 3+ hours per day of interdisciplinary therapy in a comprehensive inpatient rehab setting. Physiatrist is providing close team supervision and 24 hour management of active medical problems listed below. Physiatrist and rehab team  continue to assess barriers to discharge/monitor patient progress toward functional and medical goals.  Function:  Bathing Bathing position Bathing activity did not occur: Refused (only consented to peri area) Position: Wheelchair/chair at sink  Bathing parts Body parts bathed by patient: Chest, Abdomen, Right arm, Left arm (UB only, pt refused LB) Body parts bathed by helper: Back  Bathing assist Assist Level: Touching or steadying assistance(Pt > 75%)      Upper Body Dressing/Undressing Upper body dressing Upper body dressing/undressing activity did not occur: Refused What is the patient wearing?: Pull over shirt/dress     Pull over shirt/dress - Perfomed by patient: Thread/unthread right sleeve, Pull shirt over trunk Pull over shirt/dress - Perfomed by helper: Put head through opening, Thread/unthread left sleeve        Upper body assist Assist Level: Touching or steadying assistance(Pt > 75%)      Lower Body Dressing/Undressing Lower body dressing   What is the patient wearing?: Pants, Socks, Shoes Underwear - Performed by patient: Thread/unthread right underwear leg Underwear - Performed by helper: Thread/unthread left underwear leg, Pull underwear up/down Pants- Performed by patient: Pull pants up/down, Thread/unthread right pants leg Pants- Performed by helper: Thread/unthread left pants leg     Socks - Performed by patient: Don/doff right sock, Don/doff left sock Socks - Performed by helper: Don/doff right sock, Don/doff left sock Shoes - Performed by patient: Don/doff right shoe, Don/doff left shoe Shoes - Performed by helper: Fasten right, Fasten left          Lower body assist Assist for lower body  dressing: Touching or steadying assistance (Pt > 75%)      Toileting Toileting Toileting activity did not occur: No continent bowel/bladder event Toileting steps completed by patient: Adjust clothing prior to toileting Toileting steps completed by helper: Performs  perineal hygiene, Adjust clothing after toileting Toileting Assistive Devices: Grab bar or rail  Toileting assist Assist level: Touching or steadying assistance (Pt.75%)   Transfers Chair/bed transfer   Chair/bed transfer method: Ambulatory Chair/bed transfer assist level: Touching or steadying assistance (Pt > 75%) Chair/bed transfer assistive device: Armrests     Locomotion Ambulation     Max distance: 236ft Assist level: Touching or steadying assistance (Pt > 75%)   Wheelchair Wheelchair activity did not occur: Refused Type: Manual Max wheelchair distance: 170ft  Assist Level: Supervision or verbal cues  Cognition Comprehension Comprehension assist level: Understands basic 25 - 49% of the time/ requires cueing 50 - 75% of the time  Expression Expression assist level: Expresses basic 25 - 49% of the time/requires cueing 50 - 75% of the time. Uses single words/gestures.  Social Interaction Social Interaction assist level: Interacts appropriately 50 - 74% of the time - May be physically or verbally inappropriate.  Problem Solving Problem solving assist level: Solves basic less than 25% of the time - needs direction nearly all the time or does not effectively solve problems and may need a restraint for safety  Memory Memory assist level: Recognizes or recalls less than 25% of the time/requires cueing greater than 75% of the time   Medical Problem List and Plan: 1. Left side weaknesssecondary to right MCA infarct -continue PT, OT, SLP, Team conference in a.m.  - tentative discharge date 11/06/2016 .2. DVT Prophylaxis/Anticoagulation: Subcutaneous Lovenox. Monitor platelet counts and any signs of bleeding 3. Pain Management: Tylenol 4. Mood/cognition: Namenda 10 mg twice a day, Aricept 10 mg daily at bedtime -continue risperdal 0.5mg  at 8pm for sundowning-good results so far  -low bed  -restraints still required for patient safety  -ua neg, ucx with  multispecies---will re-send for ucx 5. Neuropsych: This patient isnot capable of making decisions on hisown behalf. 6. Skin/Wound Care: 2 stage 2 areas  Sup aspect of gluteal cleft and Left upper sacral area no drainage or odor- foam dressing 7. Fluids/Electrolytes/Nutrition: Routine I&O with follow-up chemistries reviewed - good meal intake 8.Right carotid stenosis. Follow-up vascular surgery. Not a surgical candidate at this time 9.Atrial flutter. Ablation 2013. Cardiac rate controlled -hgb normal , eliquis , was started on 10/27/2016, hemoglobin stable at 12.8 on 10/30/2016 10. BPH. Avodart 0.5 mg daily.     LOS (Days) 12 A FACE TO FACE EVALUATION WAS PERFORMED  Charlett Blake, MD 11/02/2016 4:44 PM

## 2016-11-02 NOTE — Plan of Care (Signed)
Problem: RH SKIN INTEGRITY Goal: RH STG SKIN FREE OF INFECTION/BREAKDOWN Skin to remain free from infection and breakdown with mod assistance while on rehab   Outcome: Progressing Tegaderm to left elbow dry and intact, no signs of infection noted Goal: RH STG MAINTAIN SKIN INTEGRITY WITH ASSISTANCE STG Maintain Skin Integrity With mod Assistance.    Outcome: Progressing Total assistance  Problem: RH SAFETY Goal: RH STG ADHERE TO SAFETY PRECAUTIONS W/ASSISTANCE/DEVICE STG Adhere to Safety Precautions With max Assistance/Device.    Outcome: Not Progressing Extremely confused, unable to follow directions

## 2016-11-02 NOTE — Plan of Care (Signed)
Problem: RH SAFETY Goal: RH STG ADHERE TO SAFETY PRECAUTIONS W/ASSISTANCE/DEVICE STG Adhere to Safety Precautions With max Assistance/Device.    Outcome: Not Progressing Up in the recliner at the nursing station and sleeping at this time

## 2016-11-02 NOTE — Progress Notes (Signed)
Occupational Therapy Session Note  Patient Details  Name: Kelly Lloyd MRN: [id number] Date of Birth: 30-Sep-1927  Today's Date: 11/02/2016 OT Individual Time: 2376-2831 OT Individual Time Calculation (min): 60 min    Short Term Goals: Week 2:  OT Short Term Goal 1 (Week 2): STG=LTG due to LOS  Skilled Therapeutic Interventions/Progress Updates:    Pt seen this session for ADL training with a focus on L side awareness, use of LUE and attention to task. Pt received in wc finishing his breakfast. He was able to find items on L side of tray without difficulty.  His LUE was continually falling off to side of w/c. He did place arm up on table without cues but then it would fall off. Pt resistant to changing his clothing.  Pt set up at sink to brush teeth which he did without cues.  To get started on bathing, his shirt was slowly taken off for him.  Pt did bathe UB  But needed A for thoroughness but the did not like having A.  He donned shirt with mod A.  Refused to change clothing.  Pt wanted to don shoes.  He was able to but needed A to tie laces.  He was very resistant to A to tie and was determined to do it himself trying to use L hand but could not have L hand manipulate the lace. He continually was bending forward, in an unsafe position.  He became agitated with tactile support.  To distract pt, pt taken out of room to day room.  He was continuing to focus on tying shoes.  Finally, therapist was able to do it for him. Pt taken back to room, telesitter privacy turned off, call light in reach, quick release belt on, lap tray in place.   Therapy Documentation Precautions:  Precautions Precautions: Fall Precaution Comments: Dementia Restrictions Weight Bearing Restrictions: No      Pain: Pain Assessment Pain Assessment: No/denies pain ADL:   See Function Navigator for Current Functional Status.   Therapy/Group: Individual Therapy  [employer] 11/02/2016, 12:10 PM

## 2016-11-02 NOTE — Progress Notes (Addendum)
TB test administered to mid right interior forearm.

## 2016-11-02 NOTE — Progress Notes (Signed)
Occupational Therapy Session Note  Patient Details  Name: Kelly Lloyd MRN: [id number] Date of Birth: 11-28-1927  Today's Date: 11/02/2016 OT Individual Time: 1300-1415 OT Individual Time Calculation (min): 75 min    Short Term Goals: Week 2:  OT Short Term Goal 1 (Week 2): STG=LTG due to LOS  Skilled Therapeutic Interventions/Progress Updates:    Pt seen for OT session focusing on ADL re-training, functional mobility, and task initiation/ participation. Pt sitting up in recliner upon arrival, pleasant and granddaughter present. He pointed to bathroom and ambulated with hand held assist, managing doorways and light switches. He required steadying assist for clothing manipulation. Pt able to complete hygiene with assist to problem solve toilet paper dispenser. He ambulated out to sink to wash hands, attending to L UE with increased time. Due to pt's cognitive level/ dis-orientation, he has difficulty with transitioning of tasks, difficult to engage in tasks set-up or requested by therapist. Worked with pt to put away clothing items in drawer. Pt attempting to use L UE at dominant level to carry pile of clothing, no awareness of deficits of L UE, decreased balance or safety awareness. Required heavy steadying assist for safety to prevent fall as pt attempting to carry pile of clothing across room.  He ambulated in hallway to find wife and sat next to her at table. He then refused all other therapeutic attempts including returning to room. Attempted to engage pt in multiple ADL/ IADL tasks with pt refusing. Music used as modality to assist in deescalating pt. Following ~45 minutes pt willing to return to home. Assist to safely position in w/c and QRB donned. Pt asleep in w/c before therapist exited room. [name] present for session, she appears to have difficulty coping with and managing pt's behaviors as he sun downs in PM sessions.    Therapy Documentation Precautions:   Precautions Precautions: Fall Precaution Comments: Dementia Restrictions Weight Bearing Restrictions: No Pain:   No/ denies pain  See Function Navigator for Current Functional Status.   Therapy/Group: Individual Therapy  Lewis, Brenlee Koskela C 11/02/2016, 7:20 AM

## 2016-11-02 NOTE — Plan of Care (Signed)
Problem: RH SKIN INTEGRITY Goal: RH STG SKIN FREE OF INFECTION/BREAKDOWN Skin to remain free from infection and breakdown with mod assistance while on rehab   Outcome: Not Progressing Mild excoriation noted to coccyx area, skin care preformed and coccyx covered with Allevyn sacral foam  Problem: RH SAFETY Goal: RH STG ADHERE TO SAFETY PRECAUTIONS W/ASSISTANCE/DEVICE STG Adhere to Safety Precautions With max Assistance/Device.    Outcome: Not Progressing Patient remains confused and on tele sitter watch, patient is up and  sleeping in the recliner at the nursing station to be watched

## 2016-11-03 ENCOUNTER — Inpatient Hospital Stay (HOSPITAL_COMMUNITY): Payer: Medicare Other | Admitting: Physical Therapy

## 2016-11-03 ENCOUNTER — Inpatient Hospital Stay (HOSPITAL_COMMUNITY): Payer: Medicare Other | Admitting: Occupational Therapy

## 2016-11-03 DIAGNOSIS — G8324 Monoplegia of upper limb affecting left nondominant side: Secondary | ICD-10-CM

## 2016-11-03 MED ORDER — MEMANTINE HCL 10 MG PO TABS: 10.0000 mg | ORAL_TABLET | Freq: Two times a day (BID) | ORAL | 0 refills | Status: DC

## 2016-11-03 MED ORDER — DONEPEZIL HCL 10 MG PO TABS: 10.0000 mg | ORAL_TABLET | Freq: Every day | ORAL | 0 refills | Status: DC

## 2016-11-03 MED ORDER — APIXABAN 5 MG PO TABS: 5.0000 mg | ORAL_TABLET | Freq: Two times a day (BID) | ORAL | 0 refills | Status: DC

## 2016-11-03 MED ORDER — DUTASTERIDE 0.5 MG PO CAPS: 0.5000 mg | ORAL_CAPSULE | Freq: Every day | ORAL | 0 refills | Status: DC

## 2016-11-03 MED ORDER — RISPERIDONE 0.5 MG PO TABS: 0.5000 mg | ORAL_TABLET | Freq: Every day | ORAL | 0 refills | Status: DC

## 2016-11-03 NOTE — Patient Care Conference (Signed)
Inpatient RehabilitationTeam Conference and Plan of Care Update Date: 11/03/2016   Time: 10:45 AM    Patient Name: Kelly Lloyd      Medical Record Number: [id number]  Date of Birth: December 24, 1927 Sex: Male         Room/Bed: 4W15C/4W15C-01 Payor Info: Payor: [place] / Plan: BCBS MEDICARE / Product Type: *No Product type* /    Admitting Diagnosis: R CVA  Admit Date/Time:  10/21/2016  5:36 PM Admission Comments: No comment available   Primary Diagnosis:  Right middle cerebral artery stroke [facility]) Principal Problem: Right middle cerebral artery stroke [facility])  Patient Active Problem List   Diagnosis Date Noted  . Right middle cerebral artery stroke ([place]) 10/21/2016  . Atrial flutter ([place])   . 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 Weight loss 10/18/2016  . Paroxysmal atrial fibrillation ([place]) 10/18/2016  . Status post catheter ablation of atrial fibrillation 10/18/2016  . Alzheimer's disease 10/18/2016  . Acute ischemic stroke (HCC) - R MCA scattered infarcts, unknown etiology 10/17/2016  . Anal stricture 04/17/2014  . Rectal pain 04/17/2014  . Unspecified constipation 08/30/2012  . Medicare annual wellness visit, initial 01/19/2012  . Medically noncompliant 01/19/2012  . Post-nasal drip 01/19/2012  . Hemorrhoid 06/24/2011  . PENILE LESION 02/12/2010  . 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 HYPERCHOLESTEROLEMIA 02/10/2010  . Memory loss 02/05/2009  . DIVERTICULOSIS, COLON 12/15/2006  . BENIGN PROSTATIC HYPERTROPHY 12/15/2006  . OSTEOARTHRITIS, GENERALIZED, MULTIPLE JOINTS 12/15/2006    Expected Discharge Date: Expected Discharge Date: 11/08/16  Team Members Present: Physician leading conference: Dr. Alysia Penna Social Worker Present: Ovidio Kin, LCSW Nurse Present: Rayetta Humphrey, RN PT Present: Barrie Folk, PT;Rosita Dechalus, PTA OT Present: Napoleon Form, OT SLP Present: Weston Anna,  SLP PPS Coordinator present : Daiva Nakayama, RN, CRRN     Current Status/Progress Goal Weekly Team Focus  Medical   Chronic severe dementia, incontinence of bowel, bladder, intermittent restlessness  Maintain medical stability and skin integrity.  Discharge planning   Bowel/Bladder   patient is continent to incontinent of bladder, and has been continent of bowel the past two days  to continue being continent of bowel and bladder  offer toileting during hourly rounding while awake and prn at   Swallow/Nutrition/ Hydration             ADL's   Varies depending on time of day and attention; min-max A overall  Min A overall  Safety awareness; L neuro re-ed; d/c planning   Mobility   minA to min guard transfers, gait >237ft with RW or HHA min assist, minA to supervision w/c mobilty  min assist overall  transfers, gait, balance, d/c planning   Communication             Safety/Cognition/ Behavioral Observations            Pain   denies pain and discomfort this shift   assess for pain every shift and prn  remain pain free   Skin   mild excoriation to coccyx cleaned with saline and covered with pink sacral foam, bruises to arms and legs, skin tear to left elbow covered with Tegadem   assess and change sacral foam every 7 days and prn, prevent further skin brakdown and skin tears  assess and change foam and tegaderm as needed      *See Care Plan and progress notes for long and short-term goals.  Barriers to Discharge  Current Status/Progress Possible Resolutions Date Resolved   Physician    Inaccessible home environment;Behavior     Making some improvement in terms of his strength on the left side. This is helping with his mobility.  Memory care unit level should be achieved by next week      Nursing                  PT                    OT                  SLP                SW     Going to [facility] [place]      11/03/2016    Discharge Planning/Teaching Needs:  Plans now  are to go to [facility] memory care facility in [place]. Wife and son are working on paperwork and it may need to be Monday transfer, may not be able to accommodate him on friday      Team Discussion: making progress toward his goals of min assist level, tends to flucuates depending upon time of day, better in the Am. Plan to go to memory care unit when DC. Wife and son here daily. Medically stable. Getting more movement in L-UE. Continent with timed toileting  Revisions to Treatment Plan:  DC 7/23    Continued Need for Acute Rehabilitation Level of Care: The patient requires daily medical management by a physician with specialized training in physical medicine and rehabilitation for the following conditions: Daily direction of a multidisciplinary physical rehabilitation program to ensure safe treatment while eliciting the highest outcome that is of practical value to the patient.: Yes Daily medical management of patient stability for increased activity during participation in an intensive rehabilitation regime.: Yes Daily analysis of laboratory values and/or radiology reports with any subsequent need for medication adjustment of medical intervention for : Neurological problems;Mood/behavior problems  [name] 11/03/2016, 4:04 PM

## 2016-11-03 NOTE — Progress Notes (Signed)
Occupational Therapy Session Note  Patient Details  Name: Kelly Lloyd MRN: [id number] Date of Birth: [birth year]  Today's Date: 11/03/2016 OT Individual Time: 0930-1030 OT Individual Time Calculation (min): 60 min    Short Term Goals: Week 2:  OT Short Term Goal 1 (Week 2): STG=LTG due to LOS  Skilled Therapeutic Interventions/Progress Updates:    Pt seen for OT ADL bathing/dressing session. Pt received on toilet with hand off from RN. HE was pleasant and allowed therapist and nurse to assist with hygeien/ clothing management and return safely to w/c. He completed UB/LB bating/dressing from w/c level at sink. Cuing to attend to L UE and hand over hand assist to wash R UE. He required assist to thread UEs into sleeves, able to complete remainder of task with increased time. He threaded B LEs into pants and stood with steadying assist to pull pants up. Requested total A for donning socks and shoes, lotion appllied as well due to try skin.  Requested toileting again, ambulated with hand held assist into bathroom and +BM. He perseverated on use of toilet paper, though finally allowing for therapist to provide assist for thorough hygiene.  He completed oral care in standing, cuing to initiate use of toothpaste and assist to apply toothpaste.  He returned to w/c and left seated with wife, QRB donned.   Therapy Documentation Precautions:  Precautions Precautions: Fall Precaution Comments: Dementia Restrictions Weight Bearing Restrictions: No Pain:   No/ denies pain  See Function Navigator for Current Functional Status.   Therapy/Group: Individual Therapy  Lewis, Lynzie Cliburn C 11/03/2016, 7:12 AM

## 2016-11-03 NOTE — Progress Notes (Signed)
Physical Therapy Session Note  Patient Details  Name: Kelly Lloyd MRN: [id number] Date of Birth: 09-Mar-1928  Today's Date: 11/03/2016 PT Individual Time: 1300-1345 PT Individual Time Calculation (min): 45 min   Short Term Goals: Week 1:  PT Short Term Goal 1 (Week 1): Pt will perform sit<>stand with min assist consistently PT Short Term Goal 1 - Progress (Week 1): Met PT Short Term Goal 2 (Week 1): Pt will initate WC mobility  PT Short Term Goal 2 - Progress (Week 1): Met PT Short Term Goal 3 (Week 1): Pt will ambulate 185f with mod assist and LRAD  PT Short Term Goal 3 - Progress (Week 1): Met PT Short Term Goal 4 (Week 1): Pt will perform bed mobiltiy with min assist from PT.  PT Short Term Goal 4 - Progress (Week 1): Met Week 2:  PT Short Term Goal 1 (Week 2): STG=LTg due to ELOS  Skilled Therapeutic Interventions/Progress Updates:   Pt received sitting in WC and agreeable to PT  WC mobility throughout hall of hospital x [id number] supervision assist from PT. Moderate cues for encouragement to participate on this day; decreased ability to re-direct to allow improved attention to task [facility]. Pt utilized BLE for WC propulsion throughout rehab unit .   PT instructed pt in dynamic gait training through day room to clean up following party with min assist and no AD. Moderate cues for improved awareness of obstacles on the L as well as increased use of the RUE to pick up plates and table clothes. Pt noted to have difficulty retaining purpose of cleaning task on this day. Pt declined any additional gait training once sitting back in WC.   Patient returned too room and [facility] in reach and all needs met.         Therapy Documentation Precautions:  Precautions Precautions: Fall Precaution Comments: Dementia Restrictions Weight Bearing Restrictions: No Pain: Pain Assessment Faces Pain Scale: No  hurt    See Function Navigator for Current Functional Status.   Therapy/Group: Individual Therapy  Kelly Lloyd/2018, 2:12 PM

## 2016-11-03 NOTE — Progress Notes (Signed)
Physical Therapy Session Note  Patient Details  Name: Kelly Lloyd MRN: [id number] Date of Birth: 1927/10/31  Today's Date: 11/03/2016 PT Individual Time: 16 - 47      Short Term Goals: Week 2:  PT Short Term Goal 1 (Week 2): STG=LTg due to ELOS  Skilled Therapeutic Interventions/Progress Updates: Pt presented in bed sleeping upon arrival. Pt easy to arouse but remained lethargic initially, requiring several attempts to stay awake. Pt eventually to transfer supine to sit with minA. Pt initially demonstrated poor sitting tolerance with significant L posteriorlateral lean requiring PTA assist for correction. PTA donned pants for time management, sit to stand minA with posterior lean and total assist for pulling up pants. Performed HHA stand pivot to w/c where pt completed breakfast. Pt ambulated to sink to wash hands with pt attempting to use LUE. Pt noted to have incontinence of bowl while standing, ambulated to toilet with HHA and PTA provided minA for doffing brief. Pt returned to w/c and left with WRB and half lap tray in place.      Therapy Documentation Precautions:  Precautions Precautions: Fall Precaution Comments: Dementia Restrictions Weight Bearing Restrictions: No General:   Vital Signs: Therapy Vitals Temp: (!) 96.7 F (35.9 C) Temp Source: Axillary Pulse Rate: (!) 59 Resp: 17 BP: (!) 142/58 Patient Position (if appropriate): Lying Oxygen Therapy SpO2: 98 % O2 Device: Not Delivered Pain:   Mobility:   Locomotion :    Trunk/Postural Assessment :    Balance:   Exercises:   Other Treatments:     See Function Navigator for Current Functional Status.   Therapy/Group: Individual Therapy  Marvelyn Bouchillon 11/03/2016, 8:00 AM

## 2016-11-03 NOTE — Progress Notes (Signed)
Physical Therapy Session Note  Patient Details  Name: Kelly Lloyd MRN: [id number] Date of Birth: 26-Oct-1927  Today's Date: 11/03/2016 PT Individual Time: 3734-2876 PT Individual Time Calculation (min): 24 min   Short Term Goals: Week 2:  PT Short Term Goal 1 (Week 2): STG=LTg due to ELOS  Skilled Therapeutic Interventions/Progress Updates:    no c/o pain, requires frequent cues to attend during session.  PT session focus on activity tolerance, initiation, and activity tolerance.  Pt participated in pet therapy with recreational therapist, no recall of having already participated today.  Dynavision mode A for 2 minutes focus on sustained attention, recall of instructions, visual scanning, and initiation.  Pt scored 8 hits in 2 minutes, requires cues for hitting target on 6/8 attempts.  Kinetron x5 minutes focus on reciprocal stepping pattern, activity tolerance, and attention with max multimodal cues to continue.  Pt returned to room at end of session and positioned in w/c with QRB in place, call bell in reach, and needs met, Tele-sitter present.   Therapy Documentation Precautions:  Precautions Precautions: Fall Precaution Comments: Dementia Restrictions Weight Bearing Restrictions: No   See Function Navigator for Current Functional Status.   Therapy/Group: Individual Therapy  Earnest Conroy Penven-Crew 11/03/2016, 4:02 PM

## 2016-11-03 NOTE — Progress Notes (Signed)
[place] PHYSICAL MEDICINE & REHABILITATION     PROGRESS NOTE    Subjective/Complaints:  Patient is in bed, sleeping, awakens briefly to verbal command  ROS: limited by cognition   Objective: Vital Signs: Blood pressure (!) 142/58, pulse (!) 59, temperature (!) 96.7 F (35.9 C), temperature source Axillary, resp. rate 17, weight 80.1 kg (176 lb 8 oz), SpO2 98 %. No results found. No results for input(s): WBC, HGB, HCT, PLT in the last 72 hours. No results for input(s): NA, K, CL, GLUCOSE, BUN, CREATININE, CALCIUM in the last 72 hours.  Invalid input(s): CO CBG (last 3)  No results for input(s): GLUCAP in the last 72 hours.  Wt Readings from Last 3 Encounters:  10/27/16 80.1 kg (176 lb 8 oz)  10/17/16 80.7 kg (177 lb 14.6 oz)  10/17/16 63.2 kg (139 lb 5.3 oz)    Physical Exam:   Gen: restless.  HENT:  Head: Normocephalic.  Eyes: EOMare normal.  Neck: Normal range of motion. Neck supple. No thyromegalypresent.  Cardiovascular: RRR without murmur. No JVD    Respiratory: CTA Bilaterally without wheezes or rales. Normal effort   GI: Soft. Bowel sounds are normal. He exhibits no distension.  Skin. Warm and dry Musculoskeletal: He exhibits no edemaor tenderness. No pain with Left shoulder ROM Neurological:    Makes eye contact and can follow simple commands. Can answer his name, Santarelli  Difficult to perform muscle testing, does have left upper extremity weakness but able to move antigravity spontaneously  Psych: Aphasic, confused but pleasant  Assessment/Plan: 1. Functional deficits  secondary to right MCA infarct which require 3+ hours per day of interdisciplinary therapy in a comprehensive inpatient rehab setting. Physiatrist is providing close team supervision and 24 hour management of active medical problems listed below. Physiatrist and rehab team continue to assess barriers to discharge/monitor patient progress toward functional and medical  goals.  Function:  Bathing Bathing position Bathing activity did not occur: Refused (only consented to peri area) Position: Wheelchair/chair at sink  Bathing parts Body parts bathed by patient: Chest, Abdomen, Right arm, Left arm (UB only, pt refused LB) Body parts bathed by helper: Back  Bathing assist Assist Level: Touching or steadying assistance(Pt > 75%)      Upper Body Dressing/Undressing Upper body dressing Upper body dressing/undressing activity did not occur: Refused What is the patient wearing?: Pull over shirt/dress     Pull over shirt/dress - Perfomed by patient: Thread/unthread right sleeve, Pull shirt over trunk Pull over shirt/dress - Perfomed by helper: Put head through opening, Thread/unthread left sleeve        Upper body assist Assist Level: Touching or steadying assistance(Pt > 75%)      Lower Body Dressing/Undressing Lower body dressing   What is the patient wearing?: Pants, Socks, Shoes Underwear - Performed by patient: Thread/unthread right underwear leg Underwear - Performed by helper: Thread/unthread left underwear leg, Pull underwear up/down Pants- Performed by patient: Pull pants up/down, Thread/unthread right pants leg Pants- Performed by helper: Thread/unthread left pants leg     Socks - Performed by patient: Don/doff right sock, Don/doff left sock Socks - Performed by helper: Don/doff right sock, Don/doff left sock Shoes - Performed by patient: Don/doff right shoe, Don/doff left shoe Shoes - Performed by helper: Fasten right, Fasten left          Lower body assist Assist for lower body dressing: Touching or steadying assistance (Pt > 75%)      Toileting Toileting Toileting activity did not  occur: No continent bowel/bladder event Toileting steps completed by patient: Adjust clothing prior to toileting Toileting steps completed by helper: Performs perineal hygiene, Adjust clothing after toileting Toileting Assistive Devices: Grab bar or  rail  Toileting assist Assist level: Touching or steadying assistance (Pt.75%)   Transfers Chair/bed transfer   Chair/bed transfer method: Ambulatory Chair/bed transfer assist level: Touching or steadying assistance (Pt > 75%) Chair/bed transfer assistive device: Armrests     Locomotion Ambulation     Max distance: 244f Assist level: Touching or steadying assistance (Pt > 75%)   Wheelchair Wheelchair activity did not occur: Refused Type: Manual Max wheelchair distance: 1574f Assist Level: Supervision or verbal cues  Cognition Comprehension Comprehension assist level: Understands basic 25 - 49% of the time/ requires cueing 50 - 75% of the time  Expression Expression assist level: Expresses basic 25 - 49% of the time/requires cueing 50 - 75% of the time. Uses single words/gestures.  Social Interaction Social Interaction assist level: Interacts appropriately 50 - 74% of the time - May be physically or verbally inappropriate.  Problem Solving Problem solving assist level: Solves basic less than 25% of the time - needs direction nearly all the time or does not effectively solve problems and may need a restraint for safety  Memory Memory assist level: Recognizes or recalls less than 25% of the time/requires cueing greater than 75% of the time   Medical Problem List and Plan: 1. Left side weaknesssecondary to right MCA infarct -continue PT, OT, SLP, Team conference today please see physician documentation under team conference tab, met with team face-to-face to discuss problems,progress, and goals. Formulized individual treatment plan based on medical history, underlying problem and comorbidities.  - tentative discharge date 11/06/2016 .2. DVT Prophylaxis/Anticoagulation:  Eliquis, no evidence of bleeding. Monitor platelet counts and any signs of bleeding 3. Pain Management: Tylenol 4. Mood/cognition: Namenda 10 mg twice a day, Aricept 10 mg daily at  bedtime -continue risperdal 0.'5mg'$  at 8pm for sundowning-good results so far  -low bed  -restraints still required for patient safety  -ua neg, ucx with multispecies--- repeat cath urine. Negative 5. Neuropsych: This patient isnot capable of making decisions on hisown behalf. 6. Skin/Wound Care: 2 stage 2 areas  Sup aspect of gluteal cleft and Left upper sacral area no drainage or odor- foam dressing 7. Fluids/Electrolytes/Nutrition: Routine I&O with follow-up chemistries reviewed - good meal intake 8.Right carotid stenosis. Follow-up vascular surgery. Not a surgical candidate at this time 9.Atrial flutter. Ablation 2013. Cardiac rate controlled -hgb normal , eliquis , was started on 10/27/2016, hemoglobin stable at 12.8 on 10/30/2016 10. BPH. Avodart 0.5 mg daily.     LOS (Days) [place]Charlett BlakeMD 11/03/2016 10:21 AM

## 2016-11-04 ENCOUNTER — Inpatient Hospital Stay (HOSPITAL_COMMUNITY): Payer: Medicare Other | Admitting: Occupational Therapy

## 2016-11-04 ENCOUNTER — Inpatient Hospital Stay (HOSPITAL_COMMUNITY): Payer: Medicare Other | Admitting: Physical Therapy

## 2016-11-04 DIAGNOSIS — G301 Alzheimer's disease with late onset: Secondary | ICD-10-CM

## 2016-11-04 LAB — CREATININE, SERUM
Creatinine, Ser: 1.26 mg/dL — ABNORMAL HIGH (ref 0.61–1.24)
GFR, EST AFRICAN AMERICAN: 57 mL/min — AB (ref >60)
GFR, EST NON AFRICAN AMERICAN: 49 mL/min — AB (ref >60)

## 2016-11-04 NOTE — Progress Notes (Signed)
Occupational Therapy Session Note  Patient Details  Name: Kelly Lloyd MRN: [id number] Date of Birth: [birth year]  Today's Date: 11/04/2016 OT Individual Time: 1130-1200 OT Individual Time Calculation (min): 30 min    Short Term Goals: Week 2:  OT Short Term Goal 1 (Week 2): STG=LTG due to LOS  Skilled Therapeutic Interventions/Progress Updates:    Treatment session with focus on functional mobility and following one step directions.  Pt received upright in w/c with wife present, required mod redirection/encouragement to participate in treatment session.  Engaged in short distance ambulation with min assist for stability while organizing Dayroom.  Pt utilized LUE as gross assist to stabilize items during transportation, requiring assist to place on higher shelves due to inattention to Lt during reaching.  Organized picnic basket with pt utilizing LUE as gross assist to sort items, stack plates, and return items to basket.    Therapy Documentation Precautions:  Precautions Precautions: Fall Precaution Comments: Dementia Restrictions Weight Bearing Restrictions: No Pain: Pain Assessment Pain Assessment: No/denies pain Faces Pain Scale: No hurt  See Function Navigator for Current Functional Status.   Therapy/Group: Individual Therapy  Simonne Come 11/04/2016, 12:23 PM

## 2016-11-04 NOTE — Progress Notes (Signed)
Physical Therapy Session Note  Patient Details  Name: Kelly Lloyd Start MRN: [id number] Date of Birth: 15-Mar-1928  Today's Date: 11/04/2016 PT Individual Time: 3154-0086 AND 1600-1630 PT Individual Time Calculation (min): 57 min AND 30 min   Short Term Goals: Week 2:  PT Short Term Goal 1 (Week 2): STG=LTg due to ELOS  Skilled Therapeutic Interventions/Progress Updates:  Session 1.   Pt received sitting in WC and agreeable to PT with significant encouragement.  PT transported pt to day room. Attempted to have pt perform standing balance while completing cow puzzle, but pt declined standing at this time. Max multimodal cues for problem solving and awareness.   PT instructed pt in [facility] mobility through hospital to multiple rooms using BLE for propulsion. Pt stating that he wanted to get back to the other room. PT allowed pt to attempt to locate room with max cues for use of signs throughout hospital with little to no evidence of awareness to location in hospital of how to properly use signs. Guided by PT, Patient returned too room and left sitting in [facility] with call bell in reach and all needs met.     Session 2.    Pt received sitting in WC and agreeable to PT LUE NeuroMotor Re-education to hit ball with 1lb bar weight using BUE. 2 bouts x 2 minutes each.  Horse shoes, with instruction to grab horse shoe with LUE and throughout with the RUE. Moderate cues for proper reaching technique and encouraged use of LUE.  PT also instructed pt in Velcro ball toss. Using the LUE to catch ball and the R UE to throw to PT. Moderate cues for proper use of LUE to catch ball. Noted improvement with reaction ot L hand towards ball with increased time. Pt left sitting in [facility] with family present.      Therapy Documentation Precautions:  Precautions Precautions: Fall Precaution Comments: Dementia Restrictions Weight Bearing Restrictions: No Pain: Faces: none.   See Function Navigator for Current Functional  Status.   Therapy/Group: Individual Therapy  Lorie Phenix 11/04/2016, 3:06 PM

## 2016-11-04 NOTE — Discharge Summary (Signed)
Discharge summary job # [id number]

## 2016-11-04 NOTE — Progress Notes (Signed)
There is not induration on pt's right forearm.

## 2016-11-04 NOTE — Progress Notes (Signed)
Social Work Patient ID: Kelly Lloyd, male   DOB: [birth year], 83 y.o.   MRN: [id number]  Have faxed medical information into [facility] in anticipation of admission Monday 7/23. Wife is aware and discussed  Son taking pt on Monday via car to facility. Pt is doing better and has adjusted to the rehab unit. She is aware he will need to adjust to being in a new environment at [facility] also. She feels it is a huge Relief this plan. Will contact [facility] regarding any other information they may need for pt's admission.

## 2016-11-04 NOTE — Progress Notes (Signed)
Occupational Therapy Session Note  Patient Details  Name: Kelly Lloyd MRN: [id number] Date of Birth: [birth year]  Today's Date: 11/04/2016 OT Individual Time: 3536-1443 OT Individual Time Calculation (min): 64 min    Short Term Goals: Week 2:  OT Short Term Goal 1 (Week 2): STG=LTG due to LOS  Skilled Therapeutic Interventions/Progress Updates:    Pt seen for OT ADL bathing/dressing session. Pt sitting up in w/c upon arrival, eating breakfast and pleasant. Following breakfast, he completed bathing/dressing seated in w/c at sink. Pt independently initiated use of L UE at gross assist stabilizer level throughout session, for example placing soap bottle in hand to twist off crap using R UE and holding toothbrush with L UE while applying toothpaste.  He required assist for clothing orientation of shirt to place B UEs into sleeves, however, able to complete remainder of task. He initiated threading of L LE into pants leg prior to donning R side and stood with steadying assist to pull pants up. Twice during session, he was able to make need known for toileting task, able to complete 3/3 toileting tasks with steadying assist and assist from therapist for thoroughness following BM. Grooming tasks completed from standing position at sink, therapist placed L UE into WBing position when pt not using hand functionally during task.  Pt returned to w/c at end of session, left with QRB donned and wife present.   Therapy Documentation Precautions:  Precautions Precautions: Fall Precaution Comments: Dementia Restrictions Weight Bearing Restrictions: No Pain:   No/ denies pain  See Function Navigator for Current Functional Status.   Therapy/Group: Individual Therapy  Lewis, [facility] C 11/04/2016, 6:59 AM

## 2016-11-04 NOTE — Progress Notes (Signed)
[place] PHYSICAL MEDICINE & REHABILITATION     PROGRESS NOTE    Subjective/Complaints:  Sleeping this am  In hi/low bed, no c/os  ROS: limited by cognition   Objective: Vital Signs: Blood pressure (!) 155/59, pulse 82, temperature 98.7 F (37.1 C), temperature source Oral, resp. rate 18, height 5\' 11"  (1.803 m), weight 80.1 kg (176 lb 8 oz), SpO2 99 %. No results found. No results for input(s): WBC, HGB, HCT, PLT in the last 72 hours.  Recent Labs  11/04/16 0406  CREATININE 1.26*   CBG (last 3)  No results for input(s): GLUCAP in the last 72 hours.  Wt Readings from Last 3 Encounters:  10/27/16 80.1 kg (176 lb 8 oz)  10/17/16 80.7 kg (177 lb 14.6 oz)  10/17/16 63.2 kg (139 lb 5.3 oz)    Physical Exam:   Gen: restless.  HENT:  Head: Normocephalic.  Eyes: EOMare normal.  Neck: Normal range of motion. Neck supple. No thyromegalypresent.  Cardiovascular: RRR without murmur. No JVD    Respiratory: CTA Bilaterally without wheezes or rales. Normal effort   GI: Soft. Bowel sounds are normal. He exhibits no distension.  Skin. Warm and dry Musculoskeletal: He exhibits no edemaor tenderness. No pain with Left shoulder ROM Neurological:    Makes eye contact and can follow simple commands. Can answer his name, Seib  Difficult to perform muscle testing, does have left upper extremity weakness but able to move antigravity spontaneously  Psych: Aphasic, confused but pleasant  Assessment/Plan: 1. Functional deficits  secondary to right MCA infarct which require 3+ hours per day of interdisciplinary therapy in a comprehensive inpatient rehab setting. Physiatrist is providing close team supervision and 24 hour management of active medical problems listed below. Physiatrist and rehab team continue to assess barriers to discharge/monitor patient progress toward functional and medical goals.  Function:  Bathing Bathing position Bathing activity did not occur: Refused (only  consented to peri area) Position: Shower  Bathing parts Body parts bathed by patient: Chest, Abdomen, Left arm, Left upper leg, Right lower leg, Right upper leg, Left lower leg Body parts bathed by helper: Right arm, Front perineal area, Buttocks, Back  Bathing assist Assist Level: Touching or steadying assistance(Pt > 75%)      Upper Body Dressing/Undressing Upper body dressing Upper body dressing/undressing activity did not occur: Refused What is the patient wearing?: Pull over shirt/dress     Pull over shirt/dress - Perfomed by patient: Put head through opening, Pull shirt over trunk Pull over shirt/dress - Perfomed by helper: Thread/unthread right sleeve, Thread/unthread left sleeve        Upper body assist Assist Level: Touching or steadying assistance(Pt > 75%)      Lower Body Dressing/Undressing Lower body dressing   What is the patient wearing?: Pants, Socks Underwear - Performed by patient: Pull underwear up/down Underwear - Performed by helper: Thread/unthread left underwear leg, Pull underwear up/down Pants- Performed by patient: Pull pants up/down, Thread/unthread right pants leg Pants- Performed by helper: Thread/unthread left pants leg   Non-skid slipper socks- Performed by helper: Don/doff right sock, Don/doff left sock Socks - Performed by patient: Don/doff right sock, Don/doff left sock Socks - Performed by helper: Don/doff right sock, Don/doff left sock Shoes - Performed by patient: Don/doff right shoe, Don/doff left shoe Shoes - Performed by helper: Don/doff right shoe, Don/doff left shoe, Fasten right, Fasten left          Lower body assist Assist for lower body dressing: Touching or  steadying assistance (Pt > 75%)      Toileting Toileting Toileting activity did not occur: No continent bowel/bladder event Toileting steps completed by patient: Adjust clothing prior to toileting Toileting steps completed by helper: Adjust clothing prior to toileting,  Performs perineal hygiene, Adjust clothing after toileting Toileting Assistive Devices: Grab bar or rail  Toileting assist Assist level: Touching or steadying assistance (Pt.75%)   Transfers Chair/bed transfer   Chair/bed transfer method: Ambulatory Chair/bed transfer assist level: Touching or steadying assistance (Pt > 75%) Chair/bed transfer assistive device: Armrests     Locomotion Ambulation     Max distance: 162ft  Assist level: Touching or steadying assistance (Pt > 75%)   Wheelchair Wheelchair activity did not occur: Refused Type: Manual Max wheelchair distance: 222ft  Assist Level: Supervision or verbal cues  Cognition Comprehension Comprehension assist level: Understands basic 25 - 49% of the time/ requires cueing 50 - 75% of the time  Expression Expression assist level: Expresses basic 25 - 49% of the time/requires cueing 50 - 75% of the time. Uses single words/gestures.  Social Interaction Social Interaction assist level: Interacts appropriately 25 - 49% of time - Needs frequent redirection.  Problem Solving Problem solving assist level: Solves basic less than 25% of the time - needs direction nearly all the time or does not effectively solve problems and may need a restraint for safety  Memory Memory assist level: Recognizes or recalls less than 25% of the time/requires cueing greater than 75% of the time   Medical Problem List and Plan: 1. Left side weaknesssecondary to right MCA infarct -continue PT, OT, SLP, plan is to memory care unit next wk  - tentative discharge date 07/23+/2018 .2. DVT Prophylaxis/Anticoagulation:  Eliquis, no evidence of bleeding. Monitor platelet counts and any signs of bleeding 3. Pain Management: Tylenol 4. Mood/cognition: Namenda 10 mg twice a day, Aricept 10 mg daily at bedtime -continue risperdal 0.5mg  at 8pm for sundowning-good results so far  -low bed  -restraints still required for patient safety  -ua neg,  ucx with multispecies--- repeat cath urine. Negative 5. Neuropsych: This patient isnot capable of making decisions on hisown behalf. 6. Skin/Wound Care: 2 stage 2 areas  Sup aspect of gluteal cleft and Left upper sacral area no drainage or odor- foam dressing 7. Fluids/Electrolytes/Nutrition: Routine I&O with follow-up chemistries reviewed - good meal intake 8.Right carotid stenosis. Follow-up vascular surgery. Not a surgical candidate at this time 9.Atrial flutter. Ablation 2013. Cardiac rate controlled -hgb normal , eliquis , was started on 10/27/2016, hemoglobin stable at 12.8 on 10/30/2016 10. BPH. Avodart 0.5 mg daily.     LOS (Days) [place]  Charlett Blake, MD 11/04/2016 7:13 AM

## 2016-11-04 NOTE — Plan of Care (Signed)
Problem: RH Dressing Goal: LTG Patient will perform upper body dressing (OT) LTG Patient will perform upper body dressing with assist, with/without cues (OT).  Goal downgraded due to pt progress. Briar Sword Lewis, OTR/L   Problem: RH Functional Use of Upper Extremity Goal: LTG Patient will use RT/LT upper extremity as a (OT) LTG: Patient will use right/left upper extremity as a stabilizer/gross assist/diminished/nondominant/dominant level with assist, with/without cues during functional activity (OT)  Goal upgraded due to pt progress. Napoleon Form, OTR/L 7/19

## 2016-11-05 ENCOUNTER — Inpatient Hospital Stay (HOSPITAL_COMMUNITY): Payer: Medicare Other | Admitting: Physical Therapy

## 2016-11-05 ENCOUNTER — Inpatient Hospital Stay (HOSPITAL_COMMUNITY): Payer: Medicare Other | Admitting: Occupational Therapy

## 2016-11-05 NOTE — Progress Notes (Signed)
Occupational Therapy Session Note  Patient Details  Name: Garlan D Nieto MRN: [id number] Date of Birth: 10/01/1927  Today's Date: 11/05/2016 OT Individual Time: 1000-1030 OT Individual Time Calculation (min): 30 min    Short Term Goals: Week 1:  OT Short Term Goal 1 (Week 1): Pt will sit to stand with min A in prep for clothing management OT Short Term Goal 1 - Progress (Week 1): Met OT Short Term Goal 2 (Week 1): Pt will locate 3/5 needed grooming items wiht min VC for scanning L on sink OT Short Term Goal 2 - Progress (Week 1): Met OT Short Term Goal 3 (Week 1): Pt will transfer to BSC/toilet with MOD A and LRAD OT Short Term Goal 3 - Progress (Week 1): Met OT Short Term Goal 4 (Week 1): Pt will don shirt with MOD A OT Short Term Goal 4 - Progress (Week 1): Met OT Short Term Goal 5 (Week 1): Pt will complete 2/3 components of LB dressing with touching A OT Short Term Goal 5 - Progress (Week 1): Progressing toward goal  Skilled Therapeutic Interventions/Progress Updates:    1:1 Pt participated in activity of changing the sheets and bedding on ADL apartment regular height bed with focus on functional mobility including turns, functional automatic use of left UE, dynamic balance and sequencing with mod cuing for sequence and occasional min A.   Therapy Documentation Precautions:  Precautions Precautions: Fall Precaution Comments: Dementia Restrictions Weight Bearing Restrictions: No Pain:  no c/o pain   See Function Navigator for Current Functional Status.   Therapy/Group: Individual Therapy  ,  Lynsey 11/05/2016, 4:17 PM 

## 2016-11-05 NOTE — Progress Notes (Signed)
[place] PHYSICAL MEDICINE & REHABILITATION     PROGRESS NOTE    Subjective/Complaints:  No issues overnite, pt up in chair getting ready for breakfast , aide assisting  ROS: limited by cognition   Objective: Vital Signs: Blood pressure 107/68, pulse 69, temperature 97.7 F (36.5 C), temperature source Oral, resp. rate 18, height 5\' 11"  (1.803 m), weight 80.1 kg (176 lb 8 oz), SpO2 98 %. No results found. No results for input(s): WBC, HGB, HCT, PLT in the last 72 hours.  Recent Labs  11/04/16 0406  CREATININE 1.26*   CBG (last 3)  No results for input(s): GLUCAP in the last 72 hours.  Wt Readings from Last 3 Encounters:  10/27/16 80.1 kg (176 lb 8 oz)  10/17/16 80.7 kg (177 lb 14.6 oz)  10/17/16 63.2 kg (139 lb 5.3 oz)    Physical Exam:   Gen: restless.  HENT:  Head: Normocephalic.  Eyes: EOMare normal.  Neck: Normal range of motion. Neck supple. No thyromegalypresent.  Cardiovascular: RRR without murmur. No JVD    Respiratory: CTA Bilaterally without wheezes or rales. Normal effort   GI: Soft. Bowel sounds are normal. He exhibits no distension.  Skin. Warm and dry Musculoskeletal: He exhibits no edemaor tenderness. No pain with Left shoulder ROM Neurological:    Makes eye contact and can follow simple commands. Can answer his name, Kelly Lloyd  Difficult to perform muscle testing, does have left upper extremity weakness but able to move antigravity spontaneously  Psych: Aphasic, confused but pleasant  Assessment/Plan: 1. Functional deficits  secondary to right MCA infarct which require 3+ hours per day of interdisciplinary therapy in a comprehensive inpatient rehab setting. Physiatrist is providing close team supervision and 24 hour management of active medical problems listed below. Physiatrist and rehab team continue to assess barriers to discharge/monitor patient progress toward functional and medical goals.  Function:  Bathing Bathing position Bathing  activity did not occur: Refused (only consented to peri area) Position: Bed  Bathing parts Body parts bathed by patient: Abdomen, Left arm, Right arm, Front perineal area, Chest Body parts bathed by helper: Buttocks, Right upper leg, Left upper leg, Right lower leg, Left lower leg, Back  Bathing assist Assist Level: Touching or steadying assistance(Pt > 75%)      Upper Body Dressing/Undressing Upper body dressing Upper body dressing/undressing activity did not occur: Refused What is the patient wearing?: Pull over shirt/dress     Pull over shirt/dress - Perfomed by patient: Put head through opening, Pull shirt over trunk Pull over shirt/dress - Perfomed by helper: Thread/unthread right sleeve        Upper body assist Assist Level: Touching or steadying assistance(Pt > 75%)      Lower Body Dressing/Undressing Lower body dressing   What is the patient wearing?: Pants Underwear - Performed by patient: Pull underwear up/down Underwear - Performed by helper: Thread/unthread right underwear leg, Thread/unthread left underwear leg Pants- Performed by patient: Pull pants up/down Pants- Performed by helper: Fasten/unfasten pants, Thread/unthread left pants leg, Thread/unthread right pants leg   Non-skid slipper socks- Performed by helper: Don/doff right sock, Don/doff left sock Socks - Performed by patient: Don/doff right sock, Don/doff left sock Socks - Performed by helper: Don/doff right sock, Don/doff left sock Shoes - Performed by patient: Don/doff right shoe, Don/doff left shoe Shoes - Performed by helper: Don/doff right shoe, Don/doff left shoe          Lower body assist Assist for lower body dressing: Touching or steadying  assistance (Pt > 75%)      Toileting Toileting Toileting activity did not occur: No continent bowel/bladder event Toileting steps completed by patient: Adjust clothing prior to toileting, Performs perineal hygiene Toileting steps completed by helper: Adjust  clothing after toileting Toileting Assistive Devices: Grab bar or rail  Toileting assist Assist level: Touching or steadying assistance (Pt.75%)   Transfers Chair/bed transfer   Chair/bed transfer method: Ambulatory Chair/bed transfer assist level: Touching or steadying assistance (Pt > 75%) Chair/bed transfer assistive device: Armrests     Locomotion Ambulation     Max distance: 145ft  Assist level: Touching or steadying assistance (Pt > 75%)   Wheelchair Wheelchair activity did not occur: Refused Type: Manual Max wheelchair distance: 254ft  Assist Level: Supervision or verbal cues  Cognition Comprehension Comprehension assist level: Understands basic 25 - 49% of the time/ requires cueing 50 - 75% of the time  Expression Expression assist level: Expresses basic 25 - 49% of the time/requires cueing 50 - 75% of the time. Uses single words/gestures.  Social Interaction Social Interaction assist level: Interacts appropriately 25 - 49% of time - Needs frequent redirection.  Problem Solving Problem solving assist level: Solves basic less than 25% of the time - needs direction nearly all the time or does not effectively solve problems and may need a restraint for safety  Memory Memory assist level: Recognizes or recalls less than 25% of the time/requires cueing greater than 75% of the time   Medical Problem List and Plan: 1. Left side weaknesssecondary to right MCA infarct -continue PT, OT, SLP, plan is to memory care unit next wk 7/23  - tentative discharge date 07/23+/2018 .2. DVT Prophylaxis/Anticoagulation:  Eliquis, no evidence of bleeding. Monitor platelet counts and any signs of bleeding 3. Pain Management: Tylenol 4. Mood/cognition: Namenda 10 mg twice a day, Aricept 10 mg daily at bedtime -continue risperdal 0.5mg  at 8pm for sundowning-good results so far  -low bed  -restraints still required for patient safety  -ua neg, ucx with multispecies--- repeat  cath urine. Negative 5. Neuropsych: This patient isnot capable of making decisions on hisown behalf. 6. Skin/Wound Care: 2 stage 2 areas  Sup aspect of gluteal cleft and Left upper sacral area no drainage or odor- foam dressing 7. Fluids/Electrolytes/Nutrition: Routine I&O with follow-up chemistries reviewed - good meal intake 8.Right carotid stenosis. Follow-up vascular surgery. Not a surgical candidate at this time 9.Atrial flutter. Ablation 2013. Cardiac rate controlled -hgb normal , eliquis , was started on 10/27/2016, hemoglobin stable at 12.8 on 10/30/2016, recheck prior to D/C 10. BPH. Avodart 0.5 mg daily.     LOS (Days) Edna  Charlett Blake, MD 11/05/2016 7:33 AM

## 2016-11-05 NOTE — Plan of Care (Signed)
Pt's plan of care adjusted to Q.D. Pt awaiting placement in Assisted living/ memory care unit. Pt is near goal level and is not appropriate for intensive daily therapies. He will cont to benefit from daily therapy in order to maintain goal level. Pt planned to d/c to assisted living on 7/23. Napoleon Form, OTR/L

## 2016-11-05 NOTE — Progress Notes (Signed)
Social Work  Discharge Note  The overall goal for the admission was met for:   Discharge location: [facility] C[facility] IN [place]  Length of Stay: Yes-18 DAYS  Discharge activity level: Yes-SUPERVISION-MIN ASSIST LEVEL  Home/community participation: Yes  Services provided included: MD, RD, PT, OT, SLP, RN, CM, Pharmacy and SW  Financial Services: Private Insurance: [place]  Follow-up services arranged: Other: WILL RECEIVE PT & OT AT [facility] HALL ALF  Comments (or additional information):SON TO TRANSPORT TO [facility]ALL-MEMORY CARE ON Monday. PAPERWORK COMPLETED AND PACKET TO BE GIVEN TO FAMILY TO TAKE WITH THEM  Patient/Family verbalized understanding of follow-up arrangements: Yes  Individual responsible for coordination of the follow-up plan: [name]-WIFE & DEAN-SON  Confirmed correct DME delivered: [name] 11/05/2016    [name]

## 2016-11-05 NOTE — Progress Notes (Signed)
Occupational Therapy Weekly Progress Note  Patient Details  Name: Kelly Lloyd MRN: [id number] Date of Birth: May 15, 1927  Beginning of progress report period: October 29, 2016 End of progress report period: November 05, 2016  Today's Date: 11/05/2016 OT Individual Time: 4034-7425 OT Individual Time Calculation (min): 60 min    Short term goals were not set as pt planned d/c for 7/21. Pt's LOS extended to 7/23 with change of d/c disposition to Assisted Living memory care unit. Pt progressing towards LTGs.   Patient continues to demonstrate the following deficits: muscle weakness, decreased cardiorespiratoy endurance and decreased standing balance, decreased postural control, hemiplegia and decreased balance strategies and therefore will continue to benefit from skilled OT intervention to enhance overall performance with BADL and Reduce care partner burden.  Patient progressing toward long term goals..  Continue plan of care.  OT Short Term Goals Week 2:  OT Short Term Goal 1 (Week 2): STG=LTG due to LOS OT Short Term Goal 1 - Progress (Week 2): Progressing toward goal Week 3:  OT Short Term Goal 1 (Week 3): Cont working towards LTG of overall min A  Skilled Therapeutic Interventions/Progress Updates:    Pt seen for OT ADL bathing/dressing session. Pt asleep in w/c upon arrival, easily awoken and pleasant. He completed bathing/dressing tasks seated in w/c at sink. Required environmental cuing and VCs to intiate tasks, however, pt with decreased attention throughout task, easily distracted by other items on the sink ledge. When presented with washclothe and multimodal cues to bathe himself he would wash sink ledge and fold wash cloth instead. Once task of donning shirt initiated by therapist, pt able to follow through to completion. He stood with min A and completed buttock hygiene/ pericare with VCs and increased time. Pt spent extensive time attempting to tie together drawstring in pants. He used B  UEs in order to complete task, demonstrating good attention to task, problem solving and initiation of L UE.  Upon finishing ADLs, pt standing to move about room, no awareness of environmental obstacles and difficult to redirect to safer and clearer path. Pt becoming slightly agitated with therapist when providing min A, increased time to be redirected.  Pt returned to chair at end of session, left with QRB donned and wife present.    Therapy Documentation Precautions:  Precautions Precautions: Fall Precaution Comments: Dementia Restrictions Weight Bearing Restrictions: No Pain:  No/ denies   See Function Navigator for Current Functional Status.   Therapy/Group: Individual Therapy  Lewis, Anaalicia Reimann C 11/05/2016, 7:07 AM

## 2016-11-05 NOTE — Progress Notes (Signed)
Social Work Patient ID: Kelly Lloyd, male   DOB: [birth year], 83 y.o.   MRN: [id number]  Met with wife to confirm plan for Monday. Medical paperwork has been faxed to the facility and wife to complete her share and take on Monday. They are moving some of pt's belongings-furniture and pictures in this weekend. Son will be here Monday to transport pt to the facility along with pt's wife. Will have packet for them on Monday along with prescriptions. Pt going to [facility] in [place]. Wife does want to ask PA-Dan some questions will ask Linna Hoff to stop in and see her today. Discharge set for Monday/

## 2016-11-05 NOTE — Progress Notes (Signed)
Physical Therapy Session Note  Patient Details  Name: Kelly Lloyd MRN: [id number] Date of Birth: 12/22/1927  Today's Date: 11/05/2016 PT Individual Time: 1100-1200 AND 1315-1400 PT Individual Time Calculation (min): 60 min  AND 45 min   Short Term Goals: Week 2:  PT Short Term Goal 1 (Week 2): STG=LTg due to ELOS  Skilled Therapeutic Interventions/Progress Updates:   Session 1.   Pt received sitting in WC and agreeable to PT with significant encouragement.   WC mobility instructed py BY through hall of hospital x 246f and 1583fwith BUE propulsion. [facility]   Gait on unlevel surface of cement side walk at hospital entrance 15035fnd 100f43fth min assist from PT min cues for safety awareness and avoidance f obstacles.   Throughout treatment pt instructed in sit<>stand transfers with min assist and moderate cues for improved anterior weight shift to improve success of Transfers.   Object identification and location in semi-familar environment with instruction to find RN station, rehab gym, and pt room. Pt required max cues for awareness for familiar area.   Patient returned too room and [facility] reach and all needs met.     Session 2.    Pt received sitting in WC and agreeable to PT with request to use restroom. Pt performed ambulatory transfer to toilet with min-supervision assist from PT with slight use of rail in restroom.  PT attempted to perform perineal care following bowel movement. Needed assist from PT to improve cleanliness. Pt noted to remove sacral dressing during toileting.   Gait in room with supervision assist to sink in order to wash hands as well as to find electric razor and perform shaving task. Pt required moderate multimodal cues to [facility] and allow PT to place quick release belt.   Ball toss to 1 of 2 targets with 50% use of LUE to throw ball. Pt noted to have  intermittent reactionary movements to catch ball with LUE.   [facility] present and all needs met.     Therapy Documentation Precautions:  Precautions Precautions: Fall Precaution Comments: Dementia Restrictions Weight Bearing Restrictions: No Vital Signs: Therapy Vitals Temp: 98.7 F (37.1 C) Temp Source: Oral Pulse Rate: 85 Resp: 18 BP: (!) 109/59 Patient Position (if appropriate): Sitting Oxygen Therapy SpO2: 96 % O2 Device: Not Delivered Pain: Pain Assessment Faces Pain Scale: No hurt   See Function Navigator for Current Functional Status.   Therapy/Group: Individual Therapy  AustLorie Phenix0/2018, 2:19 PM

## 2016-11-05 NOTE — Discharge Summary (Signed)
NAME:  Kelly Lloyd, RECORD NO.:  [id number]  MEDICAL RECORD NO.:  [id number]  LOCATION:                                 FACILITY:  PHYSICIAN:  Charlett Blake, M.D.   DATE OF BIRTH:  DATE OF ADMISSION:  10/20/2016 DATE OF DISCHARGE:  11/08/2016                              DISCHARGE SUMMARY   DISCHARGE DIAGNOSES: 1. Right MCA infarct. 2. DVT prophylaxis with Eliquis. 3. History of dementia. 4. Atrial flutter. 5. Benign prostatic hyperplasia.  HISTORY OF PRESENT ILLNESS:  This is an 83 year old right-handed male with history of atrial flutter with ablation in 2013, dementia, maintained on Aricept and Namenda.  Lives with spouse.  Reported to be independent prior to admission.  At baseline, was able to feed himself and wash.  Presented on October 17, 2016, after being found down with left- sided weakness.  Presented to [facility] with CT of the brain showing no acute intracranial process.  TPA was administered.  Transferred to [facility] for further evaluation.  MRI reviewed showed multiple right scattered acute infarcts in the right MCA territory most confluent about the upper central sulcus where there was petechial hemorrhage.  CT angiogram of the head showed no large vessel occlusion.  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  No branch vessel occlusion.  Echocardiogram with ejection fraction of 70%.  Carotid Doppler of right ICA demonstrated moderate amount of smooth homogeneous plaque suggestive of 60-79% stenosis.  Neurology consulted.  Maintained initially on subcutaneous Lovenox for DVT prophylaxis.  Later with initiation of Eliquis with history of atrial flutter and CVA prophylaxis.  Tolerating a regular diet.  Vascular Surgery was consulted in regard to right carotid stenosis.  Did not feel the patient was a candidate for carotid surgery due to advanced dementia.  The patient was admitted for a  comprehensive rehab program.  PAST MEDICAL HISTORY:  See discharge diagnoses.  SOCIAL HISTORY:  Lives with spouse.  Functional status upon admission to [place] was max assist, sit to stand; max assist, supine to sit; max total assist, activities of daily living.  PHYSICAL EXAMINATION:  VITAL SIGNS:  Blood pressure 106/36, pulse 77, temperature 98, respirations 19. GENERAL:  This was an alert male.  He made eye contact with examiner. He had some spontaneous speech, which was garbled.  Followed commands, inconsistency.  Left side inattention.  Noted left-sided weakness.  EOMs intact. NECK:  Supple, nontender.  No JVD. CARDIAC:  Rate controlled. ABDOMEN:  Soft, nontender.  Good bowel sounds. LUNGS:  Clear to auscultation without wheeze.  REHABILITATION HOSPITAL COURSE:  The patient was admitted to Inpatient Rehab Services with therapies initiated on a 3-hour daily basis consisting of physical therapy, occupational therapy, speech therapy, and rehabilitation nursing.  The following issues were addressed during the patient's rehabilitation stay.  Pertaining to Mr. Lackman's right MCA infarct remained stable, he would follow Neurology Services.  Remained on Eliquis for CVA prophylaxis in light of history of atrial flutter. Noted advanced dementia.  He continued on Namenda as well as Aricept with scheduled Risperdal daily.  Blood pressures overall controlled.  He did have  a history of BPH, maintained on Avodart.  Hemoglobin remained stable.  No bleeding episodes.  Diet was advanced to a regular consistency.  The patient received weekly collaborative interdisciplinary team conferences to discuss estimated length of stay, family teaching, any barriers to his discharge.  He could propel his wheelchair with supervision, working with dynamic gait training.  Needed redirection to focus to the left side, activities of daily living, and homemaking.  Again, needed cues to attend to the left  side.  Completed upper and lower body bathing and dressing from wheelchair level at sink side.  He would request simple needs.  Due to his advanced dementia, wife did not feel she could provide the necessary assistance at home. Discharge arrangements were made for a memory [facility] unit at [facility].  Discharge taking place, November 08, 2016.  DISCHARGE MEDICATIONS:  Included: 1. Eliquis 5 mg p.o. b.i.d. 2. Aricept 10 mg p.o. daily. 3. Avodart 0.5 mg p.o. daily. 4. Namenda 10 mg p.o. b.i.d. 5. Risperdal 0.5 mg p.o. daily.  DIET:  His diet was regular.  FOLLOWUP:  The patient would follow up Dr. Alysia Penna at the outpatient rehab center as directed; Dr. Erlinda Hong, Neurology Services; call for appointment, Dr. Netty Starring, Medical Management.     Lauraine Rinne, P.A.   ______________________________ Charlett Blake, M.D.    DA/MEDQ  D:  11/04/2016  T:  11/05/2016  Job:  [id number]  cc:   Dr. Ethel Rana, M.D. Dr. Netty Starring

## 2016-11-06 ENCOUNTER — Inpatient Hospital Stay (HOSPITAL_COMMUNITY): Payer: Medicare Other

## 2016-11-06 ENCOUNTER — Inpatient Hospital Stay (HOSPITAL_COMMUNITY): Payer: Medicare Other | Admitting: Occupational Therapy

## 2016-11-07 ENCOUNTER — Inpatient Hospital Stay (HOSPITAL_COMMUNITY): Payer: Medicare Other | Admitting: Occupational Therapy

## 2016-11-08 ENCOUNTER — Inpatient Hospital Stay (HOSPITAL_COMMUNITY): Payer: Medicare Other | Admitting: Occupational Therapy

## 2016-11-08 ENCOUNTER — Inpatient Hospital Stay (HOSPITAL_COMMUNITY): Payer: Medicare Other | Admitting: Physical Therapy

## 2016-11-08 ENCOUNTER — Encounter (HOSPITAL_COMMUNITY): Payer: Self-pay

## 2016-11-08 LAB — CBC
HCT: 40 % (ref 39.0–52.0)
Hemoglobin: 12.9 g/dL — ABNORMAL LOW (ref 13.0–17.0)
MCH: 31.2 pg (ref 26.0–34.0)
MCHC: 32.3 g/dL (ref 30.0–36.0)
MCV: 96.6 fL (ref 78.0–100.0)
Platelets: 325 10*3/uL (ref 150–400)
RBC: 4.14 MIL/uL — ABNORMAL LOW (ref 4.22–5.81)
RDW: 12.8 % (ref 11.5–15.5)
WBC: 10.1 10*3/uL (ref 4.0–10.5)

## 2016-11-08 MED ORDER — DONEPEZIL HCL 10 MG PO TABS: 10.0000 mg | ORAL_TABLET | Freq: Every day | ORAL | 0 refills | Status: AC

## 2016-11-08 MED ORDER — DUTASTERIDE 0.5 MG PO CAPS: 0.5000 mg | ORAL_CAPSULE | Freq: Every day | ORAL | 0 refills | Status: DC

## 2016-11-08 MED ORDER — RISPERIDONE 0.5 MG PO TABS: 0.5000 mg | ORAL_TABLET | Freq: Every day | ORAL | 0 refills | Status: DC

## 2016-11-08 MED ORDER — MEMANTINE HCL 10 MG PO TABS: 10.0000 mg | ORAL_TABLET | Freq: Two times a day (BID) | ORAL | 0 refills | Status: AC

## 2016-11-08 MED ORDER — APIXABAN 5 MG PO TABS: 5.0000 mg | ORAL_TABLET | Freq: Two times a day (BID) | ORAL | 0 refills | Status: AC

## 2016-11-08 NOTE — Progress Notes (Signed)
Occupational Therapy Session Note  Patient Details  Name: Kelly Lloyd MRN: [id number] Date of Birth: 1927-11-02  Today's Date: 11/08/2016 OT Individual Time: 0930-1000 OT Individual Time Calculation (min): 30 min    Short Term Goals: Week 3:  OT Short Term Goal 1 (Week 3): Cont working towards LTG of overall min A  Skilled Therapeutic Interventions/Progress Updates:    Pt seen for OT session focusing on ADL re-training and mobility. Pt sitting up in w/c upon arrival, already bathed and dresed but pleasant and willing to work with therapist. Pt with difficulty following multimodal cuing to have him stand and ambulate to sink, therefore remained and sink and completed grooming tasks from w/c level at sink with assist for set-up of items needed to initiate tasks. Pt using L UE sporadically throughout tasks at gross assist level. He asked for assist for managing draw string on pants and donning socks after attempting to use L UE to assist with task.  He ambulated into bathroom with hand held assist, able to manage clothing with steadying assist. Pt left seated on toilet with hand off to PT.   Therapy Documentation Precautions:  Precautions Precautions: Fall Precaution Comments: Dementia Restrictions Weight Bearing Restrictions: No Pain:    No/ denies pain  See Function Navigator for Current Functional Status.   Therapy/Group: Individual Therapy  Lewis, Gem Conkle C 11/08/2016, 7:14 AM

## 2016-11-08 NOTE — Progress Notes (Signed)
Physical Therapy Session Note  Patient Details  Name: Kelly Lloyd MRN: [id number] Date of Birth: [birth year]  Today's Date: 11/08/2016 PT Individual Time: 1000-1030 PT Individual Time Calculation (min): 30 min   Short Term Goals: Week 2:  PT Short Term Goal 1 (Week 2): STG=LTg due to ELOS  Skilled Therapeutic Interventions/Progress Updates: Pt presented hand off from OT at toilet. Pt performed peri hygiene with verbal cues for placement of soiled toilet paper. Sit to stand with min guard and pt required minA today for pulling up brief and pants. Pt agreeable with encouragement for ambulation to otho gym with HHA and minA  and participated in car transfer with min guard. Pt refused any additional functional activities including bed mobilly, stairs, and picking up objects. Once returned to room pt sat in w/c with min guard and verbal cues and performed w/c mobility with encouragment. Pt propelled w/c x 29ft with supervision and verbal cues. Pt remained in w/c at end of session with nsg and family present.      Therapy Documentation Precautions:  Precautions Precautions: Fall Precaution Comments: Dementia Restrictions Weight Bearing Restrictions: No General:   Vital Signs:   Pain:   Mobility: Bed Mobility Bed Mobility: Rolling Right;Rolling Left;Supine to Sit;Sit to Supine Rolling Right: 4: Min assist Supine to Sit: 4: Min assist Sit to Supine: 3: Mod assist (due to cognition) Transfers Transfers: Yes Sit to Stand: 4: Min guard Stand Pivot Transfers: 4: Min assist Locomotion : Ambulation Ambulation: Yes Ambulation/Gait Assistance: 4: Min assist Ambulation Distance (Feet): 200 Feet Assistive device: 1 person hand held assist Gait Gait: Yes Gait Pattern: Impaired Gait Pattern: Narrow base of support;Ataxic Stairs / Additional Locomotion Stairs: No Architect: Yes Wheelchair Assistance: 5: Careers information officer: Both lower  extermities Wheelchair Parts Management: Needs assistance (decreased cognition)  Trunk/Postural Assessment : Cervical Assessment Cervical Assessment: Exceptions to [facility] (forward head) Thoracic Assessment Thoracic Assessment: Exceptions to [facility] (rounded shoulders) Lumbar Assessment Lumbar Assessment: Exceptions to [facility] (posterior pelvic tilt) Postural Control Postural Control: Deficits on evaluation  Balance: Dynamic Sitting Balance Sitting balance - Comments: min guard during functional activitiy Dynamic Standing Balance Dynamic Standing - Balance Support: During functional activity Dynamic Standing - Level of Assistance: 5: Stand by assistance;4: Min assist (varied due to decreased awarenss) Exercises:   Other Treatments:     See Function Navigator for Current Functional Status.   Therapy/Group: Individual Therapy  Annete Ayuso  Jeydan Barner, PTA  11/08/2016, 12:29 PM

## 2016-11-08 NOTE — NC FL2 (Signed)
[place] MEDICAID FL2 LEVEL OF CARE SCREENING TOOL     IDENTIFICATION  Patient Name: Kelly Lloyd Birthdate: [birth year] Sex: male Admission Date (Current Location): 10/21/2016  [facility] and [place] Number:  Engineering geologist and Address:  The [place]. [facility], [place] [address], [place], [place] [address]      Provider Number: [id number]  Attending Physician Name and Address:  No att. providers found  Relative Name and Phone Number:  [name]-wife [phone]-cell    Current Level of Care: Other (Comment) (rehab) Recommended Level of Care: [place] Prior Approval Number:    Date Approved/Denied:   PASRR Number: [id number]  Discharge Plan: Domiciliary (Rest home) (ALF)    Current Diagnoses: Patient Active Problem List   Diagnosis Date Noted  . Right middle cerebral artery stroke ([place]) 10/21/2016  . Atrial flutter ([place])   . 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 Weight loss 10/18/2016  . Paroxysmal atrial fibrillation ([place]) 10/18/2016  . Status post catheter ablation of atrial fibrillation 10/18/2016  . Alzheimer's disease 10/18/2016  . Acute ischemic stroke (HCC) - R MCA scattered infarcts, unknown etiology 10/17/2016  . Anal stricture 04/17/2014  . Rectal pain 04/17/2014  . Unspecified constipation 08/30/2012  . Medicare annual wellness visit, initial 01/19/2012  . Medically noncompliant 01/19/2012  . Post-nasal drip 01/19/2012  . Hemorrhoid 06/24/2011  . PENILE LESION 02/12/2010  . 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 HYPERCHOLESTEROLEMIA 02/10/2010  . Memory loss 02/05/2009  . DIVERTICULOSIS, COLON 12/15/2006  . BENIGN PROSTATIC HYPERTROPHY 12/15/2006  . OSTEOARTHRITIS, GENERALIZED, MULTIPLE JOINTS 12/15/2006    Orientation RESPIRATION BLADDER Height & Weight     Self, Place  Normal Incontinent (timed toileting - every 2 hours) Weight: 80.1 kg (176 lb 8 oz) Height:  5'  11" (180.3 cm)  BEHAVIORAL SYMPTOMS/MOOD NEUROLOGICAL BOWEL NUTRITION STATUS      Continent Diet (regular)  AMBULATORY STATUS COMMUNICATION OF NEEDS Skin   Limited Assist Non-Verbally PU Stage and Appropriate Care   PU Stage 2 Dressing:  (generous amount of barrier cream to coccyx area as needed)                   Personal Care Assistance Level of Assistance  Bathing, Dressing Bathing Assistance: Limited assistance   Dressing Assistance: Limited assistance     Functional Limitations Info  Speech     Speech Info: Impaired    SPECIAL CARE FACTORS FREQUENCY  PT (By licensed PT), OT (By licensed OT), Bowel and bladder program     PT Frequency: 5x/week OT Frequency: 5x/week Bowel and Bladder Program Frequency: Timed toileting program - every 2 hours toilet patient          Contractures Contractures Info: Not present    Additional Factors Info  Code Status Code Status Info: Full Code             Current Medications (11/08/2016):  This is the current hospital active medication list Current Facility-Administered Medications  Medication Dose Route Frequency Provider Last Rate Last Dose  . acetaminophen (TYLENOL) tablet 650 mg  650 mg Oral Q4H PRN Cathlyn Parsons, PA-C   650 mg at 11/01/16 2318   Or  . acetaminophen (TYLENOL) solution 650 mg  650 mg Per Tube Q4H PRN Angiulli, Lavon Paganini, PA-C       Or  . acetaminophen (TYLENOL) suppository 650 mg  650 mg Rectal Q4H PRN Angiulli, Lavon Paganini, PA-C      .  apixaban (ELIQUIS) tablet 5 mg  5 mg Oral BID Cathlyn Parsons, PA-C   5 mg at 11/08/16 0847  . donepezil (ARICEPT) tablet 10 mg  10 mg Oral Daily Kirsteins, Luanna Salk, MD   10 mg at 11/08/16 0847  . dutasteride (AVODART) capsule 0.5 mg  0.5 mg Oral Daily Cathlyn Parsons, PA-C   0.5 mg at 11/07/16 2100  . feeding supplement (ENSURE ENLIVE) (ENSURE ENLIVE) liquid 237 mL  237 mL Oral TID BM AngiulliLavon Paganini, PA-C   237 mL at 11/08/16 0850  . memantine (NAMENDA) tablet  10 mg  10 mg Oral BID Cathlyn Parsons, PA-C   10 mg at 11/08/16 0847  . ondansetron (ZOFRAN) tablet 4 mg  4 mg Oral Q6H PRN Angiulli, Lavon Paganini, PA-C       Or  . ondansetron [facility]) injection 4 mg  4 mg Intravenous Q6H PRN Angiulli, Lavon Paganini, PA-C      . risperiDONE (RISPERDAL) tablet 0.25 mg  0.25 mg Oral QHS PRN Meredith Staggers, MD   0.25 mg at 11/07/16 0140  . risperiDONE (RISPERDAL) tablet 0.5 mg  0.5 mg Oral Daily Alger Simons T, MD   0.5 mg at 11/07/16 1924  . sorbitol 70 % solution 30 mL  30 mL Oral Daily PRN Angiulli, Lavon Paganini, PA-C       Current Outpatient Prescriptions  Medication Sig Dispense Refill  . apixaban (ELIQUIS) 5 MG TABS tablet Take 1 tablet (5 mg total) by mouth 2 (two) times daily. 60 tablet 0  . donepezil (ARICEPT) 10 MG tablet Take 1 tablet (10 mg total) by mouth daily. 30 tablet 0  . dutasteride (AVODART) 0.5 MG capsule Take 1 capsule (0.5 mg total) by mouth daily. 30 capsule 0  . memantine (NAMENDA) 10 MG tablet Take 1 tablet (10 mg total) by mouth 2 (two) times daily. 60 tablet 0  . risperiDONE (RISPERDAL) 0.5 MG tablet Take 1 tablet (0.5 mg total) by mouth daily. 30 tablet 0     Discharge Medications: Please see discharge summary for a list of discharge medications.  Relevant Imaging Results:  Relevant Lab Results:   Additional Information Pt does have dementia but is making progress in therapies from his stroke  Rhina Kramme, Silvestre Mesi, LCSW

## 2016-11-08 NOTE — Progress Notes (Signed)
Physical Therapy Discharge Summary  Patient Details  Name: Kelly Lloyd MRN: [id number] Date of Birth: 1928-01-15  Today's Date: 11/08/2016 PT Individual Time: 1000-1030 PT Individual Time Calculation (min): 30 min    Patient has met 8 of 10 long term goals due to improved activity tolerance, improved balance, improved postural control, increased strength, improved awareness and improved coordination.  Patient to discharge at an ambulatory level [place].   Patient's care partner Pt is being d/c to a memory care unit in ALF where he will have 24 hour supervision. to provide the necessary mental and physical  assistance at discharge.  Reasons goals not met: Pt refused participation in stair training, however pt is being d/c to ALF with memory care unit where stairs not applicable.  Recommendation:  Patient will benefit from ongoing skilled PT services in skilled nursing facility setting to continue to advance safe functional mobility, address ongoing impairments in balance, safety, gait, awareness, and minimize fall risk.  Equipment: No equipment provided  Reasons for discharge: treatment goals met and discharge from hospital  Patient/family agrees with progress made and goals achieved: Yes  PT Discharge Precautions/Restrictions Restrictions Weight Bearing Restrictions: No  Cognition Orientation Level: Oriented to person  Mobility Bed Mobility Bed Mobility: Rolling Right;Rolling Left;Supine to Sit;Sit to Supine Rolling Right: 4: Min assist Supine to Sit: 4: Min assist Sit to Supine: 3: Mod assist (due to cognition) Transfers Transfers: Yes Sit to Stand: 4: Min guard Stand Pivot Transfers: 4: Min assist Locomotion  Ambulation Ambulation: Yes Ambulation/Gait Assistance: 4: Min assist Ambulation Distance (Feet): 200 Feet Assistive device: 1 person hand held assist Gait Gait: Yes Gait Pattern: Impaired Gait Pattern: Narrow base of support;Ataxic Stairs / Additional  Locomotion Stairs: No Architect: Yes Wheelchair Assistance: 5: Careers information officer: Both lower extermities Wheelchair Parts Management: Needs assistance (decreased cognition)  Trunk/Postural Assessment  Cervical Assessment Cervical Assessment: Exceptions to [facility] (forward head) Thoracic Assessment Thoracic Assessment: Exceptions to [facility] (rounded shoulders) Lumbar Assessment Lumbar Assessment: Exceptions to [facility] (posterior pelvic tilt) Postural Control Postural Control: Deficits on evaluation  Balance Dynamic Sitting Balance Sitting balance - Comments: min guard during functional activitiy Dynamic Standing Balance Dynamic Standing - Balance Support: During functional activity Dynamic Standing - Level of Assistance: 5: Stand by assistance;4: Min assist (varied due to decreased awarenss) Extremity Assessment      RLE Assessment RLE Assessment: Within Functional Limits LLE Assessment LLE Assessment: Exceptions to [facility] (grossly 4/5)   See Function Navigator for Current Functional Status.  Kelly Lloyd 11/08/2016, 12:29 PM

## 2016-11-08 NOTE — Progress Notes (Signed)
[place] PHYSICAL MEDICINE & REHABILITATION     PROGRESS NOTE    Subjective/Complaints: No issues overnite  ROS: limited by cognition   Objective: Vital Signs: Blood pressure 122/60, pulse 80, temperature 98.5 F (36.9 C), temperature source Oral, resp. rate 18, height 5\' 11"  (1.803 m), weight 80.1 kg (176 lb 8 oz), SpO2 96 %. No results found. No results for input(s): WBC, HGB, HCT, PLT in the last 72 hours. No results for input(s): NA, K, CL, GLUCOSE, BUN, CREATININE, CALCIUM in the last 72 hours.  Invalid input(s): CO CBG (last 3)  No results for input(s): GLUCAP in the last 72 hours.  Wt Readings from Last 3 Encounters:  10/27/16 80.1 kg (176 lb 8 oz)  10/17/16 80.7 kg (177 lb 14.6 oz)  10/17/16 63.2 kg (139 lb 5.3 oz)    Physical Exam:   Gen: restless.  HENT:  Head: Normocephalic.  Eyes: EOMare normal.  Neck: Normal range of motion. Neck supple. No thyromegalypresent.  Cardiovascular: RRR without murmur. No JVD    Respiratory: CTA Bilaterally without wheezes or rales. Normal effort   GI: Soft. Bowel sounds are normal. He exhibits no distension.  Skin. Warm and dry Musculoskeletal: He exhibits no edemaor tenderness. No pain with Left shoulder ROM Neurological:    Makes eye contact and can follow simple commands. Can answer his name, Kelly Lloyd  Difficult to perform muscle testing, does have left upper extremity weakness but able to move antigravity spontaneously  Psych: Aphasic, confused but pleasant  Assessment/Plan: 1. Functional deficits  secondary to right MCA infarct Stable for D/C today F/u PCP in 3-4 weeks F/u PM&R 2 weeks See D/C summary See D/C instructions.  Function:  Bathing Bathing position Bathing activity did not occur: Refused Position: Bed  Bathing parts Body parts bathed by patient: Abdomen, Left arm, Right arm, Front perineal area, Chest Body parts bathed by helper: Buttocks, Right upper leg, Left upper leg, Right lower leg, Left  lower leg, Back  Bathing assist Assist Level: Touching or steadying assistance(Pt > 75%)      Upper Body Dressing/Undressing Upper body dressing Upper body dressing/undressing activity did not occur: Refused What is the patient wearing?: Pull over shirt/dress     Pull over shirt/dress - Perfomed by patient: Put head through opening, Pull shirt over trunk Pull over shirt/dress - Perfomed by helper: Thread/unthread right sleeve        Upper body assist Assist Level: Touching or steadying assistance(Pt > 75%)      Lower Body Dressing/Undressing Lower body dressing   What is the patient wearing?: Pants Underwear - Performed by patient: Pull underwear up/down Underwear - Performed by helper: Thread/unthread right underwear leg, Thread/unthread left underwear leg Pants- Performed by patient: Pull pants up/down, Fasten/unfasten pants Pants- Performed by helper: Thread/unthread left pants leg, Thread/unthread right pants leg   Non-skid slipper socks- Performed by helper: Don/doff right sock, Don/doff left sock Socks - Performed by patient: Don/doff right sock, Don/doff left sock Socks - Performed by helper: Don/doff right sock, Don/doff left sock Shoes - Performed by patient: Don/doff right shoe, Don/doff left shoe Shoes - Performed by helper: Don/doff right shoe, Don/doff left shoe          Lower body assist Assist for lower body dressing: Touching or steadying assistance (Pt > 75%)      Toileting Toileting Toileting activity did not occur: No continent bowel/bladder event Toileting steps completed by patient: Adjust clothing prior to toileting, Performs perineal hygiene Toileting steps completed by  helper: Adjust clothing prior to toileting, Performs perineal hygiene, Adjust clothing after toileting Toileting Assistive Devices: Toilet aid  Toileting assist Assist level: Touching or steadying assistance (Pt.75%)   Transfers Chair/bed transfer   Chair/bed transfer method:  Ambulatory Chair/bed transfer assist level: Touching or steadying assistance (Pt > 75%) Chair/bed transfer assistive device: Armrests     Locomotion Ambulation     Max distance: 139ft  Assist level: Touching or steadying assistance (Pt > 75%)   Wheelchair Wheelchair activity did not occur: Refused Type: Manual Max wheelchair distance: 262ft  Assist Level: Supervision or verbal cues  Cognition Comprehension Comprehension assist level: Understands basic 25 - 49% of the time/ requires cueing 50 - 75% of the time  Expression Expression assist level: Expresses basic 25 - 49% of the time/requires cueing 50 - 75% of the time. Uses single words/gestures.  Social Interaction Social Interaction assist level: Interacts appropriately less than 25% of the time. May be withdrawn or combative.  Problem Solving Problem solving assist level: Solves basic less than 25% of the time - needs direction nearly all the time or does not effectively solve problems and may need a restraint for safety  Memory Memory assist level: Recognizes or recalls 25 - 49% of the time/requires cueing 50 - 75% of the time   Medical Problem List and Plan: 1. Left side weaknesssecondary to right MCA infarct -continue PT, OT, SLP, plan is to memory care unit today.2. DVT Prophylaxis/Anticoagulation:  Eliquis, no evidence of bleeding. Monitor platelet counts and any signs of bleeding 3. Pain Management: Tylenol 4. Mood/cognition: Namenda 10 mg twice a day, Aricept 10 mg daily at bedtime -continue risperdal 0.5mg  at 8pm for sundowning-good results so far  -low bed  -restraints still required for patient safety  -ua neg, ucx with multispecies--- repeat cath urine. Negative 5. Neuropsych: This patient isnot capable of making decisions on hisown behalf. 6. Skin/Wound Care: 2 stage 2 areas  Sup aspect of gluteal cleft and Left upper sacral area no drainage or odor- foam dressing 7.  Fluids/Electrolytes/Nutrition: Routine I&O with follow-up chemistries reviewed - good meal intake 8.Right carotid stenosis. Follow-up vascular surgery. Not a surgical candidate at this time 9.Atrial flutter. Ablation 2013. Cardiac rate controlled -hgb normal , eliquis , was started on 10/27/2016, hemoglobin stable at 12.8 on 10/30/2016, recheck prior to D/C 10. BPH. Avodart 0.5 mg daily.     LOS (Days) 18 A FACE TO FACE EVALUATION WAS PERFORMED  Charlett Blake, MD 11/08/2016 8:03 AM

## 2016-11-08 NOTE — NC FL2 (Deleted)
[place] MEDICAID FL2 LEVEL OF CARE SCREENING TOOL     IDENTIFICATION  Patient Name: Kelly Lloyd Birthdate: [birth year] Sex: male Admission Date (Current Location): 10/21/2016  [facility] and [place] Number:  Engineering geologist and Address:  The [place]. [facility], [place] [address], [place], [place] [address]      Provider Number: [id number]  Attending Physician Name and Address:  No att. providers found  Relative Name and Phone Number:  [name]-wife [phone]-cell    Current Level of Care: Other (Comment) (rehab) Recommended Level of Care: [place] Prior Approval Number:    Date Approved/Denied:   PASRR Number: [id number]  Discharge Plan: Domiciliary (Rest home)    Current Diagnoses: Patient Active Problem List   Diagnosis Date Noted  . Right middle cerebral artery stroke ([place]) 10/21/2016  . Atrial flutter ([place])   . 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 Weight loss 10/18/2016  . Paroxysmal atrial fibrillation ([place]) 10/18/2016  . Status post catheter ablation of atrial fibrillation 10/18/2016  . Alzheimer's disease 10/18/2016  . Acute ischemic stroke (HCC) - R MCA scattered infarcts, unknown etiology 10/17/2016  . Anal stricture 04/17/2014  . Rectal pain 04/17/2014  . Unspecified constipation 08/30/2012  . Medicare annual wellness visit, initial 01/19/2012  . Medically noncompliant 01/19/2012  . Post-nasal drip 01/19/2012  . Hemorrhoid 06/24/2011  . PENILE LESION 02/12/2010  . 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 HYPERCHOLESTEROLEMIA 02/10/2010  . Memory loss 02/05/2009  . DIVERTICULOSIS, COLON 12/15/2006  . BENIGN PROSTATIC HYPERTROPHY 12/15/2006  . OSTEOARTHRITIS, GENERALIZED, MULTIPLE JOINTS 12/15/2006    Orientation RESPIRATION BLADDER Height & Weight     Self, Place  Normal Incontinent (Timed tolieting-every 2-3 hours) Weight: 80.1 kg (176 lb 8 oz) Height:  5\' 11"   (180.3 cm)  BEHAVIORAL SYMPTOMS/MOOD NEUROLOGICAL BOWEL NUTRITION STATUS      Continent Diet (Regular diet)  AMBULATORY STATUS COMMUNICATION OF NEEDS Skin   Limited Assist Non-Verbally PU Stage and Appropriate Care   PU Stage 2 Dressing: No Dressing                   Personal Care Assistance Level of Assistance  Bathing, Dressing Bathing Assistance: Limited assistance   Dressing Assistance: Limited assistance     Functional Limitations Info  Speech          SPECIAL CARE FACTORS FREQUENCY  PT (By licensed PT), OT (By licensed OT), Bowel and bladder program     PT Frequency: 5x/week OT Frequency: 5x/week Bowel and Bladder Program Frequency: Timed toileting program - every 2 hours toilet patient          Contractures Contractures Info: Not present    Additional Factors Info  Code Status Code Status Info: Full Code             Current Medications (11/08/2016):  This is the current hospital active medication list Current Facility-Administered Medications  Medication Dose Route Frequency Provider Last Rate Last Dose  . acetaminophen (TYLENOL) tablet 650 mg  650 mg Oral Q4H PRN Cathlyn Parsons, PA-C   650 mg at 11/01/16 2318   Or  . acetaminophen (TYLENOL) solution 650 mg  650 mg Per Tube Q4H PRN Angiulli, Lavon Paganini, PA-C       Or  . acetaminophen (TYLENOL) suppository 650 mg  650 mg Rectal Q4H PRN Angiulli, Lavon Paganini, PA-C      . apixaban (ELIQUIS) tablet 5 mg  5  mg Oral BID Cathlyn Parsons, PA-C   5 mg at 11/08/16 0847  . donepezil (ARICEPT) tablet 10 mg  10 mg Oral Daily Kirsteins, Luanna Salk, MD   10 mg at 11/08/16 0847  . dutasteride (AVODART) capsule 0.5 mg  0.5 mg Oral Daily Cathlyn Parsons, PA-C   0.5 mg at 11/07/16 2100  . feeding supplement (ENSURE ENLIVE) (ENSURE ENLIVE) liquid 237 mL  237 mL Oral TID BM AngiulliLavon Paganini, PA-C   237 mL at 11/08/16 0850  . memantine (NAMENDA) tablet 10 mg  10 mg Oral BID Cathlyn Parsons, PA-C   10 mg at 11/08/16  0847  . ondansetron (ZOFRAN) tablet 4 mg  4 mg Oral Q6H PRN Angiulli, Lavon Paganini, PA-C       Or  . ondansetron [facility]) injection 4 mg  4 mg Intravenous Q6H PRN Angiulli, Lavon Paganini, PA-C      . risperiDONE (RISPERDAL) tablet 0.25 mg  0.25 mg Oral QHS PRN Meredith Staggers, MD   0.25 mg at 11/07/16 0140  . risperiDONE (RISPERDAL) tablet 0.5 mg  0.5 mg Oral Daily Alger Simons T, MD   0.5 mg at 11/07/16 1924  . sorbitol 70 % solution 30 mL  30 mL Oral Daily PRN Angiulli, Lavon Paganini, PA-C       Current Outpatient Prescriptions  Medication Sig Dispense Refill  . apixaban (ELIQUIS) 5 MG TABS tablet Take 1 tablet (5 mg total) by mouth 2 (two) times daily. 60 tablet 0  . donepezil (ARICEPT) 10 MG tablet Take 1 tablet (10 mg total) by mouth daily. 30 tablet 0  . dutasteride (AVODART) 0.5 MG capsule Take 1 capsule (0.5 mg total) by mouth daily. 30 capsule 0  . memantine (NAMENDA) 10 MG tablet Take 1 tablet (10 mg total) by mouth 2 (two) times daily. 60 tablet 0  . risperiDONE (RISPERDAL) 0.5 MG tablet Take 1 tablet (0.5 mg total) by mouth daily. 30 tablet 0     Discharge Medications: Please see discharge summary for a list of discharge medications.  Relevant Imaging Results:  Relevant Lab Results:   Additional Information Pt does have dementia but is making progress in therapies from his stroke  Tyshan Enderle, Silvestre Mesi, LCSW

## 2016-11-08 NOTE — Discharge Instructions (Signed)
Inpatient Rehab Discharge Instructions  Kelly Lloyd Discharge date and time: 11/08/16   Activities/Precautions/ Functional Status: Activity: activity as tolerated Diet: regular diet Wound Care: none needed Functional status:  ___ No restrictions     ___ Walk up steps independently _X__ 24/7 supervision/assistance   ___ Walk up steps with assistance ___ Intermittent supervision/assistance  ___ Bathe/dress independently ___ Walk with walker     _x__ Bathe/dress with assistance ___ Walk Independently    ___ Shower independently ___ Walk with assistance    ___ Shower with assistance _X__ No alcohol     ___ Return to work/school ________     STROKE/TIA DISCHARGE INSTRUCTIONS SMOKING Cigarette smoking nearly doubles your risk of having a stroke & is the single most alterable risk factor  If you smoke or have smoked in the last 12 months, you are advised to quit smoking for your health.  Most of the excess cardiovascular risk related to smoking disappears within a year of stopping.  Ask you doctor about anti-smoking medications  [place] Quit Line: 1-800-QUIT NOW  Free Smoking Cessation Classes [phone]  CHOLESTEROL Know your levels; limit fat & cholesterol in your diet  Lipid Panel     Component Value Date/Time   CHOL 136 10/18/2016 0230   TRIG 63 10/18/2016 0230   HDL 57 10/18/2016 0230   CHOLHDL 2.4 10/18/2016 0230   VLDL 13 10/18/2016 0230   LDLCALC 66 10/18/2016 0230      Many patients benefit from treatment even if their cholesterol is at goal.  Goal: Total Cholesterol (CHOL) less than 160  Goal:  Triglycerides (TRIG) less than 150  Goal:  HDL greater than 40  Goal:  LDL (LDLCALC) less than 100   BLOOD PRESSURE American Stroke Association blood pressure target is less that 120/80 mm/Hg  Your discharge blood pressure is:  BP: (!) 142/58  Monitor your blood pressure  Limit your salt and alcohol intake  Many individuals will require more than one medication  for high blood pressure  DIABETES (A1c is a blood sugar average for last 3 months) Goal HGBA1c is under 7% (HBGA1c is blood sugar average for last 3 months)  Diabetes: No known diagnosis of diabetes    Lab Results  Component Value Date   HGBA1C 5.7 (H) 10/18/2016     Your HGBA1c can be lowered with medications, healthy diet, and exercise.  Check your blood sugar as directed by your physician  Call your physician if you experience unexplained or low blood sugars.  PHYSICAL ACTIVITY/REHABILITATION Goal is 30 minutes at least 4 days per week  Activity: Increase activity slowly, Therapies: Physical Therapy: Home Health Return to work: N/A  Activity decreases your risk of heart attack and stroke and makes your heart stronger.  It helps control your weight and blood pressure; helps you relax and can improve your mood.  Participate in a regular exercise program.  Talk with your doctor about the best form of exercise for you (dancing, walking, swimming, cycling).  DIET/WEIGHT Goal is to maintain a healthy weight  Your discharge diet is: Diet regular Room service appropriate? Yes; Fluid consistency: Thin  liquids Your height is:    Your current weight is: Weight: 80.1 kg (176 lb 8 oz) Your Body Mass Index (BMI) is:     Following the type of diet specifically designed for you will help prevent another stroke.  Your goal weight range is:    Your goal Body Mass Index (BMI) is 19-24.  Healthy food  habits can help reduce 3 risk factors for stroke:  High cholesterol, hypertension, and excess weight.  RESOURCES Stroke/Support Group:  Call [phone]   STROKE EDUCATION PROVIDED/REVIEWED AND GIVEN TO PATIENT Stroke warning signs and symptoms How to activate emergency medical system (call 911). Medications prescribed at discharge. Need for follow-up after discharge. Personal risk factors for stroke. Pneumonia vaccine given:  Flu vaccine given:  My questions have been answered, the writing  is legible, and I understand these instructions.  I will adhere to these goals & educational materials that have been provided to me after my discharge from the hospital.   Special Instructions:    My questions have been answered and I understand these instructions. I will adhere to these goals and the provided educational materials after my discharge from the hospital.  Patient/Caregiver Signature _______________________________ Date __________  Clinician Signature _______________________________________ Date __________  Please bring this form and your medication list with you to all your follow-up doctor's appointments.

## 2016-11-08 NOTE — Progress Notes (Signed)
Social Work Discharge Note  The overall goal for the admission was met for:   Discharge location: Yes - [facility] care in [place]  Length of Stay: Yes - 18 days  Discharge activity level: Yes - Supervision to min assist level  Home/community participation: Yes  Services provided included: MD, RD, PT, OT, SLP, RN, CM, Pharmacy and SW  Financial Services: Private Insurance: [facility] Medicare  Follow-up services arranged: Other: Will receive PT & OT at [place] (or additional information): Son to transport to [place].  Paperwork completed and packet given to family to take with them.  Patient/Family verbalized understanding of follow-up arrangements: Yes  Individual responsible for coordination of the follow-up plan: Bethena Roys - wife and Marlou Sa - son  Confirmed correct DME delivered: Trey Sailors 11/08/2016    Braxtyn Dorff, Silvestre Mesi

## 2016-11-08 NOTE — Progress Notes (Signed)
Occupational Therapy Discharge Summary  Patient Details  Name: Kelly Lloyd MRN: [id number] Date of Birth: 01-19-1928   Patient has met 10 of 10 long term goals due to improved activity tolerance, improved balance, postural control, ability to compensate for deficits, functional use of  LEFT upper extremity and improved coordination.  Patient to discharge at [facility] Assist level.  Patient's wife is unable to care for pt due to advanced dementia. Pt is d/Lloyd to assisted living/ memory care unit  Reasons goals not met: Pt has shown abilities to complete all basic ADLs at min A level, however, due to cognitive impairments from dementia, pt's varying levels of attention and awareness, he can require up to max- total A for tasks.   OT services in assisted living/ memory care unit to continue to advance functional skills in the area of BADL and Reduce care partner burden.  Equipment: Will be provided at next venue of care  Reasons for discharge: treatment goals met and discharge from hospital  Patient/family agrees with progress made and goals achieved: Yes  OT Discharge Precautions/Restrictions  Precautions Precautions: Fall Precaution Comments: Dementia Restrictions Weight Bearing Restrictions: No Vision Patient Visual Report: Other (comment) (Unable to formally assess due to cognition, however, appears to be [facility]) Vision Assessment?: No apparent visual deficits Eye Alignment: Within Functional Limits Visual Fields: No apparent deficits Additional Comments: L inattention, though improved since admission Praxis Praxis: Impaired Praxis Impairment Details: Ideomotor;Motor planning Cognition Overall Cognitive Status: History of cognitive impairments - at baseline Arousal/Alertness: Awake/alert Orientation Level: Oriented to person Attention: Focused Focused Attention: Impaired Focused Attention Impairment: Verbal basic;Functional basic Sustained Attention: Impaired Sustained  Attention Impairment: Verbal basic;Functional basic Memory: Impaired Memory Impairment: Storage deficit;Retrieval deficit;Decreased short term memory Awareness: Impaired Awareness Impairment: Intellectual impairment Problem Solving: Impaired Problem Solving Impairment: Verbal basic;Functional basic Behaviors: Impulsive;Restless Safety/Judgment: Impaired Comments: advanced dementia at baseline, family reports he is at his cognitive baseline  Sensation Sensation Light Touch: Impaired Detail Light Touch Impaired Details: Impaired LUE Proprioception: Impaired Detail Proprioception Impaired Details: Impaired LUE Coordination Gross Motor Movements are Fluid and Coordinated: No Fine Motor Movements are Fluid and Coordinated: No Coordination and Movement Description: L UE apraxia and ataxia Finger Nose Finger Test: Unable to assess due to cognitive deficits Motor  Motor Motor: Hemiplegia Motor - Discharge Observations: L Hemiplegia UE>LE Mobility  Bed Mobility Bed Mobility: Rolling Right;Rolling Left;Supine to Sit;Sit to Supine Rolling Right: 4: Min assist Supine to Sit: 4: Min assist Sit to Supine: 3: Mod assist (due to cognition) Transfers Sit to Stand: 4: Min guard  Trunk/Postural Assessment  Cervical Assessment Cervical Assessment: Exceptions to [facility] (Forward head) Thoracic Assessment Thoracic Assessment: Exceptions to [facility] (Kyphotic) Lumbar Assessment Lumbar Assessment: Exceptions to [facility] (Posterior pelvic tilt) Postural Control Postural Control: Deficits on evaluation  Balance Balance Balance Assessed: Yes Dynamic Sitting Balance Sitting balance - Comments: min guard during functional activitiy Static Standing Balance Static Standing - Balance Support: During functional activity Static Standing - Level of Assistance: 5: Stand by assistance;4: Min assist Dynamic Standing Balance Dynamic Standing - Balance Support: During functional activity Dynamic Standing - Level of  Assistance: 5: Stand by assistance;4: Min assist Extremity/Trunk Assessment RUE Assessment RUE Assessment: Within Functional Limits LUE Assessment LUE Assessment: Exceptions to [facility] (Unable to formally assess due to cognition. Pt initiates use during functional tasks at gross assist/ stabilization level with noted apraxia and ataxia)   See Function Navigator for Current Functional Status.  Kelly Lloyd, Kelly Lloyd 11/08/2016, 7:30 AM

## 2016-11-08 NOTE — Plan of Care (Signed)
Problem: RH BOWEL ELIMINATION Goal: RH STG MANAGE BOWEL WITH ASSISTANCE STG Manage Bowel with min Assistance.   Outcome: Not Met (add Reason) Pt discharged to Assisted living

## 2016-11-22 ENCOUNTER — Ambulatory Visit (HOSPITAL_BASED_OUTPATIENT_CLINIC_OR_DEPARTMENT_OTHER): Payer: Medicare Other | Admitting: Physical Medicine & Rehabilitation

## 2016-11-22 ENCOUNTER — Encounter: Payer: Medicare Other | Attending: Physical Medicine & Rehabilitation

## 2016-11-22 ENCOUNTER — Encounter: Payer: Self-pay | Admitting: Physical Medicine & Rehabilitation

## 2016-11-22 VITALS — BP 96/45 | HR 51 | Resp 14

## 2016-11-22 DIAGNOSIS — F0280 Dementia in other diseases classified elsewhere without behavioral disturbance: Secondary | ICD-10-CM

## 2016-11-22 DIAGNOSIS — G301 Alzheimer's disease with late onset: Secondary | ICD-10-CM | POA: Diagnosis not present

## 2016-11-22 DIAGNOSIS — I63511 Cerebral infarction due to unspecified occlusion or stenosis of right middle cerebral artery: Secondary | ICD-10-CM | POA: Diagnosis present

## 2016-11-22 DIAGNOSIS — Z8673 Personal history of transient ischemic attack (TIA), and cerebral infarction without residual deficits: Secondary | ICD-10-CM | POA: Diagnosis not present

## 2016-11-22 NOTE — Patient Instructions (Signed)
Stroke Prevention Some medical conditions and behaviors are associated with an increased chance of having a stroke. You may prevent a stroke by making healthy choices and managing medical conditions. How can I reduce my risk of having a stroke?  Stay physically active. Get at least 30 minutes of activity on most or all days.  Do not smoke. It may also be helpful to avoid exposure to secondhand smoke.  Limit alcohol use. Moderate alcohol use is considered to be:  No more than 2 drinks per day for men.  No more than 1 drink per day for nonpregnant women.  Eat healthy foods. This involves:  Eating 5 or more servings of fruits and vegetables a day.  Making dietary changes that address high blood pressure (hypertension), high cholesterol, diabetes, or obesity.  Manage your cholesterol levels.  Making food choices that are high in fiber and low in saturated fat, trans fat, and cholesterol may control cholesterol levels.  Take any prescribed medicines to control cholesterol as directed by your health care provider.  Manage your diabetes.  Controlling your carbohydrate and sugar intake is recommended to manage diabetes.  Take any prescribed medicines to control diabetes as directed by your health care provider.  Control your hypertension.  Making food choices that are low in salt (sodium), saturated fat, trans fat, and cholesterol is recommended to manage hypertension.  Ask your health care provider if you need treatment to lower your blood pressure. Take any prescribed medicines to control hypertension as directed by your health care provider.  If you are 18-39 years of age, have your blood pressure checked every 3-5 years. If you are 40 years of age or older, have your blood pressure checked every year.  Maintain a healthy weight.  Reducing calorie intake and making food choices that are low in sodium, saturated fat, trans fat, and cholesterol are recommended to manage  weight.  Stop drug abuse.  Avoid taking birth control pills.  Talk to your health care provider about the risks of taking birth control pills if you are over 35 years old, smoke, get migraines, or have ever had a blood clot.  Get evaluated for sleep disorders (sleep apnea).  Talk to your health care provider about getting a sleep evaluation if you snore a lot or have excessive sleepiness.  Take medicines only as directed by your health care provider.  For some people, aspirin or blood thinners (anticoagulants) are helpful in reducing the risk of forming abnormal blood clots that can lead to stroke. If you have the irregular heart rhythm of atrial fibrillation, you should be on a blood thinner unless there is a good reason you cannot take them.  Understand all your medicine instructions.  Make sure that other conditions (such as anemia or atherosclerosis) are addressed. Get help right away if:  You have sudden weakness or numbness of the face, arm, or leg, especially on one side of the body.  Your face or eyelid droops to one side.  You have sudden confusion.  You have trouble speaking (aphasia) or understanding.  You have sudden trouble seeing in one or both eyes.  You have sudden trouble walking.  You have dizziness.  You have a loss of balance or coordination.  You have a sudden, severe headache with no known cause.  You have new chest pain or an irregular heartbeat. Any of these symptoms may represent a serious problem that is an emergency. Do not wait to see if the symptoms will go away.   Get medical help at once. Call your local emergency services (911 in [place] Do not drive yourself to the hospital. This information is not intended to replace advice given to you by your health care provider. Make sure you discuss any questions you have with your health care provider. Document Released: 05/13/2004 Document Revised: 09/11/2015 Document Reviewed: 10/06/2012 Elsevier  Interactive Patient Education  2017 Elsevier Inc.  

## 2016-11-22 NOTE — Progress Notes (Signed)
Subjective:    Patient ID: Kelly Lloyd, male    DOB: Sep 10, 1927, 83 y.o.   MRN: [id number] 83 year old right-handed male with history of atrial flutter with ablation in 2013, dementia, maintained on Aricept and Namenda.  Lives with spouse.  Reported to be independent prior to admission.  At baseline, was able to feed himself and wash.  Presented on October 17, 2016, after being found down with left- sided weakness.  Presented to [facility] with CT of the brain showing no acute intracranial process.  TPA was administered.  Transferred to [facility] for further evaluation.  MRI reviewed showed multiple right scattered acute infarcts in the right MCA territory most confluent about the upper central sulcus where there was petechial hemorrhage.  CT angiogram of the head showed no large vessel occlusion.  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  No branch vessel occlusion.  Echocardiogram with ejection fraction of 70%.  Carotid Doppler of right ICA demonstrated moderate amount of smooth homogeneous plaque suggestive of 60-79% stenosis.  Neurology consulted.  Maintained initially on subcutaneous Lovenox for DVT prophylaxis.  Later with initiation of Eliquis with history of atrial flutter and CVA prophylaxis.  Tolerating a regular diet.  Vascular Surgery was consulted in regard to right carotid stenosis.  Did not feel the patient was a candidate for carotid surgery due to advanced dementia.  DATE OF ADMISSION:  10/20/2016 DATE OF DISCHARGE:  11/08/2016 HPI  Lives in assisted living.  Pt is receiving physical therapy ~2x per wk.  No falls.  Has seen MD at Assisted living facility  Severely demented and with aphasia Amb without AD, needs occ asist with ADLs  Eating well.  Wife eats lunch with him every day.  Gained back 10lb.    No reported sleeping problems at [facility] facility  Wife states that the patient has an appointment with  neurology. She does not see any change in cognitive function since the stroke. He does still have problems getting up from a chair.  Pain Inventory Average Pain 0 Pain Right Now 0 My pain is no pain  In the last 24 hours, has pain interfered with the following? General activity 0 Relation with others 0 Enjoyment of life 0 What TIME of day is your pain at its worst? no pain Sleep (in general) Good  Pain is worse with: no pain Pain improves with: no pain Relief from Meds: no pain  Mobility walk without assistance ability to climb steps?  yes do you drive?  no  Function retired I need assistance with the following:  dressing, bathing and toileting  Neuro/Psych bladder control problems bowel control problems depression anxiety  Prior Studies hospital f/u  Physicians involved in your care hospital f/u   Family History  Problem Relation Age of Onset  . Thyroid disease Unknown        family Hx  . Stroke Neg Hx        neg hx of CV  . Diabetes Neg Hx        DM; also neg hx of HBP  . Cancer Neg Hx        breast, ovarian, uterine  . Depression Neg Hx   . Alcohol abuse Neg Hx        no drug abuse    Social History   Social History  . Marital status: Married    Spouse name: N/A  . Number of children: 3  .  Years of education: N/A   Occupational History  . [employer] in [place] History Main Topics  . Smoking status: Former Smoker    Packs/day: 1.00    Years: 24.00    Quit date: 01/12/1974  . Smokeless tobacco: Never Used     Comment: quit in 1975  . Alcohol use No     Comment: 10/21/2016 "used to have 1 beer/month; probably hasn't had one in the last year"  . Drug use: No  . Sexual activity: Yes   Other Topics Concern  . None   Social History Narrative   Married. 3 children. [employer]   retired   Past Surgical History:  Procedure Laterality Date  . [place]  . ATRIAL FLUTTER  ABLATION  02/07/2009   aflutter ablation. TEE EF 55% 02/06/09  . audiol eval  06/07/02  . BACK SURGERY  2008   "disc surgery; not sure what part of back"  . COLONOSCOPY  03/08/06   2 polyps, divertics, int ext hemorrhoids  . HEMORRHOID SURGERY  03/08/06   [place]  . [place]   [facility]  . HERNIA REPAIR  04/07/10   Dr. Bary Castilla  . [place]  . MRI INT  2/204   auit canals wnl except Mastoiditis  . TRANSURETHRAL RESECTION OF PROSTATE  2011   Past Medical History:  Diagnosis Date  . Alzheimer's dementia   . Atrial flutter ([place])    aflutter s/p RFCA  . Benign prostatic hypertrophy   . BPH (benign prostatic hyperplasia)   . Diverticulosis of colon   . Stroke ([place]) 10/17/2016   "can't use his left arm; LLE seems weaker" (10/21/2016)   BP (!) 96/45 (BP Location: Left Arm, Patient Position: Sitting, Cuff Size: Normal)   Pulse (!) 51   Resp 14   SpO2 97%   Opioid Risk Score:   Fall Risk Score:  `1  Depression screen PHQ 2/9  No flowsheet data found.   Review of Systems  Constitutional: Negative.   HENT: Negative.   Eyes: Negative.   Respiratory: Negative.   Cardiovascular: Negative.   Gastrointestinal:       Mild bladder control problems  Endocrine: Negative.   Genitourinary: Positive for difficulty urinating.  Musculoskeletal: Negative.   Skin: Negative.   Allergic/Immunologic: Negative.   Neurological: Negative.   Hematological: Negative.   Psychiatric/Behavioral: Positive for dysphoric mood. The patient is nervous/anxious.        Objective:   Physical Exam  Constitutional: He appears well-developed and well-nourished.  HENT:  Head: Normocephalic and atraumatic.  Eyes: Pupils are equal, round, and reactive to light. Conjunctivae and EOM are normal.  Neck: Normal range of motion.  Cardiovascular: Regular rhythm.  Bradycardia present.   Murmur heard. Pulmonary/Chest: Effort  normal and breath sounds normal. No respiratory distress. He has no wheezes.  Abdominal: Soft. Bowel sounds are normal. He exhibits no distension. There is no tenderness.  Neurological: He is alert.  Motor strength is 5/5 bilateral deltoid, biceps, triceps, grip, hip flexion, knee extension, ankle dorsiflexion. Orientation is to person only  Gait is without evidence of toe drag or knee instability, ambulates without assistive device Sensation cannot assess secondary to cognitive function. However, he does sense warm and cold.  Psychiatric: He has a normal mood and affect.  Nursing note and vitals reviewed.         Assessment & Plan:  1.  Right MCA infarct, no residual weakness noted. According to wife, his severe cognitive deficits are at baseline. His main functional change is difficulty with arising from a chair, which is most likely from hip extensor weakness related to inactivity  I do think he would benefit from additional physical therapy at assisted living facility. Do not think any speech or OT as needed.  He will follow up with PCP at assisted living facility. Also will follow-up with vascular neurology September 19  Physical medicine and rehabilitation follow-up on as-needed basis  Discussed with wife. She agrees with plan. He is very happy with the service that she received on the inpatient rehabilitation unit.

## 2017-01-05 ENCOUNTER — Encounter: Payer: Self-pay | Admitting: Neurology

## 2017-01-05 ENCOUNTER — Ambulatory Visit (INDEPENDENT_AMBULATORY_CARE_PROVIDER_SITE_OTHER): Payer: Medicare Other | Admitting: Neurology

## 2017-01-05 VITALS — BP 89/53 | HR 57 | Ht 71.0 in | Wt 141.2 lb

## 2017-01-05 DIAGNOSIS — I4892 Unspecified atrial flutter: Secondary | ICD-10-CM

## 2017-01-05 DIAGNOSIS — I63411 Cerebral infarction due to embolism of right middle cerebral artery: Secondary | ICD-10-CM | POA: Insufficient documentation

## 2017-01-05 DIAGNOSIS — I480 Paroxysmal atrial fibrillation: Secondary | ICD-10-CM | POA: Diagnosis not present

## 2017-01-05 DIAGNOSIS — I679 Cerebrovascular disease, unspecified: Secondary | ICD-10-CM | POA: Diagnosis not present

## 2017-01-05 DIAGNOSIS — I6521 Occlusion and stenosis of right carotid artery: Secondary | ICD-10-CM | POA: Diagnosis not present

## 2017-01-05 DIAGNOSIS — F0390 Unspecified dementia without behavioral disturbance: Secondary | ICD-10-CM

## 2017-01-05 DIAGNOSIS — F03C0 Unspecified dementia, severe, without behavioral disturbance, psychotic disturbance, mood disturbance, and anxiety: Secondary | ICD-10-CM

## 2017-01-05 NOTE — Patient Instructions (Addendum)
-   continue eliquis for stroke prevention - continue aricept and namenda for cognitive impairment - Follow up with your primary care physician for stroke risk factor modification. Recommend maintain blood pressure goal <130/80, diabetes with hemoglobin A1c goal below 7.0% and lipids with LDL cholesterol goal below 70 mg/dL.  - continue PT at memory unit.  - check BP at memory unit daily and record for at least 2-3 weeks and see the trend.  - follow up in 4 months.

## 2017-01-05 NOTE — Progress Notes (Signed)
STROKE NEUROLOGY FOLLOW UP NOTE  NAME: Kelly Lloyd DOB: [birth year]  REASON FOR VISIT: stroke follow up HISTORY FROM: wife and chart  Today we had the pleasure of seeing Kelly Lloyd in follow-up at our Neurology Clinic. Pt was accompanied by wife and son.   History Summary Kelly Lloyd a 83 y.o.malewith history of atrial flutter (s/p ablation in 2013) and advanced dementia living in memory unit admitted to [facility] on 10/17/2016 for left arm weakness after a fall at home. Hereceived tPA at 1030 on 10/17/2016. CT no acute abnormality, only old right parietal cortical and subcortical infarcts. MRI showed right MCA scattered infarcts. CUS right ICA 60-79% stenosis. CTA head and neck showed right ICA proximal city 5-70% stenosis, 80% right V1 stenosis, bilateral siphon atherosclerosis, 50% BA stenosis and left VA ends up at PICA. EF 65-70%. LDL 66, A1c 5.7. Patient had a 40 pound weight loss for 6 months PTA. However, family declined LE venous Doppler or pan CT. Vessel surgery consulted, considered not a good candidate for right CEA, recommend medical treatment. Stroke etiology likely due to right ICA stenosis versus A. fib not on anticoagulation. However, hypercoagulable state due to potential malignancy or paradoxical emboli cannot be completely ruled out. He was started on eliquis at day 7 with aspirin bridge. Discharged to CIR.  Interval History During the interval time, the patient has been doing well. Discharge from CIR after 3 weeks. No back to memory unit, left-sided weakness resolved. However, according to family, his cognitive impairment seems worse than before the stroke. He continues on Aricept and Namenda, had the behavior disturbance, and now on risperdal and Ativan. BP low today, 89/53. Not sure about his baseline BP, however, asymptomatic.  REVIEW OF SYSTEMS: Full 14 system review of systems performed and notable only for those listed below and in HPI above, all others are  negative:  Constitutional:   Cardiovascular:  Ear/Nose/Throat:   Skin:  Eyes:   Respiratory:   Gastroitestinal:   Genitourinary:  Hematology/Lymphatic:   Endocrine:  Musculoskeletal:   Allergy/Immunology:   Neurological:  Memory loss, weakness, speech difficulty Psychiatric: Agitation, confusion, anxious Sleep:   The following represents the patient's updated allergies and side effects list: No Known Allergies  The neurologically relevant items on the patient's problem list were reviewed on today's visit.  Neurologic Examination  A problem focused neurological exam (12 or more points of the single system neurologic examination, vital signs counts as 1 point, cranial nerves count for 8 points) was performed.  Blood pressure (!) 89/53, pulse (!) 57, height 5\' 11"  (1.803 m), weight 141 lb 3.2 oz (64 kg).  General - Well nourished, well developed, in no apparent distress.  Ophthalmologic - Fundi not visualized due to noncooperation.  Cardiovascular - Regular rate and rhythm with no murmur.  Mental Status -  Awake, alert, not orientated to time, age or place. Able to tell his first name and his wife's first name, cannot tell last name. Language exam showed partial global aphasia, anomia, not able to repeat.  Cranial Nerves II - XII - II - blinking to visual threat bilaterally. III, IV, VI - Extraocular movements intact. V - Facial sensation intact bilaterally. VII - Facial movement intact bilaterally. VIII - Hearing & vestibular intact bilaterally. X - Palate elevates symmetrically. XI - Chin turning & shoulder shrug intact bilaterally. XII - Tongue protrusion intact.  Motor Strength - The patient's strength was normal in all extremities and pronator drift was absent.  Bulk  was normal and fasciculations were absent.   Motor Tone - Muscle tone was assessed at the neck and appendages and was normal.  Reflexes - The patient's reflexes were 1+ in all extremities and he had  no pathological reflexes.  Sensory - Light touch, temperature/pinprick were assessed and were normal.    Coordination - not cooperative on exam.  Tremor was absent.  Gait and Station - slow, small stride, steady with stooped posturing.   Functional score  mRS = 3   0 - No symptoms.   1 - No significant disability. Able to carry out all usual activities, despite some symptoms.   2 - Slight disability. Able to look after own affairs without assistance, but unable to carry out all previous activities.   3 - Moderate disability. Requires some help, but able to walk unassisted.   4 - Moderately severe disability. Unable to attend to own bodily needs without assistance, and unable to walk unassisted.   5 - Severe disability. Requires constant nursing care and attention, bedridden, incontinent.   6 - Dead.   NIH Stroke Scale   Level Of Consciousness 0=Alert; keenly responsive 1=Not alert, but arousable by minor stimulation 2=Not alert, requires repeated stimulation 3=Responds only with reflex movements 0  LOC Questions to Month and Age 30=Answers both questions correctly 1=Answers one question correctly 2=Answers neither question correctly 2  LOC Commands      -Open/Close eyes     -Open/close grip 0=Performs both tasks correctly 1=Performs one task correctly 2=Performs neighter task correctly 2  Best Gaze 0=Normal 1=Partial gaze palsy 2=Forced deviation, or total gaze paresis 0  Visual 0=No visual loss 1=Partial hemianopia 2=Complete hemianopia 3=Bilateral hemianopia (blind including cortical blindness) 0  Facial Palsy 0=Normal symmetrical movement 1=Minor paralysis (asymmetry) 2=Partial paralysis (lower face) 3=Complete paralysis (upper and lower face) 0  Motor  0=No drift, limb holds posture for full 10 seconds 1=Drift, limb holds posture, no drift to bed 2=Some antigravity effort, cannot maintain posture, drifts to bed 3=No effort against gravity, limb falls 4=No  movement Right Arm 0     Leg 0    Left Arm 0     Leg 0  Limb Ataxia 0=Absent 1=Present in one limb 2=Present in two limbs 0  Sensory 0=Normal 1=Mild to moderate sensory loss 2=Severe to total sensory loss 0  Best Language 0=No aphasia, normal 1=Mild to moderate aphasia 2=Mute, global aphasia 3=Mute, global aphasia 1  Dysarthria 0=Normal 1=Mild to moderate 2=Severe, unintelligible or mute/anarthric 1  Extinction/Neglect 0=No abnormality 1=Extinction to bilateral simultaneous stimulation 2=Profound neglect 0  Total   6     Data reviewed: I personally reviewed the images and agree with the radiology interpretations.  Ct Angio Head W Or Wo Contrast 10/17/2016 IMPRESSION: No large vessel occlusion. Peripheral calcification throughout both carotid siphons with stenosis estimated at 30-50% bilaterally. Atherosclerotic calcification of the right vertebral artery at the foramen magnum with stenosis estimated at 50-70% in that segment. 50% stenosis of the distal basilar artery. No branch vessel occlusion.   Ct Head Code Stroke W/o Cm 10/17/2016 IMPRESSION: 1. No acute finding by CT. Atrophy and chronic small vessel ischemic changes. Old right parietal cortical and subcortical infarction. Extensive sinus inflammatory disease on the right. Possible mucoceles. 2. ASPECTS is 10.   Mr Brain Wo Contrast 10/18/2016 IMPRESSION: 1. Scattered acute infarctions in the right MCA territory, most confluent about the upper central sulcus where there is petechial hemorrhage. 2. Background of moderate to advanced chronic small vessel ischemia.  Cerebral atrophy that has progressed from 2011. 3. Chronic sinusitis from right middle meatus obstruction  CUS 10/19/2016 1. Findings are suggestive of 60-79% stenosis in the right internal carotid based on 2D/gray scale imaging and 1-39% stenosis in the left internal carotid artery.  Both vertebral arteries show antegrade flow pattern.  TTE 10/18/2016 Left  ventricle: The cavity size was normal. Wall thickness was normal. Systolic function was vigorous. The estimated ejection fraction was in the range of 65% to 70%. Impressions:Poor acoustic windows. Study mainly done from subcostal view  CT Angio Neck 10/19/2016 1. 65-70% atheromatous narrowing of the proximal right ICA. 2. ~80% narrowing of the dominant right V1 segment. 3. No visible ulcerated plaque.  Component     Latest Ref Rng & Units 10/18/2016  Cholesterol     0 - 200 mg/dL 136  Triglycerides     <150 mg/dL 63  HDL Cholesterol     >40 mg/dL 57  Total CHOL/HDL Ratio     RATIO 2.4  VLDL     0 - 40 mg/dL 13  LDL (calc)     0 - 99 mg/dL 66  Hemoglobin A1C     4.8 - 5.6 % 5.7 (H)  Mean Plasma Glucose     mg/dL 117    Assessment: As you may recall, he is a 83 y.o. Caucasian male with PMH of atrial flutter (s/p ablation in 2013) and advanced dementia living in memory unit admitted [facility] 10/17/2016 for left arm weakness after a fall at home. Hereceived tPA at 1030 on 10/17/2016. CT no acute abnormality, only old right parietal cortical and subcortical infarcts. MRI showed right MCA scattered infarcts. CUS right ICA 60-79% stenosis. CTA head and neck showed right ICA proximal 65-70% stenosis, 80% right V1 stenosis, bilateral siphon atherosclerosis, 50% BA stenosis. EF 65-70%. LDL 66, A1c 5.7. Patient had a 40 pound weight loss for 6 months PTA. However, family declined LE venous Doppler or pan CT. Vessel surgery consulted, not a good candidate for right CEA, recommend medical treatment. Stroke etiology likely due to right ICA stenosis versus A. fib not on anticoagulation. He was started on eliquis at day 7 with aspirin bridge. Stayed in CIR for 3 weeks then back to memory unit, left-sided weakness resolved. However, according to family, his cognitive impairment seems worse than before the stroke. He continues on Aricept and Namenda, had the behavior disturbance, and now on risperdal and  Ativan. BP low today, 89/53, asymptomatic. Pt weight stable so far.  Plan:  - continue eliquis for stroke prevention - continue aricept and namenda for cognitive impairment - Follow up with your primary care physician for stroke risk factor modification. Recommend maintain blood pressure goal <130/80, diabetes with hemoglobin A1c goal below 7.0% and lipids with LDL cholesterol goal below 70 mg/dL.  - continue PT at memory unit.  - check BP at memory unit daily and record for at least 2-3 weeks and see the trend.  - follow up in 4 months with carolyn.   I spent more than 25 minutes of face to face time with the patient. Greater than 50% of time was spent in counseling and coordination of care.    No orders of the defined types were placed in this encounter.   Meds ordered this encounter  Medications  . LORazepam (ATIVAN) 0.5 MG tablet    Refill:  0    Patient Instructions  - continue eliquis for stroke prevention - continue aricept and namenda for cognitive  impairment - Follow up with your primary care physician for stroke risk factor modification. Recommend maintain blood pressure goal <130/80, diabetes with hemoglobin A1c goal below 7.0% and lipids with LDL cholesterol goal below 70 mg/dL.  - continue PT at memory unit.  - check BP at memory unit daily and record for at least 2-3 weeks and see the trend.  - follow up in 4 months.    [name], MD PhD [facility] [address] [place], [place] [address] [phone]

## 2017-01-22 ENCOUNTER — Emergency Department
Admission: EM | Admit: 2017-01-22 | Discharge: 2017-01-22 | Disposition: A | Discharge status: Home / Self Care | Payer: Medicare Other | Attending: Emergency Medicine | Admitting: Emergency Medicine

## 2017-01-22 ENCOUNTER — Encounter: Payer: Self-pay | Admitting: Emergency Medicine

## 2017-01-22 ENCOUNTER — Emergency Department: Payer: Medicare Other

## 2017-01-22 DIAGNOSIS — S51002A Unspecified open wound of left elbow, initial encounter: Secondary | ICD-10-CM | POA: Insufficient documentation

## 2017-01-22 DIAGNOSIS — W231XXA Caught, crushed, jammed, or pinched between stationary objects, initial encounter: Secondary | ICD-10-CM | POA: Insufficient documentation

## 2017-01-22 DIAGNOSIS — Y999 Unspecified external cause status: Secondary | ICD-10-CM | POA: Insufficient documentation

## 2017-01-22 DIAGNOSIS — G308 Other Alzheimer's disease: Secondary | ICD-10-CM | POA: Diagnosis not present

## 2017-01-22 DIAGNOSIS — Z79899 Other long term (current) drug therapy: Secondary | ICD-10-CM | POA: Diagnosis not present

## 2017-01-22 DIAGNOSIS — Z7901 Long term (current) use of anticoagulants: Secondary | ICD-10-CM | POA: Insufficient documentation

## 2017-01-22 DIAGNOSIS — S61401A Unspecified open wound of right hand, initial encounter: Secondary | ICD-10-CM | POA: Diagnosis not present

## 2017-01-22 DIAGNOSIS — Y939 Activity, unspecified: Secondary | ICD-10-CM | POA: Insufficient documentation

## 2017-01-22 DIAGNOSIS — Y92122 Bedroom in nursing home as the place of occurrence of the external cause: Secondary | ICD-10-CM | POA: Diagnosis not present

## 2017-01-22 DIAGNOSIS — Z87891 Personal history of nicotine dependence: Secondary | ICD-10-CM | POA: Insufficient documentation

## 2017-01-22 DIAGNOSIS — S6991XA Unspecified injury of right wrist, hand and finger(s), initial encounter: Secondary | ICD-10-CM | POA: Diagnosis present

## 2017-01-22 NOTE — Discharge Instructions (Signed)
Fortunately, Kelly Lloyd's hand x-rays were normal.  Please keep his wounds clean and dry.  Please do not use antibiotics ointment on his hand wound because it will make the skin tape fall off.  The tape and glue should fall off on its own in 1-2 weeks.  Please follow up with his regular doctor in about 5 days for a reassessment (or earlier if needed).   Return to the emergency department if you develop new or worsening symptoms that concern you.

## 2017-01-22 NOTE — ED Notes (Signed)
Sitter at bedside.

## 2017-01-22 NOTE — ED Provider Notes (Signed)
[facility] Emergency Department Provider Note  ____________________________________________   First MD Initiated Contact with Patient 01/22/17 0321     (approximate)  I have reviewed the triage vital signs and the nursing notes.   HISTORY  Chief Complaint Laceration  Level 5 caveat:  history/ROS limited by chronic dementia  HPI Kelly Lloyd is a 83 y.o. male with advanced Alzheimer's dementia who presents for evaluation of a laceration to his right hand and a smaller laceration to his left elbow.  Reportedly he somehow got his hand caught in the bed at his skilled nursing facility.  He did not have a fall and has not lost consciousness.  He is in no distress and not reporting any pain.  His family is at the bedside (son and wife) and said that he is at his baseline.  He has not been ill recently and is followed closely by his primary care doctor.   Past Medical History:  Diagnosis Date  . Alzheimer's dementia   . Atrial flutter ([place])    aflutter s/p RFCA  . Benign prostatic hypertrophy   . BPH (benign prostatic hyperplasia)   . Diverticulosis of colon   . Stroke ([place]) 10/17/2016   "can't use his left arm; LLE seems weaker" (10/21/2016)    Patient Active Problem List   Diagnosis Date Noted  . Cerebrovascular accident (CVA) due to embolism of right middle cerebral artery ([place]) 01/05/2017  . Advanced dementia 01/05/2017  . Stenosis of right carotid artery 01/05/2017  . Intracranial vascular stenosis 01/05/2017  . Right middle cerebral artery stroke ([place]) 10/21/2016  . Atrial flutter ([place])   . Flaccid hemiplegia of left nondominant side as late effect of cerebral infarction ([place])   . Weight loss 10/18/2016  . Paroxysmal atrial fibrillation ([place]) 10/18/2016  . Status post catheter ablation of atrial fibrillation 10/18/2016  . Alzheimer's disease 10/18/2016  . Acute ischemic stroke (HCC) - R MCA scattered infarcts, unknown etiology 10/17/2016  .  Anal stricture 04/17/2014  . Rectal pain 04/17/2014  . Unspecified constipation 08/30/2012  . Medicare annual wellness visit, initial 01/19/2012  . Medically noncompliant 01/19/2012  . Post-nasal drip 01/19/2012  . Hemorrhoid 06/24/2011  . PENILE LESION 02/12/2010  . 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 HYPERCHOLESTEROLEMIA 02/10/2010  . Memory loss 02/05/2009  . DIVERTICULOSIS, COLON 12/15/2006  . BENIGN PROSTATIC HYPERTROPHY 12/15/2006  . OSTEOARTHRITIS, GENERALIZED, MULTIPLE JOINTS 12/15/2006    Past Surgical History:  Procedure Laterality Date  . [place]  . ATRIAL FLUTTER ABLATION  02/07/2009   aflutter ablation. TEE EF 55% 02/06/09  . audiol eval  06/07/02  . BACK SURGERY  2008   "disc surgery; not sure what part of back"  . COLONOSCOPY  03/08/06   2 polyps, divertics, int ext hemorrhoids  . HEMORRHOID SURGERY  03/08/06   [place]  . [place]   at [facility] 09/01/2010  . HERNIA REPAIR  04/07/10   Dr. Bary Castilla  . [place]  . MRI INT  2/204   auit canals wnl except Mastoiditis  . TRANSURETHRAL RESECTION OF PROSTATE  2011    Prior to Admission medications   Medication Sig Start Date End Date Taking? Authorizing Provider  apixaban (ELIQUIS) 5 MG TABS tablet Take 1 tablet (5 mg total) by mouth 2 (two) times daily. 11/08/16  Yes Love, Ivan Anchors, PA-C  Cholecalciferol (VITAMIN D3) 50000 units TABS Take 1 tablet by mouth once  a week.   Yes [provider]  donepezil (ARICEPT) 10 MG tablet Take 1 tablet (10 mg total) by mouth daily. 11/08/16  Yes Love, Ivan Anchors, PA-C  ENSURE PLUS (ENSURE PLUS) LIQD Take 237 mLs by mouth 3 (three) times daily between meals.   Yes [provider]  finasteride (PROSCAR) 5 MG tablet Take 5 mg by mouth daily.   Yes [provider]  LORazepam (ATIVAN) 0.5 MG tablet Take 0.5 mg by mouth 2 (two) times daily as needed for anxiety.    Yes [provider]  memantine (NAMENDA) 10 MG tablet Take 1 tablet (10 mg total) by mouth 2 (two) times daily. 11/08/16  Yes Love, Ivan Anchors, PA-C  risperiDONE (RISPERDAL) 0.5 MG tablet Take 0.5 mg by mouth daily.   Yes [provider]  LORazepam (ATIVAN) 0.5 MG tablet 0.5 mg at bedtime.  12/10/16   [provider]    Allergies Patient has no known allergies.  Family History  Problem Relation Age of Onset  . Thyroid disease Unknown        family Hx  . Stroke Neg Hx        neg hx of CV  . Diabetes Neg Hx        DM; also neg hx of HBP  . Cancer Neg Hx        breast, ovarian, uterine  . Depression Neg Hx   . Alcohol abuse Neg Hx        no drug abuse     Social History Social History  Substance Use Topics  . Smoking status: Former Smoker    Packs/day: 1.00    Years: 24.00    Quit date: 01/12/1974  . Smokeless tobacco: Never Used     Comment: quit in 1975  . Alcohol use No     Comment: 10/21/2016 "used to have 1 beer/month; probably hasn't had one in the last year"    Review of Systems Level 5 caveat:  history/ROS limited by chronic dementia  ____________________________________________   PHYSICAL EXAM:  VITAL SIGNS: ED Triage Vitals  Enc Vitals Group     BP 01/22/17 0149 124/60     Pulse Rate 01/22/17 0149 73     Resp 01/22/17 0149 14     Temp 01/22/17 0149 97.9 F (36.6 C)     Temp Source 01/22/17 0149 Oral     SpO2 01/22/17 0149 97 %     Weight 01/22/17 0150 66.2 kg (146 lb)     Height 01/22/17 0150 1.803 m (5\' 11" )     Head Circumference --      Peak Flow --      Pain Score --      Pain Loc --      Pain Edu? --      Excl. in [place]? --     Constitutional: Awakens to voice and touch, smiles, does not indicate any pain but is not really able to communicate, fully disoriented Eyes: Conjunctivae are normal.  Head: Atraumatic. Cardiovascular: Normal rate, regular rhythm. Good peripheral circulation. Grossly normal heart  sounds. Respiratory: Normal respiratory effort.  No retractions. Lungs CTAB. Gastrointestinal: Soft and nontender. No distention.  Musculoskeletal: No lower extremity tenderness nor edema. No gross deformities of extremities. Neurologic:  Normal speech and language. No gross focal neurologic deficits are appreciated.  Skin:  Skin is warm, dry and intact except for the following:  He has a small superficial skin tear on his left elbow with no  accompanying contusion or ecchymosis or swelling.  On the dorsum of his right hand he has a large, approximately 4-5 cm curvilinear skin tear with an intact flap other than a small area at the most proximal section where the skin is missing.  The bleeding is well-controlled and the wound is superficial.  There does not seem to be any swelling, hematoma, nor bruising of the hand. Psychiatric: Mood and affect are normal. Speech and behavior are normal.  ____________________________________________   LABS (all labs ordered are listed, but only abnormal results are displayed)  Labs Reviewed - No data to display ____________________________________________  EKG  None - EKG not ordered by ED physician ____________________________________________  RADIOLOGY   Dg Hand Complete Right  Result Date: 01/22/2017 CLINICAL DATA:  Unwitnessed fall this morning, posterior hand skin tear, history dementia EXAM: RIGHT HAND - COMPLETE 3+ VIEW COMPARISON:  None FINDINGS: Diffuse osseous demineralization. Mild scattered narrowing of IP joints. Remaining joint spaces preserved. No acute fracture, dislocation, or bone destruction. IMPRESSION: No acute osseous abnormalities. Electronically Signed   By: Lavonia Dana M.D.   On: 01/22/2017 03:56    ____________________________________________   PROCEDURES  Critical Care performed: No   Procedure(s) performed:   Marland KitchenMarland KitchenLaceration Repair Date/Time: 01/22/2017 5:47 AM Performed by: Hinda Kehr Authorized by: Hinda Kehr    Consent:    Consent obtained:  Verbal   Consent given by:  Spouse Anesthesia (see MAR for exact dosages):    Anesthesia method:  None Repair type:    Repair type:  Simple Exploration:    Contaminated: no   Treatment:    Amount of cleaning:  Standard   Irrigation solution:  Sterile saline   Visualized foreign bodies/material removed: no   Skin repair:    Repair method:  Tissue adhesive and Steri-Strips Approximation:    Approximation:  Close Post-procedure details:    Dressing:  Sterile dressing   Patient tolerance of procedure:  Tolerated well, no immediate complications     ____________________________________________   INITIAL IMPRESSION / ASSESSMENT AND PLAN / ED COURSE    The patient had no fall or other traumatic injury except for catching his hand in the bed and his thin skin tore as a result.  I obtained radiographs of his right hand to make sure there was no crush injury or broken bones but the radiology report indicates that the bones and joints are normal.  The patient was in no distress at any point during his stay.  I repaired his hand as documented above with Steri-Strips and Dermabond I will do Steri-Strips in place.  I gave my usual and customary recommendations and return precautions to the family.  They understand and agree with the plan.  I also discussed with them the question of doing additional workup, but we all agreed that the patient would not come in tonight if it was not for hand injury and he has not been ill recently so they are comfortable not further working up the patient.     ____________________________________________  FINAL CLINICAL IMPRESSION(S) / ED DIAGNOSES  Final diagnoses:  Avulsion of skin of left elbow, initial encounter  Avulsion of skin of right hand, initial encounter     MEDICATIONS GIVEN DURING THIS VISIT:  Medications - No data to display   NEW OUTPATIENT MEDICATIONS STARTED DURING THIS VISIT:  New Prescriptions    No medications on file    Modified Medications   No medications on file    Discontinued Medications   DUTASTERIDE (AVODART)  0.5 MG CAPSULE    Take 1 capsule (0.5 mg total) by mouth daily.   RISPERIDONE (RISPERDAL) 0.5 MG TABLET    Take 1 tablet (0.5 mg total) by mouth daily.     Note:  This document was prepared using Dragon voice recognition software and may include unintentional dictation errors.    Hinda Kehr, MD 01/22/17 [phone]

## 2017-01-22 NOTE — ED Notes (Signed)
Report to [place] at [facility].

## 2017-01-22 NOTE — ED Triage Notes (Signed)
Pt from [facility] with skin tear to left lateral elbow and right hand. Ems states pt got hand stuck in side of bed leading to skin tear per staff at [facility]. Bleeding controlled.

## 2017-01-22 NOTE — ED Notes (Signed)
Ems here for transport

## 2017-01-22 NOTE — ED Notes (Signed)
Additional warm blankets provided, pt sleeping. Family updated on delay.

## 2017-01-22 NOTE — ED Notes (Signed)
Pt with approx 1.5 cm linear skin tear noted to left lateral elbow. Pt with skin tear noted to posterior right hand approx 2 cm. Dressing in place, bleeding controlled.

## 2017-03-01 ENCOUNTER — Encounter: Payer: Self-pay | Admitting: Neurology

## 2017-03-01 NOTE — Progress Notes (Signed)
Received BP monitoring sheet from SNF.  The BP between 01/06/17 to 01/29/17 ranging from 103-136/50-96. Much improved from last clinic visit. Continue current management.   [name], MD PhD Stroke Neurology 03/01/2017 8:02 AM

## 2017-05-09 ENCOUNTER — Ambulatory Visit: Payer: Medicare Other | Admitting: Nurse Practitioner

## 2017-05-19 ENCOUNTER — Emergency Department: Payer: Medicare Other

## 2017-05-19 ENCOUNTER — Encounter: Payer: Self-pay | Admitting: Emergency Medicine

## 2017-05-19 ENCOUNTER — Emergency Department
Admission: EM | Admit: 2017-05-19 | Discharge: 2017-05-19 | Disposition: A | Discharge status: Home / Self Care | Payer: Medicare Other | Attending: Emergency Medicine | Admitting: Emergency Medicine

## 2017-05-19 ENCOUNTER — Other Ambulatory Visit: Payer: Self-pay

## 2017-05-19 DIAGNOSIS — Y999 Unspecified external cause status: Secondary | ICD-10-CM | POA: Insufficient documentation

## 2017-05-19 DIAGNOSIS — G308 Other Alzheimer's disease: Secondary | ICD-10-CM | POA: Diagnosis not present

## 2017-05-19 DIAGNOSIS — Y939 Activity, unspecified: Secondary | ICD-10-CM | POA: Diagnosis not present

## 2017-05-19 DIAGNOSIS — Z87891 Personal history of nicotine dependence: Secondary | ICD-10-CM | POA: Insufficient documentation

## 2017-05-19 DIAGNOSIS — Z79899 Other long term (current) drug therapy: Secondary | ICD-10-CM | POA: Diagnosis not present

## 2017-05-19 DIAGNOSIS — R451 Restlessness and agitation: Secondary | ICD-10-CM | POA: Diagnosis not present

## 2017-05-19 DIAGNOSIS — F0281 Dementia in other diseases classified elsewhere with behavioral disturbance: Secondary | ICD-10-CM | POA: Diagnosis not present

## 2017-05-19 DIAGNOSIS — Z7901 Long term (current) use of anticoagulants: Secondary | ICD-10-CM | POA: Insufficient documentation

## 2017-05-19 DIAGNOSIS — Z8673 Personal history of transient ischemic attack (TIA), and cerebral infarction without residual deficits: Secondary | ICD-10-CM | POA: Insufficient documentation

## 2017-05-19 DIAGNOSIS — W19XXXA Unspecified fall, initial encounter: Secondary | ICD-10-CM | POA: Insufficient documentation

## 2017-05-19 DIAGNOSIS — Y92129 Unspecified place in nursing home as the place of occurrence of the external cause: Secondary | ICD-10-CM | POA: Diagnosis not present

## 2017-05-19 LAB — URINALYSIS, COMPLETE (UACMP) WITH MICROSCOPIC
BACTERIA UA: NONE SEEN
BILIRUBIN URINE: NEGATIVE
Glucose, UA: NEGATIVE mg/dL
Hgb urine dipstick: NEGATIVE
Ketones, ur: NEGATIVE mg/dL
LEUKOCYTES UA: NEGATIVE
Nitrite: NEGATIVE
PH: 6 (ref 5.0–8.0)
Protein, ur: NEGATIVE mg/dL
SQUAMOUS EPITHELIAL / LPF: NONE SEEN
Specific Gravity, Urine: 1.01 (ref 1.005–1.030)
WBC, UA: NONE SEEN WBC/hpf (ref 0–5)

## 2017-05-19 LAB — COMPREHENSIVE METABOLIC PANEL
ALBUMIN: 4 g/dL (ref 3.5–5.0)
ALT: 16 U/L — ABNORMAL LOW (ref 17–63)
AST: 36 U/L (ref 15–41)
Alkaline Phosphatase: 134 U/L — ABNORMAL HIGH (ref 38–126)
Anion gap: 8 (ref 5–15)
BILIRUBIN TOTAL: 0.5 mg/dL (ref 0.3–1.2)
BUN: 16 mg/dL (ref 6–20)
CALCIUM: 9.8 mg/dL (ref 8.9–10.3)
CO2: 29 mmol/L (ref 22–32)
Chloride: 103 mmol/L (ref 101–111)
Creatinine, Ser: 1.28 mg/dL — ABNORMAL HIGH (ref 0.61–1.24)
GFR calc Af Amer: 56 mL/min — ABNORMAL LOW (ref >60)
GFR calc non Af Amer: 49 mL/min — ABNORMAL LOW (ref >60)
GLUCOSE: 94 mg/dL (ref 65–99)
Potassium: 3.9 mmol/L (ref 3.5–5.1)
Sodium: 140 mmol/L (ref 135–145)
TOTAL PROTEIN: 7.1 g/dL (ref 6.5–8.1)

## 2017-05-19 LAB — CBC
HEMATOCRIT: 39.9 % — AB (ref 40.0–52.0)
HEMOGLOBIN: 13.2 g/dL (ref 13.0–18.0)
MCH: 31.8 pg (ref 26.0–34.0)
MCHC: 33 g/dL (ref 32.0–36.0)
MCV: 96.1 fL (ref 80.0–100.0)
Platelets: 274 10*3/uL (ref 150–440)
RBC: 4.15 MIL/uL — AB (ref 4.40–5.90)
RDW: 13.4 % (ref 11.5–14.5)
WBC: 11.5 10*3/uL — AB (ref 3.8–10.6)

## 2017-05-19 LAB — PROTIME-INR
INR: 1.11
Prothrombin Time: 14.2 seconds (ref 11.4–15.2)

## 2017-05-19 LAB — TROPONIN I: Troponin I: <0.03 ng/mL (ref <0.03)

## 2017-05-19 LAB — APTT: aPTT: 35 seconds (ref 24–36)

## 2017-05-19 MED ORDER — LORAZEPAM 2 MG/ML IJ SOLN
0.5000 mg | Freq: Once | INTRAMUSCULAR | Status: AC
  Administered 2017-05-19: 0.5 mg via INTRAVENOUS

## 2017-05-19 MED ORDER — HALOPERIDOL LACTATE 5 MG/ML IJ SOLN
INTRAMUSCULAR | Status: AC
  Administered 2017-05-19: 2 mg via INTRAVENOUS
  Filled 2017-05-19: qty 1

## 2017-05-19 MED ORDER — LORAZEPAM 2 MG/ML IJ SOLN
INTRAMUSCULAR | Status: AC
  Administered 2017-05-19: 0.5 mg via INTRAVENOUS
  Filled 2017-05-19: qty 1

## 2017-05-19 MED ORDER — HALOPERIDOL LACTATE 5 MG/ML IJ SOLN
2.0000 mg | Freq: Once | INTRAMUSCULAR | Status: AC
  Administered 2017-05-19: 2 mg via INTRAVENOUS

## 2017-05-19 NOTE — ED Notes (Signed)
In and out cath preformed by Jane, RN [place], RN and this RN. Patient agitated but able to be redirected.

## 2017-05-19 NOTE — ED Notes (Signed)
Son at bedside with patient. Patient in room with curtain open for easy visibility. Bed alarm in place.

## 2017-05-19 NOTE — ED Notes (Signed)
Patient sleeping in bed with even respirations noted. VSS. Family remains at bedside. Bed alarm in place.

## 2017-05-19 NOTE — ED Notes (Signed)
Pt cleaned up and pants changed. Warm blankets given

## 2017-05-19 NOTE — ED Notes (Signed)
This RN responded to bed alarm and found patient climbing out of bed. Son and daughter at bedside attempting to keep patient in bed. This RN and son able to get patient back in bed. MD made aware and at bedside. Orders given for medication for patient safety.

## 2017-05-19 NOTE — ED Provider Notes (Signed)
[facility] Emergency Department Provider Note   ____________________________________________    I have reviewed the triage vital signs and the nursing notes.   HISTORY  Chief Complaint Fall and Aggressive Behavior   History limited by Alzheimer's dementia  HPI Kelly Lloyd is a 84 y.o. male who presents from nursing facility with reported multiple falls overnight, no reports of injury.  Apparently the patient was also aggressive this morning which is not typical for him.  Interestingly the patient is on Eliquis I assume for atrial flutter.  Patient is calm and quiet in the emergency department not demonstrating any aggression.   Past Medical History:  Diagnosis Date  . Alzheimer's dementia   . Atrial flutter ([place])    aflutter s/p RFCA  . Benign prostatic hypertrophy   . BPH (benign prostatic hyperplasia)   . Diverticulosis of colon   . Stroke ([place]) 10/17/2016   "can't use his left arm; LLE seems weaker" (10/21/2016)    Patient Active Problem List   Diagnosis Date Noted  . Cerebrovascular accident (CVA) due to embolism of right middle cerebral artery ([place]) 01/05/2017  . Advanced dementia 01/05/2017  . Stenosis of right carotid artery 01/05/2017  . Intracranial vascular stenosis 01/05/2017  . 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 Atrial flutter ([place])   . Flaccid hemiplegia of left nondominant side as late effect of cerebral infarction ([place])   . Weight loss 10/18/2016  . Paroxysmal atrial fibrillation ([place]) 10/18/2016  . Status post catheter ablation of atrial fibrillation 10/18/2016  . Alzheimer's disease 10/18/2016  . Acute ischemic stroke (HCC) - R MCA scattered infarcts, unknown etiology 10/17/2016  . Anal stricture 04/17/2014  . Rectal pain 04/17/2014  . Unspecified constipation 08/30/2012  . Medicare annual wellness visit, initial 01/19/2012  . Medically noncompliant 01/19/2012  . Post-nasal drip 01/19/2012  .  Hemorrhoid 06/24/2011  . PENILE LESION 02/12/2010  . 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 HYPERCHOLESTEROLEMIA 02/10/2010  . Memory loss 02/05/2009  . DIVERTICULOSIS, COLON 12/15/2006  . BENIGN PROSTATIC HYPERTROPHY 12/15/2006  . OSTEOARTHRITIS, GENERALIZED, MULTIPLE JOINTS 12/15/2006    Past Surgical History:  Procedure Laterality Date  . [place]  . ATRIAL FLUTTER ABLATION  02/07/2009   aflutter ablation. TEE EF 55% 02/06/09  . audiol eval  06/07/02  . BACK SURGERY  2008   "disc surgery; not sure what part of back"  . COLONOSCOPY  03/08/06   2 polyps, divertics, int ext hemorrhoids  . HEMORRHOID SURGERY  03/08/06   [place]  . [place]   at [facility] 09/01/2010  . HERNIA REPAIR  04/07/10   Dr. Bary Castilla  . [place]  . MRI INT  2/204   auit canals wnl except Mastoiditis  . TRANSURETHRAL RESECTION OF PROSTATE  2011    Prior to Admission medications   Medication Sig Start Date End Date Taking? Authorizing Provider  apixaban (ELIQUIS) 5 MG TABS tablet Take 1 tablet (5 mg total) by mouth 2 (two) times daily. 11/08/16  Yes Love, Ivan Anchors, PA-C  donepezil (ARICEPT) 10 MG tablet Take 1 tablet (10 mg total) by mouth daily. 11/08/16  Yes Love, Ivan Anchors, PA-C  ENSURE PLUS (ENSURE PLUS) LIQD Take 237 mLs by mouth 3 (three) times daily between meals.   Yes [provider]  finasteride (PROSCAR) 5 MG tablet Take 5 mg by mouth daily.   Yes [provider]  LORazepam (  ATIVAN) 0.5 MG tablet 0.5 mg 2 (two) times daily as needed.  12/10/16  Yes [provider]  Melatonin 5 MG CAPS Take 5 mg by mouth at bedtime.   Yes [provider]  memantine (NAMENDA) 10 MG tablet Take 1 tablet (10 mg total) by mouth 2 (two) times daily. 11/08/16  Yes Love, Ivan Anchors, PA-C  mirtazapine (REMERON) 7.5 MG tablet Take 7.5 mg by mouth at bedtime.   Yes [provider]  traZODone  (DESYREL) 50 MG tablet Take 25-50 mg by mouth 2 (two) times daily. Take  tablet (25MG ) every morning and 1 tablet (50MG ) nightly at bedtime   Yes [provider]     Allergies Patient has no known allergies.  Family History  Problem Relation Age of Onset  . Thyroid disease Unknown        family Hx  . Stroke Neg Hx        neg hx of CV  . Diabetes Neg Hx        DM; also neg hx of HBP  . Cancer Neg Hx        breast, ovarian, uterine  . Depression Neg Hx   . Alcohol abuse Neg Hx        no drug abuse     Social History Social History   Tobacco Use  . Smoking status: Former Smoker    Packs/day: 1.00    Years: 24.00    Pack years: 24.00    Last attempt to quit: 01/12/1974    Years since quitting: 43.3  . Smokeless tobacco: Never Used  . Tobacco comment: quit in 1975  Substance Use Topics  . Alcohol use: No    Comment: 10/21/2016 "used to have 1 beer/month; probably hasn't had one in the last year"  . Drug use: No    5 caveat: Unable to obtain review of Systems due to Alzheimer's dementia     ____________________________________________   PHYSICAL EXAM:  VITAL SIGNS: ED Triage Vitals  Enc Vitals Group     BP 05/19/17 0756 (!) 130/52     Pulse Rate 05/19/17 0756 93     Resp --      Temp --      Temp src --      SpO2 05/19/17 0756 98 %     Weight 05/19/17 0801 68 kg (150 lb)     Height 05/19/17 0801 1.778 m (5\' 10" )     Head Circumference --      Peak Flow --      Pain Score --      Pain Loc --      Pain Edu? --      Excl. in [place]? --     Constitutional: Alert but refusing to answer questions Eyes: Conjunctivae are normal.  Head: Small abrasion right forehead Nose: No taxus or swelling Mouth/Throat: Mucous membranes are moist.   Neck:  Painless ROM Cardiovascular: Normal rate, regular rhythm.   Good peripheral circulation. Respiratory: Normal respiratory effort.  No retractions. Lungs CTAB. Gastrointestinal: Soft and nontender. No distention.  No  CVA tenderness.  Musculoskeletal: Skin tear right wrist, full range of motion of all extremities without pain, no clavicular tenderness.  No pain in the hips with axial load.  Warm and well perfused extremities Neurologic:  No gross focal neurologic deficits are appreciated.  Skin:  Skin is warm, dry and intact. No rash noted.   ____________________________________________   LABS (all labs ordered are listed, but  only abnormal results are displayed)  Labs Reviewed  CBC - Abnormal; Notable for the following components:      Result Value   WBC 11.5 (*)    RBC 4.15 (*)    HCT 39.9 (*)    All other components within normal limits  COMPREHENSIVE METABOLIC PANEL - Abnormal; Notable for the following components:   Creatinine, Ser 1.28 (*)    ALT 16 (*)    Alkaline Phosphatase 134 (*)    GFR calc non Af Amer 49 (*)    GFR calc Af Amer 56 (*)    All other components within normal limits  URINALYSIS, COMPLETE (UACMP) WITH MICROSCOPIC - Abnormal; Notable for the following components:   Color, Urine YELLOW (*)    APPearance CLOUDY (*)    All other components within normal limits  TROPONIN I  APTT  PROTIME-INR   ____________________________________________  EKG  None ____________________________________________  RADIOLOGY  Chest x-ray unremarkable Pelvis x-ray questionable suprapubic left rami fracture, nondisplaced, discussed this with daughter CT head unremarkable ____________________________________________   PROCEDURES  Procedure(s) performed: No  Procedures   Critical Care performed: No ____________________________________________   INITIAL IMPRESSION / ASSESSMENT AND PLAN / ED COURSE  Pertinent labs & imaging results that were available during my care of the patient were reviewed by me and considered in my medical decision making (see chart for details).  Patient presents with reports of multiple falls last night.  He does have Alzheimer's dementia.   Currently he has decreased responsiveness, it is unclear if he is just refusing to answer questions.  EMS reports that he was alert and talking on the right over.  No new medications reported.  Exam is overall reassuring, no evidence of significant injury.  Patient became quite agitated in the emergency department requiring 1 mg of Ativan and 2 of Haldol to keep him from falling out of the bed  Will observe in the emergency department  Patient remains calm and resting in bed, family agrees with discharge back to facility    ____________________________________________   FINAL CLINICAL IMPRESSION(S) / ED DIAGNOSES  Final diagnoses:  Agitation  Fall, initial encounter        Note:  This document was prepared using Dragon voice recognition software and may include unintentional dictation errors.    Lavonia Drafts, MD 05/19/17 [phone]

## 2017-05-19 NOTE — ED Notes (Signed)
Report given to [facility] from [facility] notifying that pt would be coming back to facility shortly once ambulance arrives

## 2017-05-19 NOTE — ED Triage Notes (Signed)
Patient from [facility] via [place]. Per EMS, staff reports patient had 3-4 falls last night with unknown injury. Staff also reports this morning patient was aggressive above his baseline, swinging arms and kicking at staff. Patient is on Eloquis. Upon arrival patient is lethargic but cooperative. Pupils are pinpoint. Patient with multiple skin tears to wrists and forearms. MD at bedside.

## 2017-05-20 NOTE — ED Notes (Signed)
AAOx3.  Return to SNF via [facility]

## 2017-07-16 ENCOUNTER — Emergency Department: Payer: Medicare Other

## 2017-07-16 ENCOUNTER — Emergency Department
Admission: EM | Admit: 2017-07-16 | Discharge: 2017-07-17 | Disposition: A | Discharge status: Home / Self Care | Payer: Medicare Other | Attending: Emergency Medicine | Admitting: Emergency Medicine

## 2017-07-16 DIAGNOSIS — Z79899 Other long term (current) drug therapy: Secondary | ICD-10-CM | POA: Insufficient documentation

## 2017-07-16 DIAGNOSIS — G309 Alzheimer's disease, unspecified: Secondary | ICD-10-CM | POA: Diagnosis not present

## 2017-07-16 DIAGNOSIS — Z87891 Personal history of nicotine dependence: Secondary | ICD-10-CM | POA: Diagnosis not present

## 2017-07-16 DIAGNOSIS — Z7901 Long term (current) use of anticoagulants: Secondary | ICD-10-CM | POA: Insufficient documentation

## 2017-07-16 DIAGNOSIS — F0390 Unspecified dementia without behavioral disturbance: Secondary | ICD-10-CM | POA: Diagnosis present

## 2017-07-16 DIAGNOSIS — N3001 Acute cystitis with hematuria: Secondary | ICD-10-CM | POA: Diagnosis not present

## 2017-07-16 DIAGNOSIS — F0280 Dementia in other diseases classified elsewhere without behavioral disturbance: Secondary | ICD-10-CM | POA: Insufficient documentation

## 2017-07-16 DIAGNOSIS — Z8673 Personal history of transient ischemic attack (TIA), and cerebral infarction without residual deficits: Secondary | ICD-10-CM | POA: Diagnosis not present

## 2017-07-16 DIAGNOSIS — W19XXXA Unspecified fall, initial encounter: Secondary | ICD-10-CM

## 2017-07-16 LAB — COMPREHENSIVE METABOLIC PANEL
ALT: 19 U/L (ref 17–63)
ANION GAP: 7 (ref 5–15)
AST: 27 U/L (ref 15–41)
Albumin: 3.8 g/dL (ref 3.5–5.0)
Alkaline Phosphatase: 168 U/L — ABNORMAL HIGH (ref 38–126)
BILIRUBIN TOTAL: 0.4 mg/dL (ref 0.3–1.2)
BUN: 18 mg/dL (ref 6–20)
CO2: 28 mmol/L (ref 22–32)
Calcium: 9.4 mg/dL (ref 8.9–10.3)
Chloride: 104 mmol/L (ref 101–111)
Creatinine, Ser: 1.32 mg/dL — ABNORMAL HIGH (ref 0.61–1.24)
GFR calc Af Amer: 54 mL/min — ABNORMAL LOW (ref >60)
GFR, EST NON AFRICAN AMERICAN: 47 mL/min — AB (ref >60)
Glucose, Bld: 103 mg/dL — ABNORMAL HIGH (ref 65–99)
POTASSIUM: 4.2 mmol/L (ref 3.5–5.1)
Sodium: 139 mmol/L (ref 135–145)
TOTAL PROTEIN: 7.8 g/dL (ref 6.5–8.1)

## 2017-07-16 LAB — URINALYSIS, COMPLETE (UACMP) WITH MICROSCOPIC
BILIRUBIN URINE: NEGATIVE
Bacteria, UA: NONE SEEN
GLUCOSE, UA: NEGATIVE mg/dL
Ketones, ur: NEGATIVE mg/dL
NITRITE: NEGATIVE
PH: 6 (ref 5.0–8.0)
Protein, ur: NEGATIVE mg/dL
Specific Gravity, Urine: 1.01 (ref 1.005–1.030)
Squamous Epithelial / LPF: NONE SEEN

## 2017-07-16 LAB — CBC WITH DIFFERENTIAL/PLATELET
BASOS ABS: 0.1 10*3/uL (ref 0–0.1)
Basophils Relative: 1 %
EOS PCT: 5 %
Eosinophils Absolute: 0.4 10*3/uL (ref 0–0.7)
HCT: 38.1 % — ABNORMAL LOW (ref 40.0–52.0)
Hemoglobin: 12.5 g/dL — ABNORMAL LOW (ref 13.0–18.0)
LYMPHS PCT: 23 %
Lymphs Abs: 2 10*3/uL (ref 1.0–3.6)
MCH: 31.6 pg (ref 26.0–34.0)
MCHC: 32.9 g/dL (ref 32.0–36.0)
MCV: 95.9 fL (ref 80.0–100.0)
Monocytes Absolute: 1.1 10*3/uL — ABNORMAL HIGH (ref 0.2–1.0)
Monocytes Relative: 13 %
NEUTROS ABS: 4.9 10*3/uL (ref 1.4–6.5)
Neutrophils Relative %: 58 %
PLATELETS: 340 10*3/uL (ref 150–440)
RBC: 3.97 MIL/uL — AB (ref 4.40–5.90)
RDW: 13 % (ref 11.5–14.5)
WBC: 8.5 10*3/uL (ref 3.8–10.6)

## 2017-07-16 LAB — TROPONIN I: Troponin I: <0.03 ng/mL (ref <0.03)

## 2017-07-16 MED ORDER — CEFTRIAXONE SODIUM 1 G IJ SOLR
INTRAMUSCULAR | Status: AC
  Filled 2017-07-16: qty 10

## 2017-07-16 MED ORDER — CEPHALEXIN 500 MG PO CAPS: 500.0000 mg | ORAL_CAPSULE | Freq: Three times a day (TID) | ORAL | 0 refills | Status: AC

## 2017-07-16 MED ORDER — CEPHALEXIN 500 MG PO CAPS
500.0000 mg | ORAL_CAPSULE | Freq: Once | ORAL | Status: AC
  Administered 2017-07-16: 500 mg via ORAL
  Filled 2017-07-16: qty 1

## 2017-07-16 MED ORDER — CEFTRIAXONE SODIUM 1 G IJ SOLR
1.0000 g | Freq: Once | INTRAMUSCULAR | Status: AC
  Administered 2017-07-16: 1 g via INTRAMUSCULAR

## 2017-07-16 NOTE — ED Notes (Signed)
Pt repeatedly attempting to get out of bed, this rn will remain at bedside to ensure safety.

## 2017-07-16 NOTE — ED Notes (Signed)
Patient is not tender to palpation of either shoulder for this RN

## 2017-07-16 NOTE — ED Notes (Signed)
Pt in ct scan  

## 2017-07-16 NOTE — ED Notes (Signed)
[place] at [place]

## 2017-07-16 NOTE — ED Notes (Signed)
Fall mats placed around bed, bed in low position, side rails up. Spouse at bedside.

## 2017-07-16 NOTE — ED Triage Notes (Signed)
Patient coming from [facility] memory care. Patient had unwitnessed fall. Damage to dry wall seen, facility reps believe damage was where patient hit his head. Patient tender to palpation of left shoulder for EMS.

## 2017-07-16 NOTE — ED Notes (Signed)
Spouse at bedside. Bed in low position, siderails up x2.

## 2017-07-16 NOTE — Discharge Instructions (Addendum)

## 2017-07-16 NOTE — ED Provider Notes (Signed)
The patient is unable to swallow his Keflex.  IV is already been removed so we will give 1 g of ceftriaxone IM prior to discharge.   Darel Hong, MD 07/16/17 [phone]

## 2017-07-16 NOTE — ED Notes (Signed)
Additional warm blankets provided

## 2017-07-16 NOTE — ED Notes (Signed)
Spouse is pt's legal guardian, md has spoken with spouse, spouse at bedside.

## 2017-07-16 NOTE — ED Notes (Signed)
Report from jenna, rn.  

## 2017-07-16 NOTE — ED Provider Notes (Signed)
[facility] Emergency Department Provider Note  ____________________________________________  Time seen: Approximately 8:47 PM  I have reviewed the triage vital signs and the nursing notes.   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  Staff from the nursing home noticed some damage to the dry wall in patient's room and patient was found on the ground.Patient is non verbal, will not open his eyes, not answer to any questions, does not follow any commands.  He will mumble.  The wife is at the bedside and reports that this is patient's baseline.  There is no obvious trauma on exam.  Past Medical History:  Diagnosis Date  . Alzheimer's dementia   . Atrial flutter ([place])    aflutter s/p RFCA  . Benign prostatic hypertrophy   . BPH (benign prostatic hyperplasia)   . Diverticulosis of colon   . Stroke ([place]) 10/17/2016   "can't use his left arm; LLE seems weaker" (10/21/2016)    Patient Active Problem List   Diagnosis Date Noted  . Cerebrovascular accident (CVA) due to embolism of right middle cerebral artery ([place]) 01/05/2017  . Advanced dementia 01/05/2017  . Stenosis of right carotid artery 01/05/2017  . Intracranial vascular stenosis 01/05/2017  . Right middle cerebral artery stroke ([place]) 10/21/2016  . Atrial flutter ([place])   . 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 Weight loss 10/18/2016  . Paroxysmal atrial fibrillation ([place]) 10/18/2016  . Status post catheter ablation of atrial fibrillation 10/18/2016  . Alzheimer's disease 10/18/2016  . Acute ischemic stroke (HCC) - R MCA scattered infarcts, unknown etiology 10/17/2016  . Anal stricture 04/17/2014    . Rectal pain 04/17/2014  . Unspecified constipation 08/30/2012  . Medicare annual wellness visit, initial 01/19/2012  . Medically noncompliant 01/19/2012  . Post-nasal drip 01/19/2012  . Hemorrhoid 06/24/2011  . PENILE LESION 02/12/2010  . 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 HYPERCHOLESTEROLEMIA 02/10/2010  . Memory loss 02/05/2009  . DIVERTICULOSIS, COLON 12/15/2006  . BENIGN PROSTATIC HYPERTROPHY 12/15/2006  . OSTEOARTHRITIS, GENERALIZED, MULTIPLE JOINTS 12/15/2006    Past Surgical History:  Procedure Laterality Date  . [place]  . ATRIAL FLUTTER ABLATION  02/07/2009   aflutter ablation. TEE EF 55% 02/06/09  . audiol eval  06/07/02  . BACK SURGERY  2008   "disc surgery; not sure what part of back"  . COLONOSCOPY  03/08/06   2 polyps, divertics, int ext hemorrhoids  . HEMORRHOID SURGERY  03/08/06   [place]  . [place]   at the [facility] 09/01/2010  . HERNIA REPAIR  04/07/10   Dr. Bary Castilla  . [place]  . MRI INT  2/204   auit canals wnl except Mastoiditis  . TRANSURETHRAL RESECTION OF PROSTATE  2011    Prior to Admission medications   Medication Sig Start Date End Date Taking? Authorizing Provider  apixaban (ELIQUIS) 5 MG TABS tablet Take 1 tablet (5 mg total) by mouth 2 (two) times daily. 11/08/16   Love, Ivan Anchors, PA-C  cephALEXin (KEFLEX) 500 MG capsule Take 1 capsule (500 mg total) by mouth 3 (three) times daily for 7  days. 07/16/17 07/23/17  Rudene Re, MD  donepezil (ARICEPT) 10 MG tablet Take 1 tablet (10 mg total) by mouth daily. 11/08/16   Love, Ivan Anchors, PA-C  ENSURE PLUS (ENSURE PLUS) LIQD Take 237 mLs by mouth 3 (three) times daily between meals.    [provider]  finasteride (PROSCAR) 5 MG tablet Take 5 mg by mouth daily.    [provider]  LORazepam (ATIVAN) 0.5 MG tablet 0.5 mg 2 (two) times daily as needed.  12/10/16   [provider]  Melatonin 5 MG CAPS Take 5 mg by mouth at bedtime.    [provider]  memantine (NAMENDA) 10 MG tablet Take 1 tablet (10 mg total) by mouth 2 (two) times daily. 11/08/16   Love, Ivan Anchors, PA-C  mirtazapine (REMERON) 7.5 MG tablet Take 7.5 mg by mouth at bedtime.    [provider]  traZODone (DESYREL) 50 MG tablet Take 25-50 mg by mouth 2 (two) times daily. Take  tablet (25MG ) every morning and 1 tablet (50MG ) nightly at bedtime    [provider]    Allergies Patient has no known allergies.  Family History  Problem Relation Age of Onset  . Thyroid disease Unknown        family Hx  . Stroke Neg Hx        neg hx of CV  . Diabetes Neg Hx        DM; also neg hx of HBP  . Cancer Neg Hx        breast, ovarian, uterine  . Depression Neg Hx   . Alcohol abuse Neg Hx        no drug abuse     Social History Social History   Tobacco Use  . Smoking status: Former Smoker    Packs/day: 1.00    Years: 24.00    Pack years: 24.00    Last attempt to quit: 01/12/1974    Years since quitting: 43.5  . Smokeless tobacco: Never Used  . Tobacco comment: quit in 1975  Substance Use Topics  . Alcohol use: No    Comment: 10/21/2016 "used to have 1 beer/month; probably hasn't had one in the last year"  . Drug use: No    Review of Systems Constitutional: Negative for fever. ENT: Negative for facial injury or neck injury Cardiovascular: Negative for chest injury. Respiratory: Negative for shortness of breath.  Gastrointestinal: Negative for abdominal injury. Musculoskeletal: Negative for back injury, negative for arm or leg pain. Skin: Negative for laceration/abrasions. Neurological: + head injury.  Level 5 caveat:  Portions of the history and physical were unable to be obtained due to dementia/ nonverbal  ____________________________________________   PHYSICAL EXAM:  VITAL SIGNS: ED Triage Vitals  Enc Vitals Group     BP 07/16/17 2037 (!) 107/59      Pulse Rate 07/16/17 2037 63     Resp 07/16/17 2037 17     Temp 07/16/17 2037 98.2 F (36.8 C)     Temp src --      SpO2 07/16/17 2037 98 %     Weight 07/16/17 2038 170 lb (77.1 kg)     Height --      Head Circumference --      Peak Flow --      Pain Score --      Pain Loc --      Pain Edu? --      Excl. in [place]? --    Constitutional:  Eyes closed, mumbling incoherent words, does not follow commands, nonverbal  HEENT Head: Normocephalic and atraumatic. Face: No facial bony tenderness. Stable midface Ears: No hemotympanum bilaterally. No Battle sign Eyes: No eye injury. PERRL. No raccoon eyes Nose: Nontender. No epistaxis. No rhinorrhea Mouth/Throat: Mucous membranes are moist. No oropharyngeal blood. No dental injury. Airway patent without stridor. Normal voice. Neck: no C-collar in place. No midline c-spine tenderness.  Cardiovascular: Normal rate, regular rhythm. Normal and symmetric distal pulses are present in all extremities. Pulmonary/Chest: Chest wall is stable and nontender to palpation/compression. Normal respiratory effort. Breath sounds are normal. No crepitus.  Abdominal: Soft, nontender, non distended. Musculoskeletal: Nontender with normal full range of motion in all extremities. No deformities. No thoracic or lumbar midline spinal tenderness. Pelvis is stable. Skin: Skin is warm, dry and intact. No abrasions or contutions. Neurological: Face is symmetric, moves all 4 extremities spontaneously  ____________________________________________   LABS (all labs ordered are listed, but only abnormal results are displayed)  Labs Reviewed  COMPREHENSIVE METABOLIC PANEL - Abnormal; Notable for the following components:      Result Value   Glucose, Bld 103 (*)    Creatinine, Ser 1.32 (*)    Alkaline Phosphatase 168 (*)    GFR calc non Af Amer 47 (*)    GFR calc Af Amer 54 (*)    All other components within normal limits  CBC WITH DIFFERENTIAL/PLATELET - Abnormal; Notable  for the following components:   RBC 3.97 (*)    Hemoglobin 12.5 (*)    HCT 38.1 (*)    Monocytes Absolute 1.1 (*)    All other components within normal limits  URINALYSIS, COMPLETE (UACMP) WITH MICROSCOPIC - Abnormal; Notable for the following components:   Color, Urine YELLOW (*)    APPearance HAZY (*)    Hgb urine dipstick SMALL (*)    Leukocytes, UA LARGE (*)    All other components within normal limits  URINE CULTURE  TROPONIN I   ____________________________________________  EKG  ED ECG REPORT I, Rudene Re, the attending physician, personally viewed and interpreted this ECG.  Normal sinus rhythm, rate of 61, RBBB, LPFB, RAD, no ST elevations or depressions.  Unchanged from prior from 2013 ____________________________________________  RADIOLOGY  I have personally reviewed the images performed during this visit and I agree with the Radiologist's read.   Interpretation by Radiologist:  Ct Head Wo Contrast  Result Date: 07/16/2017 CLINICAL DATA:  Unwitnessed fall. Possible head injury. Anticoagulated. Dementia. EXAM: CT HEAD WITHOUT CONTRAST CT CERVICAL SPINE WITHOUT CONTRAST TECHNIQUE: Multidetector CT imaging of the head and cervical spine was performed following the standard protocol without intravenous contrast. Multiplanar CT image reconstructions of the cervical spine were also generated. COMPARISON:  05/19/2017 head CT.  10/19/2016 neck CT angiogram. FINDINGS: CT HEAD FINDINGS Brain: Stable right frontoparietal encephalomalacia. No evidence of parenchymal hemorrhage or extra-axial fluid collection. No mass lesion, mass effect, or midline shift. No CT evidence of acute infarction. Generalized cerebral volume loss. Nonspecific mild to moderate subcortical and periventricular white matter hypodensity, most in keeping with chronic small vessel ischemic change. Cerebral ventricle sizes are stable and concordant with the degree of cerebral volume loss. Vascular: No acute  abnormality. Skull: No evidence of calvarial fracture. Sinuses/Orbits: Complete opacification of the right maxillary and right frontal sinuses. Near complete opacification of the right ethmoidal air cells. Partial opacification of the left ethmoidal air cells. Asymmetric hyperostosis of the right maxillary sinus walls. These findings are not appreciably changed. No fluid levels.  Other:  The mastoid air cells are unopacified. CT CERVICAL SPINE FINDINGS Alignment: Mild straightening of the cervical spine. No facet subluxation. Dens is well positioned between the lateral masses of C1. Skull base and vertebrae: No acute fracture. No primary bone lesion or focal pathologic process. Soft tissues and spinal canal: No prevertebral edema. No visible canal hematoma. Disc levels: Marked multilevel cervical degenerative disc disease. Mild-to-moderate bilateral facet arthropathy. Mild degenerative foraminal stenosis on the left at C2-3. Moderate degenerative foraminal stenosis on the right at C3-4. Moderate to severe right and severe left degenerative foraminal stenosis at C4-5. Mild bilateral degenerative foraminal stenosis at C5-6. Mild-to-moderate right degenerative foraminal stenosis at C6-7. Upper chest: No acute abnormality. Other: Visualized mastoid air cells appear clear. No discrete thyroid nodules. No pathologically enlarged cervical nodes. IMPRESSION: CT HEAD: 1. No evidence of acute intracranial abnormality. No evidence of calvarial fracture. 2. Stable right frontoparietal encephalomalacia. 3. Generalized cerebral volume loss. Chronic small vessel ischemic changes in the cerebral white matter. 4. Prominent chronic right paranasal sinusitis. CT CERVICAL SPINE: 1. No cervical spine fracture or subluxation. 2. Advanced multilevel degenerative changes in the cervical spine as detailed. Electronically Signed   By: Ilona Sorrel M.D.   On: 07/16/2017 22:00   Ct Cervical Spine Wo Contrast  Result Date:  07/16/2017 CLINICAL DATA:  Unwitnessed fall. Possible head injury. Anticoagulated. Dementia. EXAM: CT HEAD WITHOUT CONTRAST CT CERVICAL SPINE WITHOUT CONTRAST TECHNIQUE: Multidetector CT imaging of the head and cervical spine was performed following the standard protocol without intravenous contrast. Multiplanar CT image reconstructions of the cervical spine were also generated. COMPARISON:  05/19/2017 head CT.  10/19/2016 neck CT angiogram. FINDINGS: CT HEAD FINDINGS Brain: Stable right frontoparietal encephalomalacia. No evidence of parenchymal hemorrhage or extra-axial fluid collection. No mass lesion, mass effect, or midline shift. No CT evidence of acute infarction. Generalized cerebral volume loss. Nonspecific mild to moderate subcortical and periventricular white matter hypodensity, most in keeping with chronic small vessel ischemic change. Cerebral ventricle sizes are stable and concordant with the degree of cerebral volume loss. Vascular: No acute abnormality. Skull: No evidence of calvarial fracture. Sinuses/Orbits: Complete opacification of the right maxillary and right frontal sinuses. Near complete opacification of the right ethmoidal air cells. Partial opacification of the left ethmoidal air cells. Asymmetric hyperostosis of the right maxillary sinus walls. These findings are not appreciably changed. No fluid levels. Other:  The mastoid air cells are unopacified. CT CERVICAL SPINE FINDINGS Alignment: Mild straightening of the cervical spine. No facet subluxation. Dens is well positioned between the lateral masses of C1. Skull base and vertebrae: No acute fracture. No primary bone lesion or focal pathologic process. Soft tissues and spinal canal: No prevertebral edema. No visible canal hematoma. Disc levels: Marked multilevel cervical degenerative disc disease. Mild-to-moderate bilateral facet arthropathy. Mild degenerative foraminal stenosis on the left at C2-3. Moderate degenerative foraminal stenosis  on the right at C3-4. Moderate to severe right and severe left degenerative foraminal stenosis at C4-5. Mild bilateral degenerative foraminal stenosis at C5-6. Mild-to-moderate right degenerative foraminal stenosis at C6-7. Upper chest: No acute abnormality. Other: Visualized mastoid air cells appear clear. No discrete thyroid nodules. No pathologically enlarged cervical nodes. IMPRESSION: CT HEAD: 1. No evidence of acute intracranial abnormality. No evidence of calvarial fracture. 2. Stable right frontoparietal encephalomalacia. 3. Generalized cerebral volume loss. Chronic small vessel ischemic changes in the cerebral white matter. 4. Prominent chronic right paranasal sinusitis. CT CERVICAL SPINE: 1. No cervical spine fracture or subluxation. 2. Advanced multilevel degenerative  changes in the cervical spine as detailed. Electronically Signed   By: Ilona Sorrel M.D.   On: 07/16/2017 22:00      ____________________________________________   PROCEDURES  Procedure(s) performed: None Procedures Critical Care performed:  None ____________________________________________   INITIAL IMPRESSION / ASSESSMENT AND PLAN / ED COURSE  84 y.o. male a history of advanced dementia, a flutter on Eliquis, CVA who presents from his nursing home for a unwitnessed fall.  No obvious trauma on exam.  Patient is currently at his baseline per wife was at the bedside.  Since patient is on blood thinners and nonverbal will do CT head and neck to rule out acute injury.  There is no signs or symptoms of basilar skull fracture.  There is no other injuries noted on exam.  Since fall was unwitnessed will check EKG, basic labs and urinalysis to rule out dehydration, acute kidney injury, infection, electrolyte abnormalities.    _________________________ 11:11 PM on 07/16/2017 -----------------------------------------  UA positive for UTI.  No evidence of sepsis with no tachycardia, no fever, normal white count.  Kidney function  is within baseline.  Patient was given Keflex.  CMP is within baseline.  Troponin is negative.  CT head and cervical spine with no acute findings.  Findings were discussed with patient his wife was at the bedside.  Patient is going to be discharged back to the skilled nursing facility.  I discussed with her return precautions for any signs of pyelonephritis or sepsis or delayed head bleed.   As part of my medical decision making, I reviewed the following data within the [place] notes reviewed and incorporated, Labs reviewed , EKG interpreted , Old EKG reviewed, Old chart reviewed, Radiograph reviewed , Discussed with admitting physician , Notes from prior ED visits and [place] Controlled Substance Database    Pertinent labs & imaging results that were available during my care of the patient were reviewed by me and considered in my medical decision making (see chart for details).    ____________________________________________   FINAL CLINICAL IMPRESSION(S) / ED DIAGNOSES  Final diagnoses:  Fall, initial encounter  Acute cystitis with hematuria      NEW MEDICATIONS STARTED DURING THIS VISIT:  ED Discharge Orders        Ordered    cephALEXin (KEFLEX) 500 MG capsule  3 times daily     07/16/17 2308       Note:  This document was prepared using Dragon voice recognition software and may include unintentional dictation errors.    Alfred Levins, [place], MD 07/16/17 [phone]

## 2017-07-17 NOTE — ED Notes (Signed)
Pt will not take keflex, hitting staff and spouse in attempt to place keflex with applesauce and po fluids in mouth. Order for im medication received.

## 2017-07-17 NOTE — ED Notes (Signed)
Report to edna at [facility].

## 2017-07-19 LAB — URINE CULTURE

## 2017-07-20 NOTE — Progress Notes (Signed)
ED Antimicrobial Stewardship Positive Culture Follow Up   Kelly Lloyd is an 84 y.o. male who presented to [facility] on 07/16/2017 with a chief complaint of fall. Discharge diagnosis reported as acute cystitis and hematuria.   Chief Complaint  Patient presents with  . Fall    Recent Results (from the past 720 hour(s))  Urine Culture     Status: Abnormal   Collection Time: 07/16/17 10:37 PM  Result Value Ref Range Status   Specimen Description   Final    URINE, RANDOM Performed at [facility], [address], [place], [place] [address]    Special Requests   Final    NONE Performed at [facility], [place], [place] [address]    Culture 50,000 COLONIES/mL ENTEROCOCCUS FAECALIS (A)  Final   Report Status 07/19/2017 FINAL  Final   Organism ID, Bacteria ENTEROCOCCUS FAECALIS (A)  Final      Susceptibility   Enterococcus faecalis - MIC*    AMPICILLIN <=2 SENSITIVE Sensitive     LEVOFLOXACIN 2 SENSITIVE Sensitive     NITROFURANTOIN <=16 SENSITIVE Sensitive     VANCOMYCIN 1 SENSITIVE Sensitive     * 50,000 COLONIES/mL ENTEROCOCCUS FAECALIS    [x]  Treated with cephalexin, organism resistant to prescribed antimicrobial UA: large leukocytes and WBC too numerous to count    New antibiotic prescription:   Amoxicillin 500 mg  1 capsule three times a day for 7 days  #21   ED Provider: Dr. Jimmye Norman   I discussed with Glenard Haring at [facility] switching patient's antibiotic from cephalexin to amoxicillin. Faxed new amoxicillin prescription over to her.    Candelaria Stagers, PharmD Pharmacy Resident  07/20/2017, 2:25 PM

## 2017-07-25 ENCOUNTER — Emergency Department: Payer: Medicare Other

## 2017-07-25 ENCOUNTER — Emergency Department
Admission: EM | Admit: 2017-07-25 | Discharge: 2017-07-25 | Disposition: A | Discharge status: Home / Self Care | Payer: Medicare Other | Attending: Student in an Organized Health Care Education/Training Program | Admitting: Student in an Organized Health Care Education/Training Program

## 2017-07-25 DIAGNOSIS — Z7901 Long term (current) use of anticoagulants: Secondary | ICD-10-CM | POA: Diagnosis not present

## 2017-07-25 DIAGNOSIS — Y999 Unspecified external cause status: Secondary | ICD-10-CM | POA: Diagnosis not present

## 2017-07-25 DIAGNOSIS — G309 Alzheimer's disease, unspecified: Secondary | ICD-10-CM | POA: Diagnosis not present

## 2017-07-25 DIAGNOSIS — Y939 Activity, unspecified: Secondary | ICD-10-CM | POA: Insufficient documentation

## 2017-07-25 DIAGNOSIS — F0280 Dementia in other diseases classified elsewhere without behavioral disturbance: Secondary | ICD-10-CM | POA: Diagnosis not present

## 2017-07-25 DIAGNOSIS — S0990XA Unspecified injury of head, initial encounter: Secondary | ICD-10-CM | POA: Diagnosis present

## 2017-07-25 DIAGNOSIS — W19XXXA Unspecified fall, initial encounter: Secondary | ICD-10-CM | POA: Insufficient documentation

## 2017-07-25 DIAGNOSIS — Z79899 Other long term (current) drug therapy: Secondary | ICD-10-CM | POA: Diagnosis not present

## 2017-07-25 DIAGNOSIS — Y92129 Unspecified place in nursing home as the place of occurrence of the external cause: Secondary | ICD-10-CM | POA: Diagnosis not present

## 2017-07-25 DIAGNOSIS — I69354 Hemiplegia and hemiparesis following cerebral infarction affecting left non-dominant side: Secondary | ICD-10-CM | POA: Insufficient documentation

## 2017-07-25 NOTE — ED Triage Notes (Signed)
Pt brought in by [facility] from [facility] after 2 falls this morning.  Pt recently treated for UTI.  Due to patient's vitals being WNL. EDP given VO to hold off on bloodwork at this time.  Pt is at baseline per EMS.  Pt unable to answer any questions at this time.  Per EMS, wife should be arrive to hospital shortly.

## 2017-07-25 NOTE — ED Provider Notes (Signed)
[facility] Emergency Department Provider Note    First MD Initiated Contact with Patient 07/25/17 1128     (approximate)  I have reviewed the triage vital signs and the nursing notes.   HISTORY  Chief Complaint Fall  Level V Caveat:  alzheimers dementia  HPI Kelly Lloyd is a 84 y.o. male with Alzheimer's dementia presents from [place] after 2 witnessed falls.  Patient was recently treated for UTI and is currently on amoxicillin.  No report of any fevers.  Limited additional history provided by EMS.  No report of any fever.  Patient reportedly is on Eliquis.  Does arrive with a DNR.  Does seem to be guarding his left shoulder  Past Medical History:  Diagnosis Date  . Alzheimer's dementia   . Atrial flutter ([place])    aflutter s/p RFCA  . Benign prostatic hypertrophy   . BPH (benign prostatic hyperplasia)   . Diverticulosis of colon   . Stroke ([place]) 10/17/2016   "can't use his left arm; LLE seems weaker" (10/21/2016)   Family History  Problem Relation Age of Onset  . Thyroid disease Unknown        family Hx  . Stroke Neg Hx        neg hx of CV  . Diabetes Neg Hx        DM; also neg hx of HBP  . Cancer Neg Hx        breast, ovarian, uterine  . Depression Neg Hx   . Alcohol abuse Neg Hx        no drug abuse    Past Surgical History:  Procedure Laterality Date  . [place]  . ATRIAL FLUTTER ABLATION  02/07/2009   aflutter ablation. TEE EF 55% 02/06/09  . audiol eval  06/07/02  . BACK SURGERY  2008   "disc surgery; not sure what part of back"  . COLONOSCOPY  03/08/06   2 polyps, divertics, int ext hemorrhoids  . HEMORRHOID SURGERY  03/08/06   [place]  . [place]   at the [facility] Administration/ Hospitalnotes 09/01/2010  . HERNIA REPAIR  04/07/10   Dr. Bary Castilla  . [place]  . MRI INT  2/204   auit canals wnl except Mastoiditis  . TRANSURETHRAL RESECTION OF PROSTATE  2011    Patient Active Problem List   Diagnosis Date Noted  . Cerebrovascular accident (CVA) due to embolism of right middle cerebral artery ([place]) 01/05/2017  . Advanced dementia 01/05/2017  . Stenosis of right carotid artery 01/05/2017  . Intracranial vascular stenosis 01/05/2017  . 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 Atrial flutter ([place])   . 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 Weight loss 10/18/2016  . Paroxysmal atrial fibrillation ([place]) 10/18/2016  . Status post catheter ablation of atrial fibrillation 10/18/2016  . Alzheimer's disease 10/18/2016  . Acute ischemic stroke (HCC) - R MCA scattered infarcts, unknown etiology 10/17/2016  . Anal stricture 04/17/2014  . Rectal pain 04/17/2014  . Unspecified constipation 08/30/2012  . Medicare annual wellness visit, initial 01/19/2012  . Medically noncompliant 01/19/2012  . Post-nasal drip 01/19/2012  . Hemorrhoid 06/24/2011  . PENILE LESION 02/12/2010  . 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 HYPERCHOLESTEROLEMIA 02/10/2010  . Memory loss 02/05/2009  . DIVERTICULOSIS, COLON 12/15/2006  . BENIGN PROSTATIC HYPERTROPHY 12/15/2006  . OSTEOARTHRITIS, GENERALIZED, MULTIPLE JOINTS 12/15/2006  Prior to Admission medications   Medication Sig Start Date End Date Taking? Authorizing Provider  apixaban (ELIQUIS) 5 MG TABS tablet Take 1 tablet (5 mg total) by mouth 2 (two) times daily. 11/08/16   Love, Ivan Anchors, PA-C  donepezil (ARICEPT) 10 MG tablet Take 1 tablet (10 mg total) by mouth daily. 11/08/16   Love, Ivan Anchors, PA-C  ENSURE PLUS (ENSURE PLUS) LIQD Take 237 mLs by mouth 3 (three) times daily between meals.    [provider]  finasteride (PROSCAR) 5 MG tablet Take 5 mg by mouth daily.    [provider]  LORazepam (ATIVAN) 0.5 MG tablet 0.5 mg 2 (two) times daily as needed.  12/10/16   [provider]  Melatonin 5 MG CAPS Take 5 mg  by mouth at bedtime.    [provider]  memantine (NAMENDA) 10 MG tablet Take 1 tablet (10 mg total) by mouth 2 (two) times daily. 11/08/16   Love, Ivan Anchors, PA-C  mirtazapine (REMERON) 7.5 MG tablet Take 7.5 mg by mouth at bedtime.    [provider]  traZODone (DESYREL) 50 MG tablet Take 25-50 mg by mouth 2 (two) times daily. Take  tablet (25MG ) every morning and 1 tablet (50MG ) nightly at bedtime    [provider]    Allergies Patient has no known allergies.    Social History Social History   Tobacco Use  . Smoking status: Former Smoker    Packs/day: 1.00    Years: 24.00    Pack years: 24.00    Last attempt to quit: 01/12/1974    Years since quitting: 43.5  . Smokeless tobacco: Never Used  . Tobacco comment: quit in 1975  Substance Use Topics  . Alcohol use: No    Comment: 10/21/2016 "used to have 1 beer/month; probably hasn't had one in the last year"  . Drug use: No    Review of Systems Patient denies headaches, rhinorrhea, blurry vision, numbness, shortness of breath, chest pain, edema, cough, abdominal pain, nausea, vomiting, diarrhea, dysuria, fevers, rashes or hallucinations unless otherwise stated above in HPI. ____________________________________________   PHYSICAL EXAM:  VITAL SIGNS: Vitals:   07/25/17 1140  BP: 127/72  Pulse: 78  Resp: 18  Temp: 99.1 F (37.3 C)  SpO2: 99%    Constitutional: alert, disoriented x 3. Non toxic but chronically ill appearing, in no acute distress. Eyes: Conjunctivae are normal.  Head: Atraumatic. Nose: No congestion/rhinnorhea. Mouth/Throat: Mucous membranes are moist.   Neck: No stridor. Painless ROM.  Cardiovascular: Normal rate, regular rhythm. Grossly normal heart sounds.  Good peripheral circulation. Respiratory: Normal respiratory effort.  No retractions. Lungs CTAB. Gastrointestinal: Soft and nontender. No distention. No abdominal bruits. No CVA tenderness. Genitourinary: normal  external genitalia Musculoskeletal: LUE ttp of proximal humerus, no deformity but multiple scattered skin tears all chronic appearing.  NO ttp of hip.  No lower extremity tenderness nor edema.  No joint effusions. Neurologic:  Normal speech and language. No gross focal neurologic deficits are appreciated. No facial droop Skin:  Skin is warm, dry and intact. No rash noted. Psychiatric: Mood and affect are normal. Speech and behavior are normal.  ____________________________________________   LABS (all labs ordered are listed, but only abnormal results are displayed)  No results found for this or any previous visit (from the past 24 hour(s)). ____________________________________________ ED ECG REPORT I, Merlyn Lot, the attending physician, personally viewed and interpreted this ECG.   Date: 07/25/2017  EKG Time: 11:25  Rate: 85  Rhythm: sinus  Axis: normal  Intervals: normal qt, rbbb  ST&T Change: no stemi,   ____________________________________________  RADIOLOGY  I personally reviewed all radiographic images ordered to evaluate for the above acute complaints and reviewed radiology reports and findings.  These findings were personally discussed with the patient.  Please see medical record for radiology report.  ____________________________________________   PROCEDURES  Procedure(s) performed:  Procedures    Critical Care performed: no ____________________________________________   INITIAL IMPRESSION / ASSESSMENT AND PLAN / ED COURSE  Pertinent labs & imaging results that were available during my care of the patient were reviewed by me and considered in my medical decision making (see chart for details).   DDX: dementia, deconditioning, fracture, contusion  Kelly Lloyd is a 84 y.o. who presents to the ED with 2 witnessed falls this morning.  Vital signs are stable.  He is afebrile.  Already on antibiotics for UTI.  Dental exam is soft and benign.  Will order  x-rays of left shoulder as well as chest as well as CT head to evaluate for traumatic injury.  Clinical Course as of Jul 26 1246  Mon Jul 25, 2017  1226 Patient reassessed.  Now much more upbeat and interactive now that his wife is in the room.  She confirms that it was witnessed mechanical fall and has been having more frequent falls over the past several weeks.   [PR]  1247 Radiographs are reassuring.  Patient remains in no acute distress.  Able to tolerate oral hydration.  At this point do believe patient stable and appropriate for discharge back to facility.   [PR]    Clinical Course User Index [PR] Merlyn Lot, MD     As part of my medical decision making, I reviewed the following data within the [facility] notes reviewed and incorporated, Labs reviewed, notes from prior ED visits and [place] Controlled Substance Database   ____________________________________________   FINAL CLINICAL IMPRESSION(S) / ED DIAGNOSES  Final diagnoses:  Fall, initial encounter  Minor head injury, initial encounter      NEW MEDICATIONS STARTED DURING THIS VISIT:  New Prescriptions   No medications on file     Note:  This document was prepared using Dragon voice recognition software and may include unintentional dictation errors.    Merlyn Lot, MD 07/25/17 1248

## 2017-07-26 ENCOUNTER — Emergency Department: Payer: Medicare Other

## 2017-07-26 ENCOUNTER — Encounter: Payer: Self-pay | Admitting: Emergency Medicine

## 2017-07-26 ENCOUNTER — Emergency Department
Admission: EM | Admit: 2017-07-26 | Discharge: 2017-07-26 | Disposition: A | Discharge status: Skilled Nursing Facility | Payer: Medicare Other | Attending: Emergency Medicine | Admitting: Emergency Medicine

## 2017-07-26 DIAGNOSIS — W19XXXA Unspecified fall, initial encounter: Secondary | ICD-10-CM | POA: Diagnosis not present

## 2017-07-26 DIAGNOSIS — Z7901 Long term (current) use of anticoagulants: Secondary | ICD-10-CM | POA: Diagnosis not present

## 2017-07-26 DIAGNOSIS — Z87891 Personal history of nicotine dependence: Secondary | ICD-10-CM | POA: Insufficient documentation

## 2017-07-26 DIAGNOSIS — S0003XA Contusion of scalp, initial encounter: Secondary | ICD-10-CM | POA: Insufficient documentation

## 2017-07-26 DIAGNOSIS — T148XXA Other injury of unspecified body region, initial encounter: Secondary | ICD-10-CM

## 2017-07-26 DIAGNOSIS — S50312A Abrasion of left elbow, initial encounter: Secondary | ICD-10-CM | POA: Insufficient documentation

## 2017-07-26 DIAGNOSIS — I4891 Unspecified atrial fibrillation: Secondary | ICD-10-CM | POA: Diagnosis not present

## 2017-07-26 DIAGNOSIS — F0280 Dementia in other diseases classified elsewhere without behavioral disturbance: Secondary | ICD-10-CM | POA: Diagnosis not present

## 2017-07-26 DIAGNOSIS — Z79899 Other long term (current) drug therapy: Secondary | ICD-10-CM | POA: Insufficient documentation

## 2017-07-26 DIAGNOSIS — G309 Alzheimer's disease, unspecified: Secondary | ICD-10-CM | POA: Diagnosis not present

## 2017-07-26 DIAGNOSIS — Y999 Unspecified external cause status: Secondary | ICD-10-CM | POA: Diagnosis not present

## 2017-07-26 DIAGNOSIS — S0990XA Unspecified injury of head, initial encounter: Secondary | ICD-10-CM

## 2017-07-26 DIAGNOSIS — Y9389 Activity, other specified: Secondary | ICD-10-CM | POA: Insufficient documentation

## 2017-07-26 DIAGNOSIS — Y92128 Other place in nursing home as the place of occurrence of the external cause: Secondary | ICD-10-CM | POA: Insufficient documentation

## 2017-07-26 HISTORY — DX: Unspecified atrial fibrillation: I48.91

## 2017-07-26 LAB — CBC
HEMATOCRIT: 40.1 % (ref 40.0–52.0)
Hemoglobin: 13.1 g/dL (ref 13.0–18.0)
MCH: 31.1 pg (ref 26.0–34.0)
MCHC: 32.7 g/dL (ref 32.0–36.0)
MCV: 95 fL (ref 80.0–100.0)
Platelets: 324 10*3/uL (ref 150–440)
RBC: 4.22 MIL/uL — AB (ref 4.40–5.90)
RDW: 12.9 % (ref 11.5–14.5)
WBC: 12.1 10*3/uL — AB (ref 3.8–10.6)

## 2017-07-26 LAB — URINALYSIS, COMPLETE (UACMP) WITH MICROSCOPIC
BACTERIA UA: NONE SEEN
BILIRUBIN URINE: NEGATIVE
Glucose, UA: NEGATIVE mg/dL
Hgb urine dipstick: NEGATIVE
KETONES UR: NEGATIVE mg/dL
LEUKOCYTES UA: NEGATIVE
NITRITE: NEGATIVE
PH: 7 (ref 5.0–8.0)
Protein, ur: NEGATIVE mg/dL
Specific Gravity, Urine: 1.012 (ref 1.005–1.030)
Squamous Epithelial / LPF: NONE SEEN

## 2017-07-26 LAB — BASIC METABOLIC PANEL
ANION GAP: 6 (ref 5–15)
BUN: 18 mg/dL (ref 6–20)
CO2: 30 mmol/L (ref 22–32)
Calcium: 9.6 mg/dL (ref 8.9–10.3)
Chloride: 102 mmol/L (ref 101–111)
Creatinine, Ser: 1.2 mg/dL (ref 0.61–1.24)
GFR calc Af Amer: >60 mL/min (ref >60)
GFR calc non Af Amer: 53 mL/min — ABNORMAL LOW (ref >60)
GLUCOSE: 132 mg/dL — AB (ref 65–99)
POTASSIUM: 4.1 mmol/L (ref 3.5–5.1)
Sodium: 138 mmol/L (ref 135–145)

## 2017-07-26 LAB — TROPONIN I: Troponin I: <0.03 ng/mL (ref <0.03)

## 2017-07-26 MED ORDER — BACITRACIN ZINC 500 UNIT/GM EX OINT
TOPICAL_OINTMENT | CUTANEOUS | Status: AC
  Filled 2017-07-26: qty 0.9

## 2017-07-26 NOTE — ED Notes (Signed)
Patient transported to CT 

## 2017-07-26 NOTE — ED Notes (Signed)
Pt. Returned to tx. room in stable condition with no acute changes since departure from unit for scans.   Family at bedside

## 2017-07-26 NOTE — ED Provider Notes (Signed)
[facility] Emergency Department Provider Note   ____________________________________________   First MD Initiated Contact with Patient 07/26/17 0010     (approximate)  I have reviewed the triage vital signs and the nursing notes.   HISTORY  Chief Complaint Fall and Laceration  Patient with history of dementia and unable to provide history.  HPI Kelly Lloyd is a 84 y.o. male who comes into the hospital today after an unwitnessed fall at the nursing home.  The patient was found on the floor by the staff at his nursing home.  According to EMS the patient has had 3 falls today and was in the emergency department earlier after a fall.  The patient is at his baseline per EMS but the patient is not speaking right now.  He has a lump to the back of his head and he is bleeding so there is a concern for possible laceration.  The patient is on Eliquis and has a history of atrial fibrillation.   Past Medical History:  Diagnosis Date  . Afib ([place])   . Alzheimer's dementia   . Atrial flutter ([place])    aflutter s/p RFCA  . Benign prostatic hypertrophy   . BPH (benign prostatic hyperplasia)   . Diverticulosis of colon   . Stroke ([place]) 10/17/2016   "can't use his left arm; LLE seems weaker" (10/21/2016)    Patient Active Problem List   Diagnosis Date Noted  . Cerebrovascular accident (CVA) due to embolism of right middle cerebral artery ([place]) 01/05/2017  . Advanced dementia 01/05/2017  . Stenosis of right carotid artery 01/05/2017  . Intracranial vascular stenosis 01/05/2017  . 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 Atrial flutter ([place])   . 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 Weight loss 10/18/2016  . Paroxysmal atrial fibrillation ([place]) 10/18/2016  . Status post catheter ablation of atrial fibrillation 10/18/2016  . Alzheimer's disease 10/18/2016  . Acute ischemic stroke (HCC) - R MCA  scattered infarcts, unknown etiology 10/17/2016  . Anal stricture 04/17/2014  . Rectal pain 04/17/2014  . Unspecified constipation 08/30/2012  . Medicare annual wellness visit, initial 01/19/2012  . Medically noncompliant 01/19/2012  . Post-nasal drip 01/19/2012  . Hemorrhoid 06/24/2011  . PENILE LESION 02/12/2010  . 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 HYPERCHOLESTEROLEMIA 02/10/2010  . Memory loss 02/05/2009  . DIVERTICULOSIS, COLON 12/15/2006  . BENIGN PROSTATIC HYPERTROPHY 12/15/2006  . OSTEOARTHRITIS, GENERALIZED, MULTIPLE JOINTS 12/15/2006    Past Surgical History:  Procedure Laterality Date  . [place]  . ATRIAL FLUTTER ABLATION  02/07/2009   aflutter ablation. TEE EF 55% 02/06/09  . audiol eval  06/07/02  . BACK SURGERY  2008   "disc surgery; not sure what part of back"  . COLONOSCOPY  03/08/06   2 polyps, divertics, int ext hemorrhoids  . HEMORRHOID SURGERY  03/08/06   [place]  . [place]   [facility] 09/01/2010  . HERNIA REPAIR  04/07/10   Dr. Bary Castilla  . [place]  . MRI INT  2/204   auit canals wnl except Mastoiditis  . TRANSURETHRAL RESECTION OF PROSTATE  2011    Prior to Admission medications   Medication Sig Start Date End Date Taking? Authorizing Provider  apixaban (ELIQUIS) 5 MG TABS tablet Take 1 tablet (5 mg total) by mouth 2 (two) times daily. 11/08/16   Love,  Ivan Anchors, PA-C  donepezil (ARICEPT) 10 MG tablet Take 1 tablet (10 mg total) by mouth daily. 11/08/16   Love, Ivan Anchors, PA-C  ENSURE PLUS (ENSURE PLUS) LIQD Take 237 mLs by mouth 3 (three) times daily between meals.    [provider]  finasteride (PROSCAR) 5 MG tablet Take 5 mg by mouth daily.    [provider]  LORazepam (ATIVAN) 0.5 MG tablet 0.5 mg 2 (two) times daily as needed.  12/10/16   [provider]  Melatonin 5 MG CAPS Take 5 mg by mouth at bedtime.    [provider]  memantine (NAMENDA) 10 MG tablet Take 1 tablet (10 mg total) by mouth 2 (two) times daily. 11/08/16   Love, Ivan Anchors, PA-C  mirtazapine (REMERON) 7.5 MG tablet Take 7.5 mg by mouth at bedtime.    [provider]  traZODone (DESYREL) 50 MG tablet Take 25-50 mg by mouth 2 (two) times daily. Take  tablet (25MG ) every morning and 1 tablet (50MG ) nightly at bedtime    [provider]    Allergies Patient has no known allergies.  Family History  Problem Relation Age of Onset  . Thyroid disease Unknown        family Hx  . Stroke Neg Hx        neg hx of CV  . Diabetes Neg Hx        DM; also neg hx of HBP  . Cancer Neg Hx        breast, ovarian, uterine  . Depression Neg Hx   . Alcohol abuse Neg Hx        no drug abuse     Social History Social History   Tobacco Use  . Smoking status: Former Smoker    Packs/day: 1.00    Years: 24.00    Pack years: 24.00    Last attempt to quit: 01/12/1974    Years since quitting: 43.5  . Smokeless tobacco: Never Used  . Tobacco comment: quit in 1975  Substance Use Topics  . Alcohol use: No    Comment: 10/21/2016 "used to have 1 beer/month; probably hasn't had one in the last year"  . Drug use: No    Review of Systems  Unable to assess due to patient dementia and not answering questions  ____________________________________________   PHYSICAL EXAM:  VITAL SIGNS: ED Triage Vitals     07/26/17  0014  Enc Vitals Group     BP 151/77     Pulse 85     Resp 17     Temp 98.4     Temp src      SpO2 97% on RA     Weight      Height      Head Circumference      Peak Flow      Pain Score      Pain Loc      Pain Edu?      Excl. in [place]?     Constitutional: Alert but not answering questions unable to assess orientation well appearing and in no acute distress. Eyes: Conjunctivae are normal. PERRL. EOMI. Pupils pinpoint Head: Hematoma to left posterior scalp with some bleeding. Nose: No  congestion/rhinnorhea. Mouth/Throat: Mucous membranes are dry.   Neck: No cervical spine tenderness to palpation. Cardiovascular: Irregularly irregular rhythm. Grossly normal heart sounds.  Good peripheral circulation. Respiratory: Normal respiratory effort.  No retractions. Lungs CTAB. Gastrointestinal: Soft and nontender. No distention.  Positive  bowel sounds Musculoskeletal: No lower extremity tenderness nor edema.   Neurologic: Patient with eyes closed, when we attempt to examine the patient or move the patient he does groan and disagreement but he does not follow commands.  The patient is moving his extremities independently. Skin:  Skin is warm, dry, old appearing skin tears to left forearm and elbow Psychiatric: Patient uncooperative.  Attempting to cover his head with a blanket and not answering questions.  ____________________________________________   LABS (all labs ordered are listed, but only abnormal results are displayed)  Labs Reviewed  CBC - Abnormal; Notable for the following components:      Result Value   WBC 12.1 (*)    RBC 4.22 (*)    All other components within normal limits  BASIC METABOLIC PANEL - Abnormal; Notable for the following components:   Glucose, Bld 132 (*)    GFR calc non Af Amer 53 (*)    All other components within normal limits  URINALYSIS, COMPLETE (UACMP) WITH MICROSCOPIC - Abnormal; Notable for the following components:   Color, Urine YELLOW (*)    APPearance CLEAR (*)    All other components within normal limits  TROPONIN I   ____________________________________________  EKG  ED ECG REPORT I, Loney Hering, the attending physician, personally viewed and interpreted this ECG.   Date: 07/26/2017  EKG Time: 0029  Rate: 83  Rhythm: normal sinus rhythm  Axis: normal  Intervals:right bundle branch block  ST&T Change: flipped t waves in leads V2, V3  ____________________________________________  RADIOLOGY  ED MD  interpretation:  CT head and cervical spine: No acute intracranial abnormality, no skull fracture, unchanged atrophy, degenerative change of the cervical spine without acute fracture or subluxation.  Official radiology report(s): Ct Head Wo Contrast  Result Date: 07/26/2017 CLINICAL DATA:  Head trauma, minor, GCS>=13, low clinical risk, initial exam. Three falls today. EXAM: CT HEAD WITHOUT CONTRAST CT CERVICAL SPINE WITHOUT CONTRAST TECHNIQUE: Multidetector CT imaging of the head and cervical spine was performed following the standard protocol without intravenous contrast. Multiplanar CT image reconstructions of the cervical spine were also generated. COMPARISON:  Head CT yesterday. Head and cervical spine CT 07/16/2017 FINDINGS: CT HEAD FINDINGS Brain: No acute hemorrhage or ischemia. Stable atrophy and chronic small vessel disease. Multifocal remote infarcts in the right and left cerebral hemispheres are again seen. No subdural or extra-axial collection. Vascular: Atherosclerosis of skullbase vasculature without hyperdense vessel or abnormal calcification. Skull: No fracture or focal lesion. Sinuses/Orbits: Chronic sinus disease involving the right maxillary, frontal sinus, and ethmoid air cells. No acute findings. Other: None. CT CERVICAL SPINE FINDINGS Alignment: Mild degenerative anterolisthesis of C7 on T1. No traumatic subluxation. Skull base and vertebrae: No acute fracture. Vertebral body heights are maintained. The dens and skull base are intact. Soft tissues and spinal canal: No prevertebral fluid or swelling. No visible canal hematoma. Disc levels: Disc space narrowing and endplate spurring throughout, most prominent at C4-C5 and C5-C6. Multilevel facet arthropathy. Mild ossification of the posterior longitudinal ligament at C4 on C5 causing spinal canal stenosis at these levels. Multilevel neural foraminal stenosis. Upper chest: Right greater than left apical scarring. Other: Carotid  calcifications. IMPRESSION: 1. No acute intracranial abnormality. No skull fracture. Unchanged atrophy, chronic and remote ischemia. 2. Degenerative change in the cervical spine without acute fracture or subluxation. Electronically Signed   By: Jeb Levering M.D.   On: 07/26/2017 01:46   Ct Head Wo Contrast  Result Date: 07/25/2017 CLINICAL DATA:  Pain following fall EXAM: CT HEAD WITHOUT CONTRAST TECHNIQUE: Contiguous axial images were obtained from the base of the skull through the vertex without intravenous contrast. COMPARISON:  July 16, 2017 FINDINGS: Brain: Moderate diffuse atrophy is stable. There is no evident mass, hemorrhage, extra-axial fluid collection, or midline shift. There is evidence of a prior infarct in the right parietal lobe, stable. There is evidence of a prior infarct in the posterior inferior left cerebellum. There is evidence of a prior infarct in the posterior right cerebellum laterally, somewhat better seen on recent prior study. There is patchy small vessel disease in the centra semiovale bilaterally. No demonstrable acute infarct. Vascular: No hyperdense vessels are appreciable. There is calcification in each carotid siphon region as well as in the right vertebral artery. Skull: The bony calvarium appears intact. Sinuses/Orbits: There is diffuse opacification in the right maxillary antrum with expansion of opacification into the right near ease, likely due to antral choanal polyp. There is extensive opacification in multiple ethmoid air cells as well as throughout much of the right frontal sinus. There is mild mucosal thickening in the left frontal sinus. There is also mild mucosal thickening in each anterior sphenoid sinus. Orbits appear symmetric bilaterally. Other: Mastoid air cells are clear. IMPRESSION: 1. Stable atrophy with prior infarcts and periventricular small vessel disease, unchanged. No acute infarct. No mass or hemorrhage. 2.  Foci of arterial vascular calcification  noted. 3. Extensive paranasal sinus disease. Expansion medially in the right maxillary antrum, likely due to antral choanal polyp or mucocele formation. There is obstruction of the near ease on the right due to this finding. This finding is unchanged compared to the previous study. Electronically Signed   By: Lowella Grip III M.D.   On: 07/25/2017 12:04   Ct Cervical Spine Wo Contrast  Result Date: 07/26/2017 CLINICAL DATA:  Head trauma, minor, GCS>=13, low clinical risk, initial exam. Three falls today. EXAM: CT HEAD WITHOUT CONTRAST CT CERVICAL SPINE WITHOUT CONTRAST TECHNIQUE: Multidetector CT imaging of the head and cervical spine was performed following the standard protocol without intravenous contrast. Multiplanar CT image reconstructions of the cervical spine were also generated. COMPARISON:  Head CT yesterday. Head and cervical spine CT 07/16/2017 FINDINGS: CT HEAD FINDINGS Brain: No acute hemorrhage or ischemia. Stable atrophy and chronic small vessel disease. Multifocal remote infarcts in the right and left cerebral hemispheres are again seen. No subdural or extra-axial collection. Vascular: Atherosclerosis of skullbase vasculature without hyperdense vessel or abnormal calcification. Skull: No fracture or focal lesion. Sinuses/Orbits: Chronic sinus disease involving the right maxillary, frontal sinus, and ethmoid air cells. No acute findings. Other: None. CT CERVICAL SPINE FINDINGS Alignment: Mild degenerative anterolisthesis of C7 on T1. No traumatic subluxation. Skull base and vertebrae: No acute fracture. Vertebral body heights are maintained. The dens and skull base are intact. Soft tissues and spinal canal: No prevertebral fluid or swelling. No visible canal hematoma. Disc levels: Disc space narrowing and endplate spurring throughout, most prominent at C4-C5 and C5-C6. Multilevel facet arthropathy. Mild ossification of the posterior longitudinal ligament at C4 on C5 causing spinal canal  stenosis at these levels. Multilevel neural foraminal stenosis. Upper chest: Right greater than left apical scarring. Other: Carotid calcifications. IMPRESSION: 1. No acute intracranial abnormality. No skull fracture. Unchanged atrophy, chronic and remote ischemia. 2. Degenerative change in the cervical spine without acute fracture or subluxation. Electronically Signed   By: Jeb Levering M.D.   On: 07/26/2017 01:46   Dg Chest Portable 1 View  Result  Date: 07/25/2017 CLINICAL DATA:  Pain following fall EXAM: PORTABLE CHEST 1 VIEW COMPARISON:  May 19, 2017 FINDINGS: There is atelectatic change in the left base with small left pleural effusion. Lungs elsewhere clear. Heart size and pulmonary vascularity are normal. There is aortic atherosclerosis. No adenopathy. There is a stable small radiopaque foreign body in the lateral left chest region. IMPRESSION: Mild left base atelectasis with small left pleural effusion. Lungs elsewhere clear. No pneumothorax. Stable cardiac silhouette. There is aortic atherosclerosis. Aortic Atherosclerosis (ICD10-I70.0). Electronically Signed   By: Lowella Grip III M.D.   On: 07/25/2017 12:38   Dg Humerus Left  Result Date: 07/25/2017 CLINICAL DATA:  Status post fall, left shoulder pain EXAM: LEFT HUMERUS - 2+ VIEW COMPARISON:  None. FINDINGS: There is no evidence of fracture or other focal bone lesions. Soft tissues are unremarkable. IMPRESSION: Negative. Electronically Signed   By: Kathreen Devoid   On: 07/25/2017 12:42    ____________________________________________   PROCEDURES  Procedure(s) performed: None  Procedures  Critical Care performed: No  ____________________________________________   INITIAL IMPRESSION / ASSESSMENT AND PLAN / ED COURSE  As part of my medical decision making, I reviewed the following data within the electronic MEDICAL RECORD NUMBER Notes from prior ED visits and [place] Controlled Substance Database   This is an 84 year old male  who comes into the hospital today from his nursing home after having an unwitnessed fall.  The patient was seen earlier in the day after having a witnessed fall.  Looking at the notes the patient's wife did tell the previous physician that he is been falling more frequently in the last few weeks.  He is also on antibiotics for a urinary tract infection.  The patient did not have any blood work drawn earlier today so I will check some blood work looking for possible infectious or metabolic cause of his falls and I will send the patient for a CT of his head and cervical spine.  The patient will be reassessed.     I did check some blood work on the patient and it was unremarkable.  His urinalysis is unremarkable.  His white blood cell count is 12.1 but otherwise negative and his BMP and troponin are negative.  I did  reassess the patient's hematoma and there is a superficial abrasion over the top but there is no distinct laceration.  He is still at his baseline.  The patient's family was concerned that his medications may be leading to his falls but I did inform them that they need to discuss his medication changes with the physician at the nursing home.  The patient will be sent back to his nursing home. ____________________________________________   FINAL CLINICAL IMPRESSION(S) / ED DIAGNOSES  Final diagnoses:  Injury of head, initial encounter  Hematoma of scalp, initial encounter  Multiple skin tears     ED Discharge Orders    None       Note:  This document was prepared using Dragon voice recognition software and may include unintentional dictation errors.    Loney Hering, MD 07/26/17 [phone]

## 2017-07-26 NOTE — ED Notes (Addendum)
Bacitracin applied to abrasions, covered with gauze/tape. Wife has left, unable to obtain signature for d/c

## 2017-07-26 NOTE — ED Notes (Signed)
Pt. Family verbalizes understanding of d/c instructions and follow-up. VS stable and pain controlled per pt.  Pt. In NAD at time of d/c and family denies further concerns regarding this visit. Pt. Stable at the time of departure from the unit, departing unit by the safest and most appropriate manner per that pt condition and limitations with all belongings accounted for. Pt family advised to return to the ED at any time for emergent concerns, or for new/worsening symptoms.    Report to [place], Med Tech at [facility]. States transport back by EMS

## 2017-07-26 NOTE — Discharge Instructions (Addendum)
Please follow up with your primary care physician for re evaluation of your medications which may be causing your falls as well as evaluation of anticoagulation use with history of frequent falls

## 2017-07-26 NOTE — ED Notes (Addendum)
Patient cleaned and changed.   Head abrasions cleaned. MD updated as to no laceration

## 2017-07-26 NOTE — ED Triage Notes (Signed)
Pt bib [facility] from [facility] for pt 3rd fall today (2nd ER visit). Pt presents with advanced dementia at baseline. EMS reports there was walker in pt room, but not in bathroom where it appears pt fell back and hit head on sink. VSS in route. Pt unable to answer any questions for this RN

## 2017-10-05 ENCOUNTER — Emergency Department: Payer: Medicare Other

## 2017-10-05 ENCOUNTER — Emergency Department
Admission: EM | Admit: 2017-10-05 | Discharge: 2017-10-05 | Disposition: A | Discharge status: Home / Self Care | Payer: Medicare Other | Attending: Emergency Medicine | Admitting: Emergency Medicine

## 2017-10-05 DIAGNOSIS — F0280 Dementia in other diseases classified elsewhere without behavioral disturbance: Secondary | ICD-10-CM | POA: Diagnosis not present

## 2017-10-05 DIAGNOSIS — G459 Transient cerebral ischemic attack, unspecified: Secondary | ICD-10-CM | POA: Insufficient documentation

## 2017-10-05 DIAGNOSIS — Z87891 Personal history of nicotine dependence: Secondary | ICD-10-CM | POA: Diagnosis not present

## 2017-10-05 DIAGNOSIS — G308 Other Alzheimer's disease: Secondary | ICD-10-CM | POA: Diagnosis not present

## 2017-10-05 DIAGNOSIS — Z79899 Other long term (current) drug therapy: Secondary | ICD-10-CM | POA: Diagnosis not present

## 2017-10-05 DIAGNOSIS — R4182 Altered mental status, unspecified: Secondary | ICD-10-CM | POA: Diagnosis present

## 2017-10-05 LAB — CBC WITH DIFFERENTIAL/PLATELET
Basophils Absolute: 0.1 10*3/uL (ref 0–0.1)
Basophils Relative: 1 %
EOS PCT: 2 %
Eosinophils Absolute: 0.2 10*3/uL (ref 0–0.7)
HCT: 37.7 % — ABNORMAL LOW (ref 40.0–52.0)
Hemoglobin: 12.4 g/dL — ABNORMAL LOW (ref 13.0–18.0)
LYMPHS PCT: 16 %
Lymphs Abs: 1.4 10*3/uL (ref 1.0–3.6)
MCH: 31.3 pg (ref 26.0–34.0)
MCHC: 33 g/dL (ref 32.0–36.0)
MCV: 94.8 fL (ref 80.0–100.0)
MONO ABS: 0.9 10*3/uL (ref 0.2–1.0)
Monocytes Relative: 10 %
Neutro Abs: 6.2 10*3/uL (ref 1.4–6.5)
Neutrophils Relative %: 71 %
PLATELETS: 385 10*3/uL (ref 150–440)
RBC: 3.97 MIL/uL — ABNORMAL LOW (ref 4.40–5.90)
RDW: 13 % (ref 11.5–14.5)
WBC: 8.6 10*3/uL (ref 3.8–10.6)

## 2017-10-05 LAB — COMPREHENSIVE METABOLIC PANEL
ALK PHOS: 177 U/L — AB (ref 38–126)
ALT: 27 U/L (ref 17–63)
AST: 49 U/L — AB (ref 15–41)
Albumin: 3.6 g/dL (ref 3.5–5.0)
Anion gap: 10 (ref 5–15)
BILIRUBIN TOTAL: 0.3 mg/dL (ref 0.3–1.2)
BUN: 26 mg/dL — AB (ref 6–20)
CHLORIDE: 101 mmol/L (ref 101–111)
CO2: 25 mmol/L (ref 22–32)
CREATININE: 1.46 mg/dL — AB (ref 0.61–1.24)
Calcium: 9.3 mg/dL (ref 8.9–10.3)
GFR calc Af Amer: 48 mL/min — ABNORMAL LOW (ref >60)
GFR calc non Af Amer: 41 mL/min — ABNORMAL LOW (ref >60)
Glucose, Bld: 101 mg/dL — ABNORMAL HIGH (ref 65–99)
Potassium: 4.8 mmol/L (ref 3.5–5.1)
Sodium: 136 mmol/L (ref 135–145)
TOTAL PROTEIN: 7.8 g/dL (ref 6.5–8.1)

## 2017-10-05 LAB — URINALYSIS, COMPLETE (UACMP) WITH MICROSCOPIC
Bacteria, UA: NONE SEEN
Bilirubin Urine: NEGATIVE
Glucose, UA: NEGATIVE mg/dL
Ketones, ur: NEGATIVE mg/dL
Nitrite: NEGATIVE
PH: 6 (ref 5.0–8.0)
Protein, ur: NEGATIVE mg/dL
SPECIFIC GRAVITY, URINE: 1.016 (ref 1.005–1.030)
SQUAMOUS EPITHELIAL / LPF: NONE SEEN (ref 0–5)

## 2017-10-05 LAB — TROPONIN I

## 2017-10-05 MED ORDER — HALOPERIDOL LACTATE 5 MG/ML IJ SOLN
2.5000 mg | Freq: Once | INTRAMUSCULAR | Status: AC
  Administered 2017-10-05: 2.5 mg via INTRAVENOUS
  Filled 2017-10-05: qty 1

## 2017-10-05 NOTE — ED Notes (Signed)
Report given to EMS

## 2017-10-05 NOTE — ED Notes (Signed)
ED Provider at bedside. 

## 2017-10-05 NOTE — ED Notes (Signed)
In and out cath performed by Sam RN, witnessed by [facility] RN

## 2017-10-05 NOTE — ED Provider Notes (Signed)
[facility] Emergency Department Provider Note  ____________________________________________   First MD Initiated Contact with Patient 10/05/17 1939     (approximate)  I have reviewed the triage vital signs and the nursing notes.   HISTORY  Chief Complaint Altered Mental Status  Level 5 exemption history limited by the patient's chronic dementia  HPI Kelly Lloyd is a 84 y.o. male comes to the emergency department via EMS from the memory care unit of his nursing home for an episode of left-sided weakness and slumping over to the left side.  The patient has a past medical history of severe dementia as well as remote strokes.  Apparently at some point today the patient was leaning to the left more than usual and had decreased strength in his left upper and left lower extremities.  When EMS arrived he was back to baseline.  Past Medical History:  Diagnosis Date  . Afib ([place])   . Alzheimer's dementia   . Atrial flutter ([place])    aflutter s/p RFCA  . Benign prostatic hypertrophy   . BPH (benign prostatic hyperplasia)   . Diverticulosis of colon   . Stroke ([place]) 10/17/2016   "can't use his left arm; LLE seems weaker" (10/21/2016)    Patient Active Problem List   Diagnosis Date Noted  . Cerebrovascular accident (CVA) due to embolism of right middle cerebral artery ([place]) 01/05/2017  . Advanced dementia 01/05/2017  . Stenosis of right carotid artery 01/05/2017  . Intracranial vascular stenosis 01/05/2017  . 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 Atrial flutter ([place])   . 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 Weight loss 10/18/2016  . Paroxysmal atrial fibrillation ([place]) 10/18/2016  . Status post catheter ablation of atrial fibrillation 10/18/2016  . Alzheimer's disease 10/18/2016  . Acute ischemic stroke (HCC) - R MCA scattered infarcts, unknown etiology 10/17/2016  . Anal stricture  04/17/2014  . Rectal pain 04/17/2014  . Unspecified constipation 08/30/2012  . Medicare annual wellness visit, initial 01/19/2012  . Medically noncompliant 01/19/2012  . Post-nasal drip 01/19/2012  . Hemorrhoid 06/24/2011  . PENILE LESION 02/12/2010  . 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 HYPERCHOLESTEROLEMIA 02/10/2010  . Memory loss 02/05/2009  . DIVERTICULOSIS, COLON 12/15/2006  . BENIGN PROSTATIC HYPERTROPHY 12/15/2006  . OSTEOARTHRITIS, GENERALIZED, MULTIPLE JOINTS 12/15/2006    Past Surgical History:  Procedure Laterality Date  . [place]  . ATRIAL FLUTTER ABLATION  02/07/2009   aflutter ablation. TEE EF 55% 02/06/09  . audiol eval  06/07/02  . BACK SURGERY  2008   "disc surgery; not sure what part of back"  . COLONOSCOPY  03/08/06   2 polyps, divertics, int ext hemorrhoids  . HEMORRHOID SURGERY  03/08/06   [place]  . [place]   at [facility] 09/01/2010  . HERNIA REPAIR  04/07/10   Dr. Bary Castilla  . [place]  . MRI INT  2/204   auit canals wnl except Mastoiditis  . TRANSURETHRAL RESECTION OF PROSTATE  2011    Prior to Admission medications   Medication Sig Start Date End Date Taking? Authorizing Provider  acetaminophen (TYLENOL) 325 MG tablet Take 650 mg by mouth every 6 (six) hours. May also take 2 tablets (650MG ) by mouth every 4 hours as needed for fever 101+ for up to 3 days   Yes [provider]  apixaban (ELIQUIS) 5  MG TABS tablet Take 1 tablet (5 mg total) by mouth 2 (two) times daily. 11/08/16  Yes Love, Ivan Anchors, PA-C  ENSURE PLUS (ENSURE PLUS) LIQD Take 237 mLs by mouth 3 (three) times daily between meals.   Yes [provider]  finasteride (PROSCAR) 5 MG tablet Take 5 mg by mouth daily.   Yes [provider]  Melatonin 5 MG TABS Take 5 mg by mouth at bedtime.    Yes [provider]  memantine (NAMENDA) 10 MG tablet Take 1 tablet  (10 mg total) by mouth 2 (two) times daily. 11/08/16  Yes Love, Ivan Anchors, PA-C  mirtazapine (REMERON) 7.5 MG tablet Take 7.5 mg by mouth at bedtime.   Yes [provider]  QUEtiapine (SEROQUEL) 25 MG tablet Take 12.5 mg by mouth 2 (two) times daily.   Yes [provider]  sulfamethoxazole-trimethoprim (BACTRIM DS,SEPTRA DS) 800-160 MG tablet Take 1 tablet by mouth 2 (two) times daily. 09/30/17 10/08/17 Yes [provider]  traZODone (DESYREL) 50 MG tablet Take 12.5 mg by mouth 3 (three) times daily. (after meals for aggressive behavior)   Yes [provider]  donepezil (ARICEPT) 10 MG tablet Take 1 tablet (10 mg total) by mouth daily. Patient not taking: Reported on 10/05/2017 11/08/16   Bary Leriche, PA-C    Allergies Patient has no known allergies.  Family History  Problem Relation Age of Onset  . Thyroid disease Unknown        family Hx  . Stroke Neg Hx        neg hx of CV  . Diabetes Neg Hx        DM; also neg hx of HBP  . Cancer Neg Hx        breast, ovarian, uterine  . Depression Neg Hx   . Alcohol abuse Neg Hx        no drug abuse     Social History Social History   Tobacco Use  . Smoking status: Former Smoker    Packs/day: 1.00    Years: 24.00    Pack years: 24.00    Last attempt to quit: 01/12/1974    Years since quitting: 43.7  . Smokeless tobacco: Never Used  . Tobacco comment: quit in 1975  Substance Use Topics  . Alcohol use: No    Comment: 10/21/2016 "used to have 1 beer/month; probably hasn't had one in the last year"  . Drug use: No    Review of Systems Level 5 exemption history limited by the patient's dementia ____________________________________________   PHYSICAL EXAM:  VITAL SIGNS: ED Triage Vitals  Enc Vitals Group     BP      Pulse      Resp      Temp      Temp src      SpO2      Weight      Height      Head Circumference      Peak Flow      Pain Score      Pain Loc      Pain Edu?      Excl. in  [place]?     Constitutional: Severe dementia.  Unable to tell me his name or where he is.  [place]les Eyes: PERRL EOMI. midrange and brisk Head: Atraumatic. Nose: No congestion/rhinnorhea. Mouth/Throat: No trismus Neck: No stridor.   Cardiovascular: Normal rate, regular rhythm. Grossly normal heart sounds.  Good peripheral circulation. Respiratory: Normal respiratory effort.  No retractions. Lungs CTAB and moving good air Gastrointestinal: Soft nontender Musculoskeletal: No lower extremity edema   Neurologic: Profound dementia.  Moves all 4 Skin:  Skin is warm, dry and intact. No rash noted. Psychiatric: Profound dementia   ____________________________________________   DIFFERENTIAL includes but not limited to  Stroke, TIA, urinary tract infection, dehydration, metabolic derangement, recrudescence ____________________________________________   LABS (all labs ordered are listed, but only abnormal results are displayed)  Labs Reviewed  URINALYSIS, COMPLETE (UACMP) WITH MICROSCOPIC - Abnormal; Notable for the following components:      Result Value   Color, Urine YELLOW (*)    APPearance CLEAR (*)    Hgb urine dipstick SMALL (*)    Leukocytes, UA TRACE (*)    All other components within normal limits  COMPREHENSIVE METABOLIC PANEL - Abnormal; Notable for the following components:   Glucose, Bld 101 (*)    BUN 26 (*)    Creatinine, Ser 1.46 (*)    AST 49 (*)    Alkaline Phosphatase 177 (*)    GFR calc non Af Amer 41 (*)    GFR calc Af Amer 48 (*)    All other components within normal limits  CBC WITH DIFFERENTIAL/PLATELET - Abnormal; Notable for the following components:   RBC 3.97 (*)    Hemoglobin 12.4 (*)    HCT 37.7 (*)    All other components within normal limits  TROPONIN I    Lab work reviewed by me with no acute etiology of his symptoms identified __________________________________________  EKG  ED ECG REPORT I, Darel Hong, the attending physician,  personally viewed and interpreted this ECG.  Date: 10/07/2017 EKG Time:  Rate: 95 Rhythm: Atrial fibrillation Intervals: normal ST/T Wave abnormalities: normal Narrative Interpretation: no evidence of acute ischemia  ____________________________________________  RADIOLOGY  Head CT reviewed by me with no acute disease ____________________________________________   PROCEDURES  Procedure(s) performed: no  Procedures  Critical Care performed: no  Observation: no ____________________________________________   INITIAL IMPRESSION / ASSESSMENT AND PLAN / ED COURSE  Pertinent labs & imaging results that were available during my care of the patient were reviewed by me and considered in my medical decision making (see chart for details).  The patient arrives with his wife and daughter at bedside.  We had a lengthy discussion regarding the possibility of intracerebral hemorrhage versus stroke versus TIA.  Head CT and out urinalysis and lab work are pending.  The patient's work-up is largely unremarkable.  His CT scan does show a remote right-sided infarct and it is entirely possible that his symptoms today could be a recrudescence of the previous versus a new TIA.  Regardless at this point given his advanced age and severe dementia and frequent falls I do not believe he would benefit from inpatient admission nor further anticoagulation.  Family verbalizes understanding and agree with the plan.  He is back to his baseline and he is stable for discharge.      ____________________________________________   FINAL CLINICAL IMPRESSION(S) / ED DIAGNOSES  Final diagnoses:  TIA (transient ischemic attack)      NEW MEDICATIONS STARTED DURING THIS VISIT:  Discharge Medication List as of 10/05/2017 10:51 PM       Note:  This document was prepared using Dragon voice recognition software and may include unintentional dictation errors.     Darel Hong, MD 10/07/17 [id number]

## 2017-10-05 NOTE — Discharge Instructions (Signed)
It was a pleasure to take care of you today, and thank you for coming to our emergency department.  If you have any questions or concerns before leaving please ask the nurse to grab me and I'm more than happy to go through your aftercare instructions again.  If you were prescribed any opioid pain medication today such as Norco, Vicodin, Percocet, morphine, hydrocodone, or oxycodone please make sure you do not drive when you are taking this medication as it can alter your ability to drive safely.  If you have any concerns once you are home that you are not improving or are in fact getting worse before you can make it to your follow-up appointment, please do not hesitate to call 911 and come back for further evaluation.  Kelly Hong, MD  Results for orders placed or performed during the hospital encounter of 10/05/17  Urinalysis, Complete w Microscopic  Result Value Ref Range   Color, Urine YELLOW (A) YELLOW   APPearance CLEAR (A) CLEAR   Specific Gravity, Urine 1.016 1.005 - 1.030   pH 6.0 5.0 - 8.0   Glucose, UA NEGATIVE NEGATIVE mg/dL   Hgb urine dipstick SMALL (A) NEGATIVE   Bilirubin Urine NEGATIVE NEGATIVE   Ketones, ur NEGATIVE NEGATIVE mg/dL   Protein, ur NEGATIVE NEGATIVE mg/dL   Nitrite NEGATIVE NEGATIVE   Leukocytes, UA TRACE (A) NEGATIVE   RBC / HPF 21-50 0 - 5 RBC/hpf   WBC, UA 6-10 0 - 5 WBC/hpf   Bacteria, UA NONE SEEN NONE SEEN   Squamous Epithelial / LPF NONE SEEN 0 - 5  Comprehensive metabolic panel  Result Value Ref Range   Sodium 136 135 - 145 mmol/L   Potassium 4.8 3.5 - 5.1 mmol/L   Chloride 101 101 - 111 mmol/L   CO2 25 22 - 32 mmol/L   Glucose, Bld 101 (H) 65 - 99 mg/dL   BUN 26 (H) 6 - 20 mg/dL   Creatinine, Ser 1.46 (H) 0.61 - 1.24 mg/dL   Calcium 9.3 8.9 - 10.3 mg/dL   Total Protein 7.8 6.5 - 8.1 g/dL   Albumin 3.6 3.5 - 5.0 g/dL   AST 49 (H) 15 - 41 U/L   ALT 27 17 - 63 U/L   Alkaline Phosphatase 177 (H) 38 - 126 U/L   Total Bilirubin 0.3 0.3 -  1.2 mg/dL   GFR calc non Af Amer 41 (L) >60 mL/min   GFR calc Af Amer 48 (L) >60 mL/min   Anion gap 10 5 - 15  Troponin I  Result Value Ref Range   Troponin I <0.03 <0.03 ng/mL  CBC with Differential  Result Value Ref Range   WBC 8.6 3.8 - 10.6 K/uL   RBC 3.97 (L) 4.40 - 5.90 MIL/uL   Hemoglobin 12.4 (L) 13.0 - 18.0 g/dL   HCT 37.7 (L) 40.0 - 52.0 %   MCV 94.8 80.0 - 100.0 fL   MCH 31.3 26.0 - 34.0 pg   MCHC 33.0 32.0 - 36.0 g/dL   RDW 13.0 11.5 - 14.5 %   Platelets 385 150 - 440 K/uL   Neutrophils Relative % 71 %   Neutro Abs 6.2 1.4 - 6.5 K/uL   Lymphocytes Relative 16 %   Lymphs Abs 1.4 1.0 - 3.6 K/uL   Monocytes Relative 10 %   Monocytes Absolute 0.9 0.2 - 1.0 K/uL   Eosinophils Relative 2 %   Eosinophils Absolute 0.2 0 - 0.7 K/uL   Basophils Relative 1 %  Basophils Absolute 0.1 0 - 0.1 K/uL   Ct Head Wo Contrast  Result Date: 10/05/2017 CLINICAL DATA:  Altered mental status. Found unresponsive after lunch. History of Alzheimer's disease, stroke. EXAM: CT HEAD WITHOUT CONTRAST TECHNIQUE: Contiguous axial images were obtained from the base of the skull through the vertex without intravenous contrast. COMPARISON:  CT HEAD July 26, 2017 FINDINGS: BRAIN: No intraparenchymal hemorrhage, mass effect nor midline shift. No acute large vascular territory infarct. RIGHT frontoparietal encephalomalacia. LEFT occipital lobe encephalomalacia. Moderate to severe parenchymal brain volume loss with disproportionate symmetric mesial temporal lobe atrophy. No abnormal extra-axial fluid collections. VASCULAR: Moderate calcific atherosclerosis of the carotid siphons and RIGHT vertebral artery. SKULL: No skull fracture. No significant scalp soft tissue swelling. SINUSES/ORBITS: Chronic RIGHT maxillary sinus opacification with expansion. Severe RIGHT frontal and RIGHT ethmoid sinusitis, likely postobstructive. Included mastoid air cells are well aerated. Soft tissue within the external auditory canals  most compatible with cerumen. The included ocular globes and orbital contents are non-suspicious. OTHER: None. IMPRESSION: 1. No acute intracranial process. 2. Old RIGHT frontoparietal/MCA territory infarct. Old LEFT occipital lobe/PCA territory infarct. 3. Moderate to severe parenchymal brain volume loss with disproportionate temporal lobe atrophy associated with neurodegenerative syndromes. 4. Obstructing RIGHT maxillary sinus mucocele. Electronically Signed   By: Elon Alas M.D.   On: 10/05/2017 20:42   Dg Chest Port 1 View  Result Date: 10/05/2017 CLINICAL DATA:  Cough.  Altered mental status. EXAM: PORTABLE CHEST 1 VIEW COMPARISON:  07/25/2017 FINDINGS: Heart size is normal. Aortic atherosclerosis. No evidence of pulmonary infiltrate or pleural effusion. IMPRESSION: No active disease. Electronically Signed   By: Earle Gell M.D.   On: 10/05/2017 20:11

## 2017-10-05 NOTE — ED Notes (Signed)
Patient's family left. Bed alarm placed. Fall mats placed.

## 2017-10-05 NOTE — ED Notes (Signed)
Reviewed discharge instructions, follow-up care, and prescriptions with patient's wife and caregiver Parke Simmers) from [facility]. Patient's wife and caregiver verbalized understanding of all information reviewed. Patient stable, with no distress noted at this time.

## 2017-10-05 NOTE — ED Triage Notes (Signed)
Patient's coming [facility] from memory care at [facility]. Per wife, patient was altered at lunch, not responding verbally to her. Per facility, at 3pm patient found in wheelchair slumped to the left side, not moving left side.   Patient now flexing/extending left side.

## 2017-10-08 ENCOUNTER — Emergency Department: Payer: Medicare Other

## 2017-10-08 ENCOUNTER — Emergency Department
Admission: EM | Admit: 2017-10-08 | Discharge: 2017-10-08 | Disposition: A | Discharge status: Skilled Nursing Facility | Payer: Medicare Other | Attending: Emergency Medicine | Admitting: Emergency Medicine

## 2017-10-08 DIAGNOSIS — L03116 Cellulitis of left lower limb: Secondary | ICD-10-CM | POA: Insufficient documentation

## 2017-10-08 DIAGNOSIS — T50905A Adverse effect of unspecified drugs, medicaments and biological substances, initial encounter: Secondary | ICD-10-CM | POA: Insufficient documentation

## 2017-10-08 DIAGNOSIS — F0390 Unspecified dementia without behavioral disturbance: Secondary | ICD-10-CM | POA: Insufficient documentation

## 2017-10-08 DIAGNOSIS — Z87891 Personal history of nicotine dependence: Secondary | ICD-10-CM | POA: Insufficient documentation

## 2017-10-08 DIAGNOSIS — R4182 Altered mental status, unspecified: Secondary | ICD-10-CM | POA: Diagnosis present

## 2017-10-08 DIAGNOSIS — Z79899 Other long term (current) drug therapy: Secondary | ICD-10-CM | POA: Diagnosis not present

## 2017-10-08 DIAGNOSIS — Z7901 Long term (current) use of anticoagulants: Secondary | ICD-10-CM | POA: Diagnosis not present

## 2017-10-08 DIAGNOSIS — R404 Transient alteration of awareness: Secondary | ICD-10-CM | POA: Diagnosis not present

## 2017-10-08 LAB — CBC WITH DIFFERENTIAL/PLATELET
BASOS ABS: 0.1 10*3/uL (ref 0–0.1)
Basophils Relative: 1 %
Eosinophils Absolute: 0.2 10*3/uL (ref 0–0.7)
Eosinophils Relative: 2 %
HEMATOCRIT: 40.2 % (ref 40.0–52.0)
HEMOGLOBIN: 13.3 g/dL (ref 13.0–18.0)
LYMPHS ABS: 1.6 10*3/uL (ref 1.0–3.6)
LYMPHS PCT: 19 %
MCH: 31.4 pg (ref 26.0–34.0)
MCHC: 33.1 g/dL (ref 32.0–36.0)
MCV: 94.8 fL (ref 80.0–100.0)
Monocytes Absolute: 0.6 10*3/uL (ref 0.2–1.0)
Monocytes Relative: 7 %
NEUTROS ABS: 6.1 10*3/uL (ref 1.4–6.5)
Neutrophils Relative %: 71 %
Platelets: 405 10*3/uL (ref 150–440)
RBC: 4.23 MIL/uL — AB (ref 4.40–5.90)
RDW: 13 % (ref 11.5–14.5)
WBC: 8.6 10*3/uL (ref 3.8–10.6)

## 2017-10-08 LAB — COMPREHENSIVE METABOLIC PANEL
ALK PHOS: 173 U/L — AB (ref 38–126)
ALT: 57 U/L (ref 17–63)
AST: 84 U/L — ABNORMAL HIGH (ref 15–41)
Albumin: 3.8 g/dL (ref 3.5–5.0)
Anion gap: 7 (ref 5–15)
BILIRUBIN TOTAL: 0.6 mg/dL (ref 0.3–1.2)
BUN: 19 mg/dL (ref 6–20)
CALCIUM: 9.5 mg/dL (ref 8.9–10.3)
CHLORIDE: 103 mmol/L (ref 101–111)
CO2: 27 mmol/L (ref 22–32)
CREATININE: 1.16 mg/dL (ref 0.61–1.24)
GFR calc Af Amer: >60 mL/min (ref >60)
GFR, EST NON AFRICAN AMERICAN: 54 mL/min — AB (ref >60)
Glucose, Bld: 118 mg/dL — ABNORMAL HIGH (ref 65–99)
Potassium: 4.5 mmol/L (ref 3.5–5.1)
Sodium: 137 mmol/L (ref 135–145)
TOTAL PROTEIN: 7.8 g/dL (ref 6.5–8.1)

## 2017-10-08 LAB — TROPONIN I

## 2017-10-08 MED ORDER — CEPHALEXIN 500 MG PO CAPS
500.0000 mg | ORAL_CAPSULE | Freq: Once | ORAL | Status: AC
  Administered 2017-10-08: 500 mg via ORAL
  Filled 2017-10-08: qty 1

## 2017-10-08 MED ORDER — CEPHALEXIN 500 MG PO CAPS: 500.0000 mg | ORAL_CAPSULE | Freq: Three times a day (TID) | ORAL | 0 refills | Status: AC

## 2017-10-08 NOTE — ED Notes (Signed)
Pt's son Haward Pope signed consent for D/C at this time. Denies comments/concerns regarding D/C at this time.

## 2017-10-08 NOTE — ED Triage Notes (Signed)
Pt presents today from [facility] where they state he has just slept in sunroom all day. EMS states meds have been given as it is charted. Pt is arousal but lethargic in between at this time.

## 2017-10-08 NOTE — ED Notes (Signed)
Pt changed and placed into clean brief by this RN, Mickel Baas, EDT and Wilfred Lacy, EDT.Cellulitis to L ankle marked by this RN. Clean dressing applied to 2nd degree skin breakdown by this RN. Pt given clean blankets at this time.

## 2017-10-08 NOTE — ED Notes (Signed)
Pt sitting up in bed eating apple sauce and being fed by his granddaughter.

## 2017-10-08 NOTE — ED Notes (Signed)
EKG completed Lm edt

## 2017-10-08 NOTE — ED Notes (Signed)
Pt given a sandwich tray at this time, noted to be more alert. Pt's family remains at bedside. This RN spoke with [place] from [place] requesting something "stronger than tylenol" for pain, this RN spoke with MD, per MD due to patient initial sedation and pt not appearing to be uncomfortable, no pain medication would be written. This RN reviewed D/C instructions with patient's family member who remains at bedside and [place] from [facility].

## 2017-10-08 NOTE — ED Provider Notes (Addendum)
[facility] Emergency Department Provider Note  ____________________________________________  Time seen: Approximately 3:33 PM  I have reviewed the triage vital signs and the nursing notes.   HISTORY  Chief Complaint Altered Mental Status  Level 5 caveat:  Portions of the history and physical were unable to be obtained due to AMS and drowsiness   HPI Kelly Lloyd is a 84 y.o. male with h/o CVA with L sided weakness, afib on Eliquis, dementia who presents for evaluation of altered mental status.  According to the nursing home patient has been sleeping all day in the sun room.  Review of patient's medical record from the nursing home seems like patient has received 2 rounds of trazodone today 1 at 8 AM and the other one at 12 noon.  This medication is prescribed as needed for aggressive behavior with instructions to hold it for drowsiness. Patient will open his eyes to sternal rub and smile but not answer to any questions.  He is moving all 4 extremities voluntarily.  There is no evidence of trauma  Past Medical History:  Diagnosis Date  . Afib ([place])   . Alzheimer's dementia   . Atrial flutter ([place])    aflutter s/p RFCA  . Benign prostatic hypertrophy   . BPH (benign prostatic hyperplasia)   . Diverticulosis of colon   . Stroke ([place]) 10/17/2016   "can't use his left arm; LLE seems weaker" (10/21/2016)    Patient Active Problem List   Diagnosis Date Noted  . Cerebrovascular accident (CVA) due to embolism of right middle cerebral artery ([place]) 01/05/2017  . Advanced dementia 01/05/2017  . Stenosis of right carotid artery 01/05/2017  . Intracranial vascular stenosis 01/05/2017  . Right middle cerebral artery stroke ([place]) 10/21/2016  . Atrial flutter ([place])   . 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 Weight loss 10/18/2016  . Paroxysmal atrial fibrillation ([place]) 10/18/2016  . Status post catheter  ablation of atrial fibrillation 10/18/2016  . Alzheimer's disease 10/18/2016  . Acute ischemic stroke (HCC) - R MCA scattered infarcts, unknown etiology 10/17/2016  . Anal stricture 04/17/2014  . Rectal pain 04/17/2014  . Unspecified constipation 08/30/2012  . Medicare annual wellness visit, initial 01/19/2012  . Medically noncompliant 01/19/2012  . Post-nasal drip 01/19/2012  . Hemorrhoid 06/24/2011  . PENILE LESION 02/12/2010  . 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 HYPERCHOLESTEROLEMIA 02/10/2010  . Memory loss 02/05/2009  . DIVERTICULOSIS, COLON 12/15/2006  . BENIGN PROSTATIC HYPERTROPHY 12/15/2006  . OSTEOARTHRITIS, GENERALIZED, MULTIPLE JOINTS 12/15/2006    Past Surgical History:  Procedure Laterality Date  . [place]  . ATRIAL FLUTTER ABLATION  02/07/2009   aflutter ablation. TEE EF 55% 02/06/09  . audiol eval  06/07/02  . BACK SURGERY  2008   "disc surgery; not sure what part of back"  . COLONOSCOPY  03/08/06   2 polyps, divertics, int ext hemorrhoids  . HEMORRHOID SURGERY  03/08/06   [place]  . [facility]   at [facility]/ [facility] 09/01/2010  . HERNIA REPAIR  04/07/10   Dr. Bary Castilla  . [place]  . MRI INT  2/204   auit canals wnl except Mastoiditis  . TRANSURETHRAL RESECTION OF PROSTATE  2011    Prior to Admission medications   Medication Sig Start Date End Date Taking? Authorizing Provider  acetaminophen (TYLENOL) 325 MG tablet Take 650 mg by mouth every  6 (six) hours. May also take 2 tablets (650MG ) by mouth every 4 hours as needed for fever 101+ for up to 3 days   Yes [provider]  apixaban (ELIQUIS) 5 MG TABS tablet Take 1 tablet (5 mg total) by mouth 2 (two) times daily. 11/08/16  Yes Love, Ivan Anchors, PA-C  ENSURE PLUS (ENSURE PLUS) LIQD Take 237 mLs by mouth 3 (three) times daily between meals.   Yes [provider]  finasteride (PROSCAR) 5 MG tablet Take 5  mg by mouth daily.   Yes [provider]  Melatonin 5 MG TABS Take 5 mg by mouth at bedtime.    Yes [provider]  memantine (NAMENDA) 10 MG tablet Take 1 tablet (10 mg total) by mouth 2 (two) times daily. 11/08/16  Yes Love, Ivan Anchors, PA-C  mirtazapine (REMERON) 7.5 MG tablet Take 7.5 mg by mouth at bedtime.   Yes [provider]  QUEtiapine (SEROQUEL) 25 MG tablet Take 12.5 mg by mouth 2 (two) times daily.   Yes [provider]  sulfamethoxazole-trimethoprim (BACTRIM DS,SEPTRA DS) 800-160 MG tablet Take 1 tablet by mouth 2 (two) times daily. 09/30/17 10/08/17 Yes [provider]  traZODone (DESYREL) 50 MG tablet Take 12.5 mg by mouth 3 (three) times daily. (after meals for aggressive behavior) Hold if drowsy.   Yes [provider]  cephALEXin (KEFLEX) 500 MG capsule Take 1 capsule (500 mg total) by mouth 3 (three) times daily for 7 days. 10/08/17 10/15/17  Rudene Re, MD  donepezil (ARICEPT) 10 MG tablet Take 1 tablet (10 mg total) by mouth daily. Patient not taking: Reported on 10/05/2017 11/08/16   Bary Leriche, PA-C    Allergies Patient has no known allergies.  Family History  Problem Relation Age of Onset  . Thyroid disease Unknown        family Hx  . Stroke Neg Hx        neg hx of CV  . Diabetes Neg Hx        DM; also neg hx of HBP  . Cancer Neg Hx        breast, ovarian, uterine  . Depression Neg Hx   . Alcohol abuse Neg Hx        no drug abuse     Social History Social History   Tobacco Use  . Smoking status: Former Smoker    Packs/day: 1.00    Years: 24.00    Pack years: 24.00    Last attempt to quit: 01/12/1974    Years since quitting: 43.7  . Smokeless tobacco: Never Used  . Tobacco comment: quit in 1975  Substance Use Topics  . Alcohol use: No    Comment: 10/21/2016 "used to have 1 beer/month; probably hasn't had one in the last year"  . Drug use: No    Review of Systems  Constitutional: Negative  for fever. + AMS  Level 5 caveat:  Portions of the history and physical were unable to be obtained due to AMS  ____________________________________________   PHYSICAL EXAM:  VITAL SIGNS: ED Triage Vitals  Enc Vitals Group     BP 10/08/17 1522 137/60     Pulse Rate 10/08/17 1522 85     Resp --      Temp 10/08/17 1522 98.3 F (36.8 C)     Temp Source 10/08/17 1522 Oral     SpO2 10/08/17 1522 100 %     Weight 10/08/17 1524 144 lb (65.3 kg)  Height 10/08/17 1524 6' (1.829 m)     Head Circumference --      Peak Flow --      Pain Score --      Pain Loc --      Pain Edu? --      Excl. in [place]? --     Constitutional: Sleepy, arousable to sternal rub, no distress HEENT:      Head: Normocephalic and atraumatic.         Eyes: Conjunctivae are normal. Sclera is non-icteric.       Mouth/Throat: Mucous membranes are moist.       Neck: Supple with no signs of meningismus. Cardiovascular: Regular rate and rhythm. No murmurs, gallops, or rubs. 2+ symmetrical distal pulses are present in all extremities. No JVD. Respiratory: Normal respiratory effort. Lungs are clear to auscultation bilaterally. No wheezes, crackles, or rhonchi.  Gastrointestinal: Soft, non tender, and non distended with positive bowel sounds. No rebound or guarding. Musculoskeletal: Patient has a small ulceration over the left lateral malleolus with a small area of surrounding cellulitis for Neurologic: Opens eyes to sternal rub, smiles and falls back to sleep, moving all extremities spontaneously Skin: Skin is warm, dry and intact. No rash noted.  ____________________________________________   LABS (all labs ordered are listed, but only abnormal results are displayed)  Labs Reviewed  CBC WITH DIFFERENTIAL/PLATELET - Abnormal; Notable for the following components:      Result Value   RBC 4.23 (*)    All other components within normal limits  COMPREHENSIVE METABOLIC PANEL - Abnormal; Notable for the following  components:   Glucose, Bld 118 (*)    AST 84 (*)    Alkaline Phosphatase 173 (*)    GFR calc non Af Amer 54 (*)    All other components within normal limits  TROPONIN I  URINALYSIS, COMPLETE (UACMP) WITH MICROSCOPIC   ____________________________________________  EKG  ED ECG REPORT I, Rudene Re, the attending physician, personally viewed and interpreted this ECG.  Atrial fibrillation, rate of 96, right bundle branch block, prolonged QTC, no ST elevations or depressions.  EKG interpretation is limited due to significant artifact.  Unchanged from prior. ____________________________________________  RADIOLOGY  I have personally reviewed the images performed during this visit and I agree with the Radiologist's read.   Interpretation by Radiologist:  Ct Head Wo Contrast  Result Date: 10/08/2017 CLINICAL DATA:  Altered level of consciousness. EXAM: CT HEAD WITHOUT CONTRAST TECHNIQUE: Contiguous axial images were obtained from the base of the skull through the vertex without intravenous contrast. COMPARISON:  CT scan of Sep 04, 2017. FINDINGS: Brain: Mild diffuse cortical atrophy is noted. Mild chronic ischemic white matter disease is noted. Old right parietal infarction is noted. No mass effect or midline shift is noted. Ventricular size is within normal limits. There is no evidence of mass lesion, hemorrhage or acute infarction. Vascular: No hyperdense vessel or unexpected calcification. Skull: Normal. Negative for fracture or focal lesion. Sinuses/Orbits: There is again noted probable right maxillary mucocele with resulting right ethmoid and frontal sinusitis. Other: None. IMPRESSION: Mild diffuse cortical atrophy. Mild chronic ischemic white matter disease. Old right parietal infarction. No acute intracranial abnormality seen. Stable right maxillary mucocele is noted resulting in right ethmoid and frontal sinusitis. Electronically Signed   By: Marijo Conception, M.D.   On: 10/08/2017  16:19      ____________________________________________   PROCEDURES  Procedure(s) performed: None Procedures Critical Care performed:  None ____________________________________________   INITIAL IMPRESSION /  ASSESSMENT AND PLAN / ED COURSE  84 y.o. male with h/o CVA with L sided weakness, afib on Eliquis, dementia who presents for evaluation of altered mental status.  According to the nursing home patient has been sleeping all day.  Did not eat his lunch.  Review of patient's MAR which came with him from the nursing home shows the patient has received 2 doses of trazodone today which is written for him as needed for aggressive behavior.  It is also in the instructions to hold for drowsiness.  I spoke with the nurse supervisor at [facility] and asked why he was given this medication if he had been drowsy all day long.  She did not have an answer for me. I reinforced with her that this medication is prescribed as needed for agitation and to hold for drowsiness. Will do labs, UA, and head CT to rule out alternative causes of patient's AMS such as dehydration, acute kidney injury, electrolyte abnormalities, urinary tract infection, or stroke.  Will monitor patient until he is more awake.    _________________________ 5:53 PM on 10/08/2017 -----------------------------------------  After several hours of monitoring the emergency room patient is now awake and back to his baseline with no signs of stroke or any acute encephalopathy.  His wife and granddaughter at the bedside.  Discussed with them my concerns about patient receiving several doses of trazodone even though he was drowsy today. They will also address this issue with the nursing home. Patient had UA done 3 days ago and family has refused repeat UA. Labs and CT head with no acute findings. Patient is now stable for dc back to SNF. I am providing instructions for administration of trazodone in written as well.     Patient has an ulcer  over the L lateral malleolus with small area of surrounding of cellulitis. The area was demarcated and patient was started on keflex. Instructions for wound care and abx discussed with nursing home.   As part of my medical decision making, I reviewed the following data within the [place] History obtained from family, Nursing notes reviewed and incorporated, Labs reviewed , EKG interpreted , Old chart reviewed, Radiograph reviewed , Notes from prior ED visits and [place] Controlled Substance Database    Pertinent labs & imaging results that were available during my care of the patient were reviewed by me and considered in my medical decision making (see chart for details).    ____________________________________________   FINAL CLINICAL IMPRESSION(S) / ED DIAGNOSES  Final diagnoses:  Sedated due to medication  Cellulitis of left lower extremity      NEW MEDICATIONS STARTED DURING THIS VISIT:  ED Discharge Orders        Ordered    cephALEXin (KEFLEX) 500 MG capsule  3 times daily     10/08/17 1801       Note:  This document was prepared using Dragon voice recognition software and may include unintentional dictation errors.    Alfred Levins, [place], MD 10/08/17 [place], [place], MD 10/08/17 [phone]

## 2017-10-08 NOTE — ED Notes (Signed)
Report from [place], rn. Awaiting ems for transport back to [facility].

## 2017-10-08 NOTE — Discharge Instructions (Addendum)
Altered mental status: Per instructions on patient's MAR trazodone is to be given for aggressive behavior and held for sedation. Make sure to follow these indications given by patient's doctor.  Return to the ER for altered mental status, chest pain, abdominal pain, shortness of breath or fever.  Cellulitis in the L ankle: Follow-up with your doctor in 24-48 hours for a re-check. If you noticed that redness is expanding, if you develop a fever, or if your symptoms are worsening follow up with your doctor immediately or return to the ER as you may need different antibiotic.

## 2017-11-29 DIAGNOSIS — L97329 Non-pressure chronic ulcer of left ankle with unspecified severity: Secondary | ICD-10-CM

## 2017-11-29 DIAGNOSIS — G8194 Hemiplegia, unspecified affecting left nondominant side: Secondary | ICD-10-CM

## 2017-11-29 DIAGNOSIS — G301 Alzheimer's disease with late onset: Secondary | ICD-10-CM

## 2017-11-29 DIAGNOSIS — E440 Moderate protein-calorie malnutrition: Secondary | ICD-10-CM

## 2017-11-29 DIAGNOSIS — F39 Unspecified mood [affective] disorder: Secondary | ICD-10-CM

## 2017-11-29 DIAGNOSIS — I501 Left ventricular failure: Secondary | ICD-10-CM

## 2017-12-12 DIAGNOSIS — E43 Unspecified severe protein-calorie malnutrition: Secondary | ICD-10-CM

## 2017-12-12 DIAGNOSIS — N401 Enlarged prostate with lower urinary tract symptoms: Secondary | ICD-10-CM

## 2017-12-12 DIAGNOSIS — L97329 Non-pressure chronic ulcer of left ankle with unspecified severity: Secondary | ICD-10-CM

## 2017-12-12 DIAGNOSIS — F39 Unspecified mood [affective] disorder: Secondary | ICD-10-CM

## 2017-12-12 DIAGNOSIS — R4181 Age-related cognitive decline: Secondary | ICD-10-CM

## 2017-12-12 DIAGNOSIS — I69398 Other sequelae of cerebral infarction: Secondary | ICD-10-CM

## 2017-12-12 DIAGNOSIS — I4891 Unspecified atrial fibrillation: Secondary | ICD-10-CM

## 2017-12-12 DIAGNOSIS — G309 Alzheimer's disease, unspecified: Secondary | ICD-10-CM | POA: Diagnosis not present

## 2017-12-18 DEATH — deceased

## 2018-10-03 IMAGING — CT CT CERVICAL SPINE W/O CM
4 of 9 series · 12 of 33 positions shown, 13 images · non-contrast
Comparison: 05/19/2017 head CT.  10/19/2016 neck CT angiogram.

CLINICAL DATA: Unwitnessed fall. Possible head injury.
Anticoagulated. Dementia.

EXAM:
CT HEAD WITHOUT CONTRAST
CT CERVICAL SPINE WITHOUT CONTRAST
TECHNIQUE: Multidetector CT imaging of the head and cervical spine was
performed following the standard protocol without intravenous
contrast. Multiplanar CT image reconstructions of the cervical spine
were also generated.

[Series 9: c spine soft · axial · 0.32mm/px · z∈[-290,-226]mm · 2 of 92 slices shown]
[im 31/92  soft-tissue]
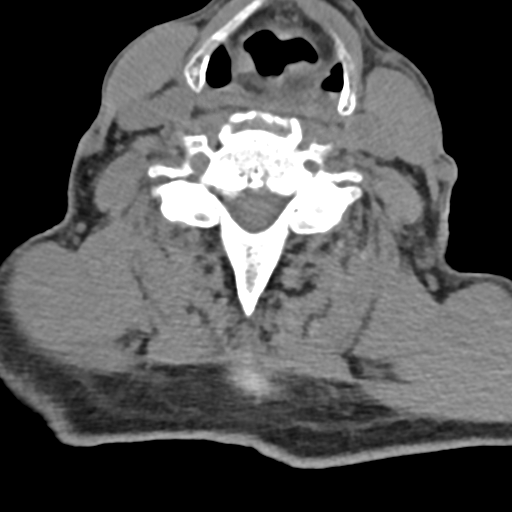
[im 61/92  soft-tissue]
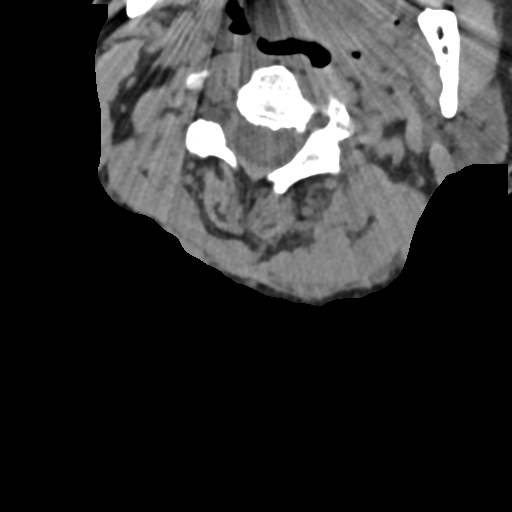

[Series 12: sagittal bone · sagittal · 0.35mm/px · 5 of 80 slices shown]
[im 12/80  bone]
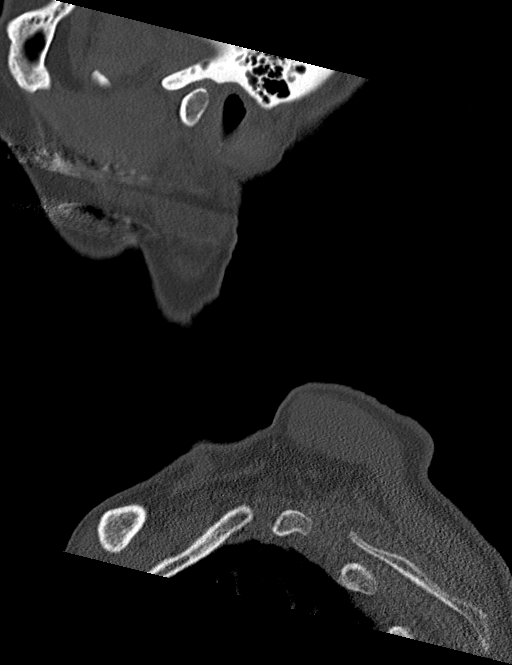
[im 23/80  bone]
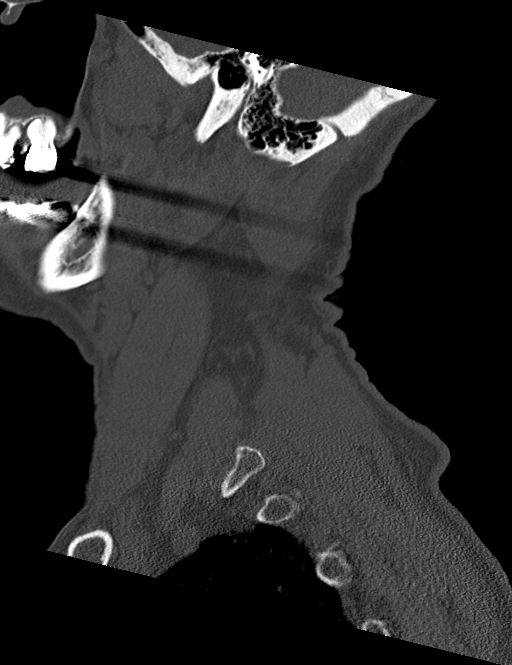
[im 34/80  bone]
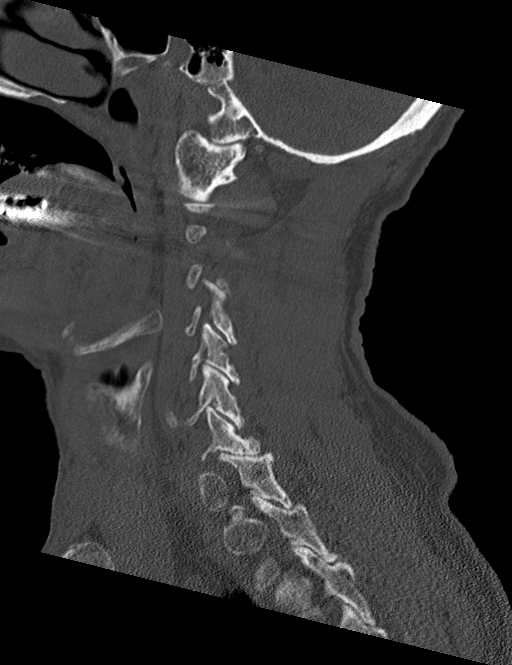
[im 46/80  bone]
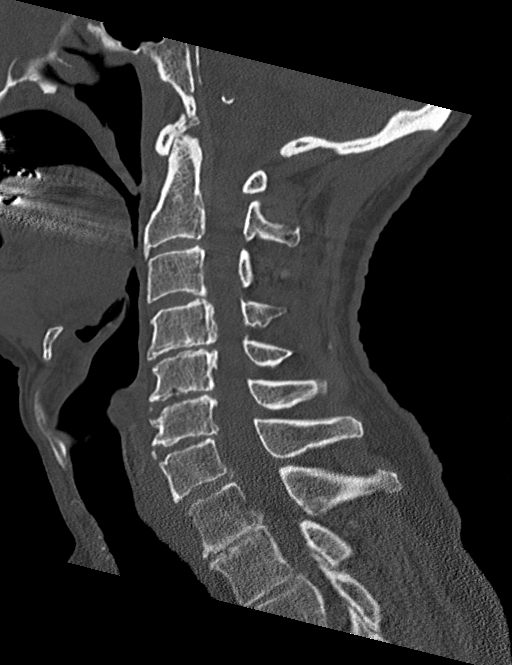
[im 57/80  bone]
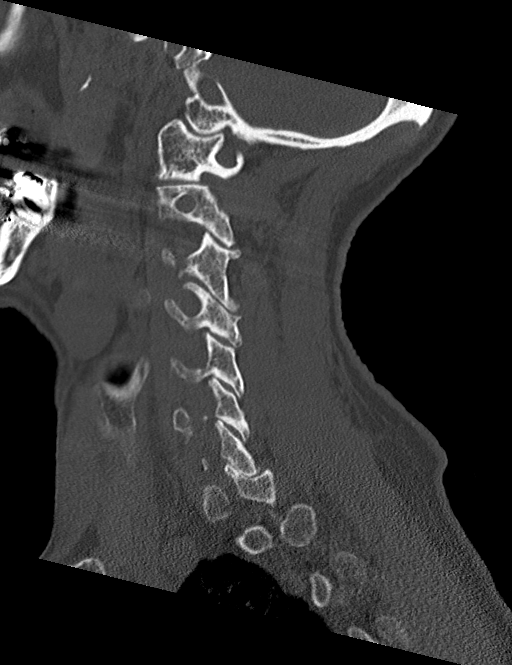

[Series 13: coronal bone · coronal · 0.35mm/px · 3 of 96 slices shown]
[im 24/96  bone]
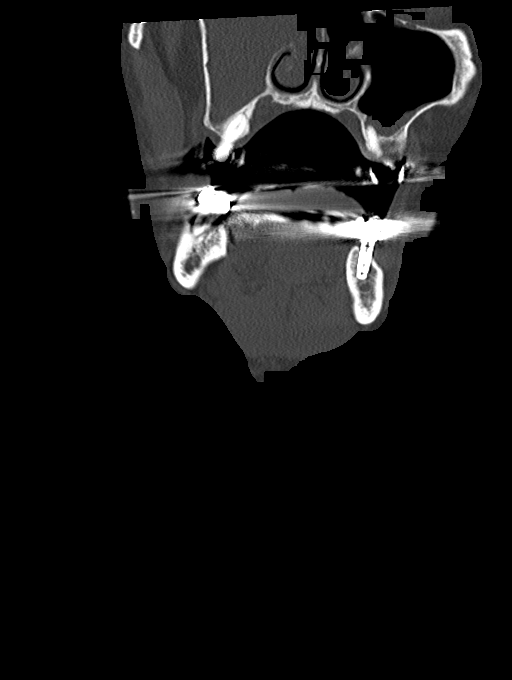
[im 48/96  bone]
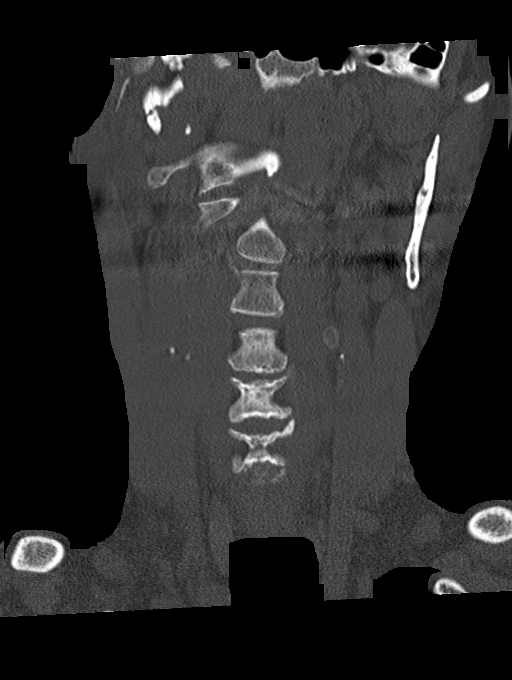
[im 72/96  bone]
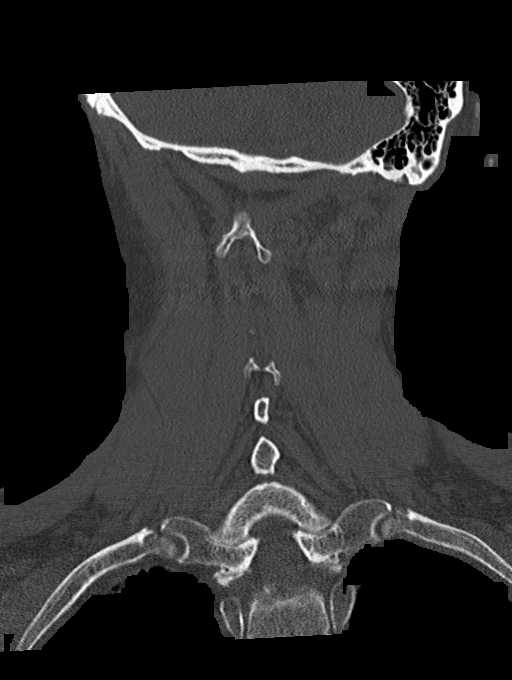

[Series 14: orthogonal bone · axial · 0.30mm/px · z∈[-313,-242]mm · 2 of 111 slices shown, 3 images]
[im 37/111  soft-tissue]
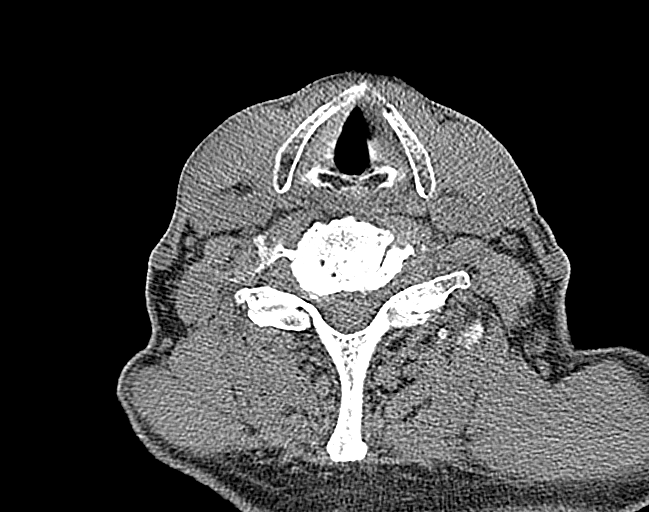
[im 37/111  bone]
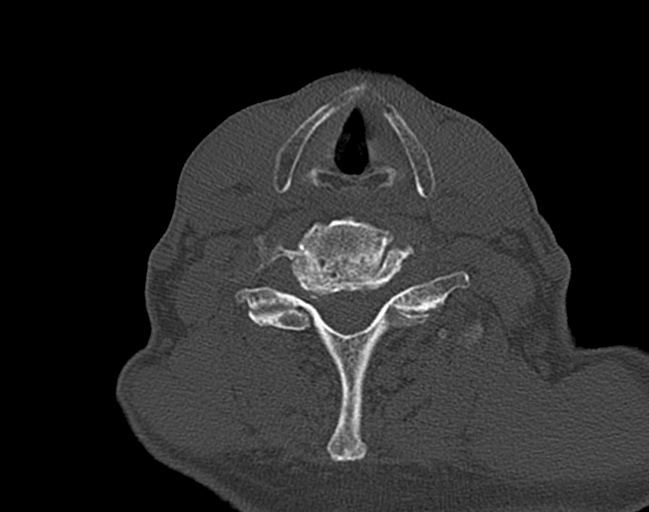
[im 74/111  bone]
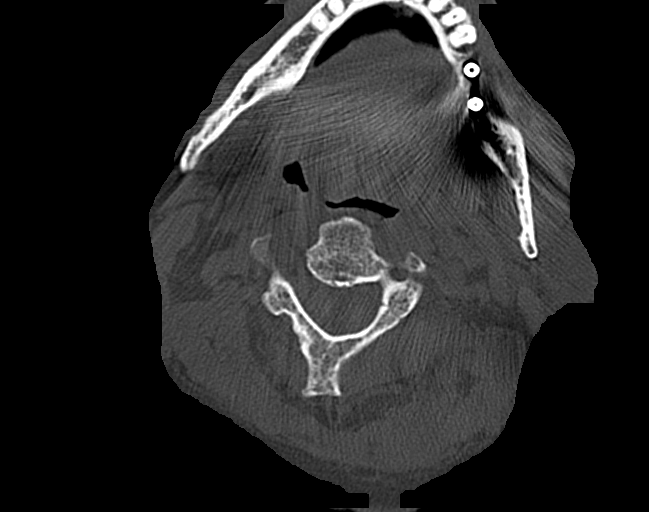

[12 of 33 positions shown; findings below may reference images not displayed]

FINDINGS: CT HEAD FINDINGS

Brain: Stable right frontoparietal encephalomalacia. No evidence of
parenchymal hemorrhage or extra-axial fluid collection. No mass
lesion, mass effect, or midline shift. No CT evidence of acute
infarction. Generalized cerebral volume loss. Nonspecific mild to
moderate subcortical and periventricular white matter hypodensity,
most in keeping with chronic small vessel ischemic change. Cerebral
ventricle sizes are stable and concordant with the degree of
cerebral volume loss.

Vascular: No acute abnormality.

Skull: No evidence of calvarial fracture.

Sinuses/Orbits: Complete opacification of the right maxillary and
right frontal sinuses. Near complete opacification of the right
ethmoidal air cells. Partial opacification of the left ethmoidal air
cells. Asymmetric hyperostosis of the right maxillary sinus walls.
These findings are not appreciably changed. No fluid levels.

Other:  The mastoid air cells are unopacified.

CT CERVICAL SPINE FINDINGS

Alignment: Mild straightening of the cervical spine. No facet
subluxation. Dens is well positioned between the lateral masses of
C1.

Skull base and vertebrae: No acute fracture. No primary bone lesion
or focal pathologic process.

Soft tissues and spinal canal: No prevertebral edema. No visible
canal hematoma.

Disc levels: Marked multilevel cervical degenerative disc disease.
Mild-to-moderate bilateral facet arthropathy. Mild degenerative
foraminal stenosis on the left at C2-3. Moderate degenerative
foraminal stenosis on the right at C3-4. Moderate to severe right
and severe left degenerative foraminal stenosis at C4-5. Mild
bilateral degenerative foraminal stenosis at C5-6. Mild-to-moderate
right degenerative foraminal stenosis at C6-7.

Upper chest: No acute abnormality.

Other: Visualized mastoid air cells appear clear. No discrete
thyroid nodules. No pathologically enlarged cervical nodes.
IMPRESSION: CT HEAD:

1. No evidence of acute intracranial abnormality. No evidence of
calvarial fracture.
2. Stable right frontoparietal encephalomalacia.
3. Generalized cerebral volume loss. Chronic small vessel ischemic
changes in the cerebral white matter.
4. Prominent chronic right paranasal sinusitis.

CT CERVICAL SPINE:

1. No cervical spine fracture or subluxation.
2. Advanced multilevel degenerative changes in the cervical spine as
detailed.
